# Patient Record
Sex: Female | Born: 1945
Health system: Southern US, Community
[De-identification: ages and names within clinical notes are randomized; demographics above are authoritative.]

## PROBLEM LIST (undated history)

## (undated) DIAGNOSIS — E663 Overweight: Secondary | ICD-10-CM

## (undated) DIAGNOSIS — Z780 Asymptomatic menopausal state: Secondary | ICD-10-CM

## (undated) DIAGNOSIS — R918 Other nonspecific abnormal finding of lung field: Secondary | ICD-10-CM

## (undated) DIAGNOSIS — K219 Gastro-esophageal reflux disease without esophagitis: Secondary | ICD-10-CM

## (undated) DIAGNOSIS — F419 Anxiety disorder, unspecified: Secondary | ICD-10-CM

## (undated) DIAGNOSIS — R7301 Impaired fasting glucose: Secondary | ICD-10-CM

## (undated) DIAGNOSIS — R7303 Prediabetes: Secondary | ICD-10-CM

## (undated) DIAGNOSIS — F99 Mental disorder, not otherwise specified: Secondary | ICD-10-CM

## (undated) DIAGNOSIS — K649 Unspecified hemorrhoids: Secondary | ICD-10-CM

## (undated) DIAGNOSIS — K5900 Constipation, unspecified: Secondary | ICD-10-CM

## (undated) DIAGNOSIS — R350 Frequency of micturition: Secondary | ICD-10-CM

## (undated) DIAGNOSIS — I10 Essential (primary) hypertension: Secondary | ICD-10-CM

## (undated) DIAGNOSIS — M779 Enthesopathy, unspecified: Secondary | ICD-10-CM

## (undated) DIAGNOSIS — F411 Generalized anxiety disorder: Secondary | ICD-10-CM

## (undated) DIAGNOSIS — E785 Hyperlipidemia, unspecified: Secondary | ICD-10-CM

## (undated) DIAGNOSIS — R319 Hematuria, unspecified: Secondary | ICD-10-CM

## (undated) DIAGNOSIS — I82409 Acute embolism and thrombosis of unspecified deep veins of unspecified lower extremity: Secondary | ICD-10-CM

## (undated) HISTORY — DX: Impaired fasting glucose: R73.01

## (undated) HISTORY — PX: ABDOMINAL HYSTERECTOMY: SHX81

## (undated) HISTORY — DX: Generalized anxiety disorder: F41.1

## (undated) HISTORY — DX: Acute embolism and thrombosis of unspecified deep veins of unspecified lower extremity: I82.409

## (undated) HISTORY — DX: Hyperlipidemia, unspecified: E78.5

## (undated) HISTORY — DX: Essential (primary) hypertension: I10

## (undated) HISTORY — PX: BREAST BIOPSY: SHX20

## (undated) HISTORY — DX: Unspecified hemorrhoids: K64.9

## (undated) HISTORY — DX: Constipation, unspecified: K59.00

## (undated) HISTORY — DX: Enthesopathy, unspecified: M77.9

## (undated) HISTORY — DX: Overweight: E66.3

## (undated) HISTORY — DX: Asymptomatic menopausal state: Z78.0

## (undated) HISTORY — DX: Frequency of micturition: R35.0

## (undated) HISTORY — DX: Other nonspecific abnormal finding of lung field: R91.8

## (undated) HISTORY — DX: Anxiety disorder, unspecified: F41.9

## (undated) HISTORY — DX: Hematuria, unspecified: R31.9

## (undated) HISTORY — PX: BREAST SURGERY: SHX581

---
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
## 1976-02-14 HISTORY — PX: BREAST EXCISIONAL BIOPSY: SUR124

## 2002-02-13 HISTORY — PX: COLONOSCOPY: SHX174

## 2005-04-03 ENCOUNTER — Ambulatory Visit: Payer: Self-pay | Admitting: Orthopedic Surgery

## 2007-01-25 ENCOUNTER — Encounter (HOSPITAL_COMMUNITY): Admission: RE | Admit: 2007-01-25 | Discharge: 2007-02-13 | Payer: Self-pay | Admitting: Family Medicine

## 2007-02-25 ENCOUNTER — Ambulatory Visit (HOSPITAL_COMMUNITY): Admission: RE | Admit: 2007-02-25 | Discharge: 2007-02-25 | Payer: Self-pay | Admitting: Family Medicine

## 2007-03-11 ENCOUNTER — Ambulatory Visit (HOSPITAL_COMMUNITY): Payer: Self-pay | Admitting: Oncology

## 2007-04-26 ENCOUNTER — Ambulatory Visit (HOSPITAL_COMMUNITY): Admission: RE | Admit: 2007-04-26 | Discharge: 2007-04-26 | Payer: Self-pay | Admitting: Family Medicine

## 2007-06-03 ENCOUNTER — Other Ambulatory Visit: Admission: RE | Admit: 2007-06-03 | Discharge: 2007-06-03 | Payer: Self-pay | Admitting: Obstetrics and Gynecology

## 2007-08-26 ENCOUNTER — Ambulatory Visit (HOSPITAL_COMMUNITY): Payer: Self-pay | Admitting: Oncology

## 2007-09-05 ENCOUNTER — Ambulatory Visit (HOSPITAL_COMMUNITY): Admission: RE | Admit: 2007-09-05 | Discharge: 2007-09-05 | Payer: Self-pay | Admitting: Family Medicine

## 2007-09-18 ENCOUNTER — Ambulatory Visit: Payer: Self-pay | Admitting: Gastroenterology

## 2008-02-11 ENCOUNTER — Ambulatory Visit (HOSPITAL_COMMUNITY): Payer: Self-pay | Admitting: Oncology

## 2008-02-11 ENCOUNTER — Encounter (HOSPITAL_COMMUNITY): Admission: RE | Admit: 2008-02-11 | Discharge: 2008-03-12 | Payer: Self-pay | Admitting: Oncology

## 2008-03-19 ENCOUNTER — Ambulatory Visit (HOSPITAL_COMMUNITY): Admission: RE | Admit: 2008-03-19 | Discharge: 2008-03-19 | Payer: Self-pay | Admitting: Family Medicine

## 2008-07-02 ENCOUNTER — Ambulatory Visit (HOSPITAL_COMMUNITY): Admission: RE | Admit: 2008-07-02 | Discharge: 2008-07-02 | Payer: Self-pay | Admitting: Family Medicine

## 2008-07-08 ENCOUNTER — Encounter (INDEPENDENT_AMBULATORY_CARE_PROVIDER_SITE_OTHER): Payer: Self-pay | Admitting: *Deleted

## 2008-08-04 ENCOUNTER — Telehealth (INDEPENDENT_AMBULATORY_CARE_PROVIDER_SITE_OTHER): Payer: Self-pay | Admitting: *Deleted

## 2008-08-04 ENCOUNTER — Ambulatory Visit: Payer: Self-pay | Admitting: Thoracic Surgery

## 2008-08-27 DIAGNOSIS — R634 Abnormal weight loss: Secondary | ICD-10-CM

## 2008-08-28 ENCOUNTER — Ambulatory Visit: Payer: Self-pay | Admitting: Gastroenterology

## 2008-08-28 DIAGNOSIS — R1013 Epigastric pain: Secondary | ICD-10-CM | POA: Insufficient documentation

## 2008-08-31 ENCOUNTER — Encounter: Payer: Self-pay | Admitting: Gastroenterology

## 2008-12-31 ENCOUNTER — Encounter (INDEPENDENT_AMBULATORY_CARE_PROVIDER_SITE_OTHER): Payer: Self-pay | Admitting: *Deleted

## 2009-02-10 ENCOUNTER — Ambulatory Visit: Payer: Self-pay | Admitting: Thoracic Surgery

## 2009-02-15 ENCOUNTER — Encounter (INDEPENDENT_AMBULATORY_CARE_PROVIDER_SITE_OTHER): Payer: Self-pay | Admitting: *Deleted

## 2009-02-16 ENCOUNTER — Encounter: Payer: Self-pay | Admitting: Family Medicine

## 2009-08-24 ENCOUNTER — Encounter: Admission: RE | Admit: 2009-08-24 | Discharge: 2009-08-24 | Payer: Self-pay | Admitting: Thoracic Surgery

## 2009-08-24 ENCOUNTER — Ambulatory Visit: Payer: Self-pay | Admitting: Thoracic Surgery

## 2009-08-26 ENCOUNTER — Encounter: Payer: Self-pay | Admitting: Gastroenterology

## 2009-10-13 ENCOUNTER — Ambulatory Visit: Payer: Self-pay | Admitting: Gastroenterology

## 2009-10-13 DIAGNOSIS — R143 Flatulence: Secondary | ICD-10-CM

## 2009-10-13 DIAGNOSIS — R142 Eructation: Secondary | ICD-10-CM

## 2009-10-13 DIAGNOSIS — R141 Gas pain: Secondary | ICD-10-CM | POA: Insufficient documentation

## 2010-01-19 ENCOUNTER — Encounter: Payer: Self-pay | Admitting: Cardiology

## 2010-02-15 ENCOUNTER — Ambulatory Visit (HOSPITAL_COMMUNITY)
Admission: RE | Admit: 2010-02-15 | Discharge: 2010-02-15 | Payer: Self-pay | Source: Home / Self Care | Attending: Internal Medicine | Admitting: Internal Medicine

## 2010-03-06 ENCOUNTER — Encounter: Payer: Self-pay | Admitting: Thoracic Surgery

## 2010-03-15 NOTE — Letter (Signed)
Summary: Recall Radiology  [facility]  [address]   [place], [place] [address]   Phone: [phone]  Fax: [phone]    December 31, 2008  Erin Wall [address] [place], [place]  [address] Jan 09, 1946   Dear Ms. Hartley,   Our office needs to get you scheduled for your Ultrasound. Please give our office a call to schedule this.  You may call the office at your convenience at [phone].  Please ask for the Referral Coordinator to make arrangements for this to be scheduled.  You may have to leave a message on our voice mail.  We will return your call.  If for any reason you do not wish to schedule this, please advise the office.  Please do not neglect your health.   Thank you,    Ave Filter  [facility] Ph: [phone]   Fax: [phone]

## 2010-03-15 NOTE — Letter (Signed)
Summary: External Other  External Other   Imported By: Peggyann Shoals 08/26/2009 15:41:51  _____________________________________________________________________  External Attachment:    Type:   Image     Comment:   External Document

## 2010-03-15 NOTE — Letter (Signed)
Summary: Historic Patient File  Historic Patient File   Imported By: Lind Guest 02/16/2009 08:49:59  _____________________________________________________________________  External Attachment:    Type:   Image     Comment:   External Document

## 2010-03-15 NOTE — Letter (Signed)
Summary: [facility] MEDICAL RECORDS  [facility]   Imported By: Faythe Ghee 01/19/2010 10:49:10  _____________________________________________________________________  External Attachment:    Type:   Image     Comment:   External Document

## 2010-03-15 NOTE — Assessment & Plan Note (Signed)
Summary: FLATULENCE   Visit Type:  Initial Consult Primary Care Provider:  [facility]  Chief Complaint:  gas/weight loss.  History of Present Illness: Woke up one AM w/o hips. Having diarrhea: soft stools. Once a day. Diet: eats fiber one and fruit. Needed to check with me. No nausea, vomiting, or problems swallowing. No BRBPR or bloating. Gas from below-never had it before but has more often than before. Appetite: good. Not working right now. Ate pork chops. Milk: SOY. ICE CREAM: eats sherbert. Rare cheese: 1x/mo. Never tried probiotics.   Current Medications (verified): 1)  Multi-Vitamin .... Take 1 Tablet By Mouth Once A Day 2)  Fish Oil .... Take 1 Tablet By Mouth Once A Day 3)  Tylenol .... Very Rarely  Allergies (verified): No Known Drug Allergies  Past History:  Past Medical History: Last updated: 08/28/2008 Anxiety Disorder TCS 2004  Family History: Reviewed history from 08/28/2008 and no changes required. No FH of Colon Cancer or polyps  Vital Signs:  Patient profile:   65 year old female Height:      71 inches Weight:      205 pounds BMI:     28.70 Temp:     98.2 degrees F oral Pulse rate:   80 / minute BP sitting:   140 / 84  (left arm) Cuff size:   regular  Physical Exam  General:  Well developed, well nourished, no acute distress. Head:  Normocephalic and atraumatic. Eyes:  PERRL, no icterus. Mouth:  No deformity or lesions. Neck:  Supple; no masses. Lungs:  Clear throughout to auscultation. Heart:  Regular rate and rhythm; no murmurs. Abdomen:  Soft, nontender and nondistended. Normal bowel sounds.  Impression & Recommendations:  Problem # 1:  FLATULENCE (ICD-787.3) Assessment New Most likely 2o to dietary choices: fiber, dairy, doubt SBBO. Gas and Flatulence prevention HO given. USE A PROBIOTIC DAILY FOR AT LEAST 3 MONTHS. Vear Clock' Colon Health, Digestive Advantage, Align or Restora. Follow up in 12 mos. Call me if flatulence is  associated with pain, bloating, or nausea. Will consider HBT for SBBO.  Problem # 2:  SCREENING, COLON CANCER (ICD-V76.51) Assessment: Comment Only TCS 2014  CC: PCP  Patient Instructions: 1)  USE A PROBIOTIC DAILY FOR AT LEAST 3 MONTHS. 2)  Vear Clock' Colon Health, Digestive Advantage, Align or Restora. 3)  Follow up in 12 mos. 4)  Call me if flatulence is associated with pain, bloating, or nausea. 5)  The medication list was reviewed and reconciled.  All changed / newly prescribed medications were explained.  A complete medication list was provided to the patient / caregiver.  Appended Document: Orders Update    Clinical Lists Changes  Orders: Added new Service order of Est. Patient Level III ([address]) - Signed      Appended Document: FLATULENCE 12 MONTH F/U OPV IS IN THE COMPUTER

## 2010-03-15 NOTE — Letter (Signed)
Summary: Appointment Reminder  [facility]  [address]   [place], [place] [address]   Phone: [phone]  Fax: [phone]       February 15, 2009   Erin Wall [address] [place], [place]  [address] 19-Mar-1945    Dear [name],  We have been unable to reach you by phone to schedule a follow up   appointment that was recommended for you by Dr. Darrick Penna. It is very   important that we reach you to schedule an appointment. We hope that you  allow [place] to participate in your health care needs. Please contact us at  [phone] at your earliest convenience to schedule your appointment.  Sincerely,    [facility] R. Roetta Sessions, M.D.    Kassie Mends, M.D. Lorenza Burton, FNP-BC    Tana Coast, PA-C Phone: [phone]    Fax: [phone]

## 2010-05-24 ENCOUNTER — Ambulatory Visit: Payer: BC Managed Care – PPO | Admitting: Thoracic Surgery

## 2010-06-28 NOTE — Assessment & Plan Note (Signed)
NAME:  Erin Wall, Erin Wall                CHART#:  [id number]   DATE:  09/18/2007                       DOB:  06-Sep-1945   REFERRING PHYSICIAN:  Ladona Horns. Mariel Sleet, MD   PRIMARY PHYSICIAN:  Patrica Duel, MD   REASON FOR CONSULTATION:  Weight loss.   HISTORY OF PRESENT ILLNESS:  The patient is a 65 year old female who  changed her diet in January 2009.  She stopped eating red meat.  She  states she started going to Dr. Nobie Putnam in January 2009 because she was  losing weight.  She has a significant past medical history of  colonoscopy in 2004, which showed no evidence of polyps.  Her workup for  her weight loss has included a normal cortisol level in July of 2009,  normal hemoglobin A1c in June of 2009, stable hemoglobin at 13 in June  of 2009 (hemoglobin 13 in January of 2009), a normal creatinine and  hepatic function panel, and TSH.  She was seen and evaluated by Dr.  Mariel Sleet in July of 2009 because of an increased percentage of  lymphocytes on her peripheral smear.  No additional workup was ordered.  She denies any black stool, heartburn, indigestion, nausea, vomiting,  abdominal pain, or problems swallowing.  Bing cherries give her loose  stool.  She occasionally has constipation.  She has had no change in her  bowel habits.  Her maximum lifetime weight is 220 pounds.  She is  concerned because the people keep telling her she looks sick.  She  denies any aspirin use, BCs, Goody powders, ibuprofen, or Aleve.  The  skin is sensitive on her anterior chest.  She denies any headaches or  joint pain.  She rarely has abdominal pain.  She denies any early  satiety.  Her appetite is good.  Food does not seem to cause any  symptoms.   PAST MEDICAL HISTORY:  Left renal cyst.   PAST SURGICAL HISTORY:  Hysterectomy due to fibroids, which are causing  pain and bleeding.   ALLERGIES:  No known drug allergies.   MEDICATIONS:  None.   FAMILY HISTORY:  She has no family history of colon cancer  or colon  polyps.  She has a family history of diabetes.   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  She is single and does not  have any children.  She denies any tobacco or alcohol use.   PHYSICAL EXAMINATION:  VITAL SIGNS:  Weight 200 pounds, height 5 feet 11  inches, BMI 27.9 (slightly overweight), temperature 97.7, blood pressure  120/84, and pulse 72.  GENERAL:  She is in no apparent distress.  Alert and oriented x4. HEENT:  Atraumatic and normocephalic.  Pupils equal and reactive to light.  Mouth:  No oral lesions.  Posterior pharynx without erythema or exudate.  The sclerae are anicteric bilaterally.NECK:  Has full range of motion  and no lymphadenopathy.LUNGS:  Clear to auscultation  bilaterally.CARDIOVASCULAR:  Regular rhythm.  No murmur.  Normal S1 and  S2.ABDOMEN:  Bowel sounds are present, soft, nontender, and  nondistended.  No rebound or guarding.EXTREMITIES:  No cyanosis,  clubbing, or edema.NEURO:  She has no focal neurologic deficits.   RADIOGRAPHIC STUDIES:  CT scan of the abdomen and pelvis with and  without contrast performed in September of 2008 for hematuria revealed a  benign cortical cyst in the right kidney.  Liver, pancreas, and spleen  were unremarkable.  She had no upper abdominal or retroperitoneal mass,  adenoma, or aneurysm.  She had no bowel, mesenteric, pelvic, or inguinal  mass adenopathy or inflammatory process.  She had no evidence of  appendicitis, diverticulitis, hernia, or bowel obstruction.  She had a  chest and a pelvis film in January of 2009, which showed degenerative  changes in the lower lumbar spine.  She had a bone scan in December of  2008, which showed increased activity in the lumbar spine in the right  SI joint.   ASSESSMENT:  The patient is a 65 year old female who has no GI symptoms.  She has no alarm symptoms.  I believe that  her weight loss is results  of a  change in her diet.  She has had an extensive evaluation and no  etiology for her weight loss has been identified except change in her  diet.  She has no indication for an upper endoscopy or repeat  colonoscopy.  Thank you for allowing me to see the patient in  consultation.  My recommendations follow.   RECOMMENDATIONS:  1. She may follow up with me as needed.  2. I explained to her that her body mass index is still 27 and that      200 pounds is actually not a bad weight for her.  She should not be      220 pounds.  I reassured her that she has had an extensive      evaluation, which has revealed no etiology other than her change in      her diet for the weight loss.       Kassie Mends, M.D.  Electronically Signed     SM/MEDQ  D:  09/18/2007  T:  09/19/2007  Job:  [id number]   cc:   Patrica Duel, M.D.  Ladona Horns. Mariel Sleet, MD

## 2010-06-28 NOTE — Letter (Signed)
August 04, 2008   Patrica Duel, MD  [address]  [place], [place] [address]   Re:  ARLYN, BUMPUS               DOB:  [birth year]   Dear Dr. Nobie Putnam:   I appreciate the opportunity of seeing the patient.  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  A followup CT scan when she was  having breast pain and abdominal pain showed multiple small 4-mm and one  6-mm nodules, which was really no change since her previous one,  although there was a slight decrease in number.  She has no history of  fever, chills, or excessive sputum.  She is a nonsmoker.  She had a  previous partial hysterectomy.  Her CT scan showed some vague area in  her right breast.  She had a mammogram apparently 2 months ago that was  by her report normal.  There was no pelvic or abdominal abnormalities  from her CT scan.   Her medications include Lortab 5 mg.  She has no allergies.  Her family  history is noncontributory.   SOCIAL HISTORY:  Single.  She is a retired Runner, broadcasting/film/video.  Does not smoke and  does not drink alcohol.   REVIEW OF SYSTEMS:  She is 5 feet 11.  She is 198 pounds.  General:  Some weight loss.  Cardiac:  No angina or atrial fibrillation.  Pulmonary:  See history of present illness.  GI:  Abdominal pain.  GU:  She has a cyst on her kidneys and no kidney disease or dysuria.  Vascular:  No claudication, DVT, or TIAs.  Neurological:  No dizziness,  headaches, blackouts, or seizures.  Musculoskeletal:  No arthritis or  joint pain.  Psychiatric:  No depression or nervousness.  Eyes/ENT:  No  changes in eyesight or hearing.  Hematological:  No problems with  bleeding, clotting disorders, or anemia.   PHYSICAL EXAMINATION:  VITAL SIGNS:  His blood pressure is 147/95, pulse  76, respirations 16, and saturations were 98%.  HEAD, EYES, EARS, NOSE, AND THROAT:  Unremarkable.  NECK:  Supple without thyromegaly.   There is no supraclavicular or  axillary adenopathy.  CHEST:  Clear to auscultation and percussion.  HEART:  Regular sinus rhythm.  No murmurs.  ABDOMEN:  Soft.  There is no hepatosplenomegaly.  EXTREMITIES:  Pulses are 2+.  There is no clubbing or edema.  NEUROLOGIC:  She is oriented x3.  Sensory and motor intact.  Cranial  nerves intact.   I looked at the nodules and they are just too small to do anything other  than following with PET scans.  I have suggested that she get another  CAT scan in 6 months.  I told her that she probably follow up as far as  her breast abnormalities and her breast pain.  I think these are  probably just granulomas or I doubt whether other inflammatory response.  I doubt sarcoidosis.  I will see her again as mentioned in 6 months.   Ines Bloomer, M.D.  Electronically Signed   DPB/MEDQ  D:  08/04/2008  T:  08/05/2008  Job:  [id number]

## 2010-06-28 NOTE — Letter (Signed)
February 10, 2009   Patrica Duel, MD  [address]  [place], [place] [address]   Re:  RHYANN, BERTON               DOB:  1945-03-08   Dear Loraine Leriche,   I saw the patient back today.  Apparently, her weight now is 192 and we  got 198 last year but of course difference scales.  She still complains  of weight loss and is worried that she has cancer.  She saw Dr. Hyacinth Meeker  at [facility] and he said that she does not have cancer.  Her CT scan shows  that the multiple nodules really had not changed.  There is one area in  the superior segment of the right lower lobe.  Apparently, there is a  little change in the previous CTs and they want [place] to watch closely.  From my standpoint, I do not think there is any evidence of any further  workup other than another CT scan in 6 months.  Her blood pressure was  146/82, pulse 90, respirations 18, sats were 90%.  Lungs are clear to  auscultation and percussion.  There is no supraclavicular or axillary  adenopathy.   Ines Bloomer, M.D.  Electronically Signed   DPB/MEDQ  D:  02/10/2009  T:  02/11/2009  Job:  [id number]

## 2010-06-28 NOTE — Letter (Signed)
August 24, 2009   Patrica Duel, M.D.  [address]  [place], [place] [address]   Re:  NATHALIE, CAVENDISH               DOB:  09/13/1945   Dear Dr. Nobie Putnam:   The patient came today.  Her weight is relatively stable.  Her blood  pressure is 160/100, pulse 66, respirations 18, saturations were 98%.  We got a CT scan of her chest and abdomen, although I do not have an  official reading at least the chest looks the same today, the last time  with some multiple tiny small nodules that have not changed.  We will  see her back again in 9 months with another CT scan.  If there is any  change in the official reading, we will let her know.  Dr. Aldean Ast  apparently will talk to her about her abdominal findings.   Ines Bloomer, M.D.  Electronically Signed   DPB/MEDQ  D:  08/24/2009  T:  08/25/2009  Job:  [id number]   cc:   Courtney Paris, M.D.

## 2010-08-16 ENCOUNTER — Ambulatory Visit: Payer: BC Managed Care – PPO | Admitting: Gastroenterology

## 2010-08-24 ENCOUNTER — Encounter: Payer: Self-pay | Admitting: Gastroenterology

## 2010-08-24 ENCOUNTER — Ambulatory Visit (INDEPENDENT_AMBULATORY_CARE_PROVIDER_SITE_OTHER): Payer: BC Managed Care – PPO | Admitting: Gastroenterology

## 2010-08-24 VITALS — BP 142/82 | HR 80 | Temp 96.0°F | Ht 71.5 in | Wt 197.4 lb

## 2010-08-24 DIAGNOSIS — R634 Abnormal weight loss: Secondary | ICD-10-CM | POA: Insufficient documentation

## 2010-08-24 NOTE — Assessment & Plan Note (Addendum)
Pt has had unintentional weight loss. Most likely 2o to dietary changes. Differential diagnosis includes a low likelihood of gastric or colon CA.  Last CT w/ and w/o contrast JUL 2011-NAIAP  TCS/? EGD in July 2012-MOVIPREP SPLIT DOSING. OPV IN 6 MOS.

## 2010-08-24 NOTE — Progress Notes (Signed)
  Subjective:    Patient ID: Erin Wall, female    DOB: 1945-07-05, 65 y.o.   MRN: [id number]  PCP: Gerda Diss  HPI C/o weight loss and gas. Concerned she has cancer. Had blood work from Dr. Gerda Diss. Pt concerned her tiny veins may interfere with the test. Occasionally cheats and has ice cream and has more gas. Eats a high fiber diet. Unintentional weight loss: 8 lbs. No blood in her stools. No black tarry stools. Occasionally has sml amount of residual stool on tissue. No NV, problems swallowing, change in bowel habits. Appetite is good. Takes baby ASA on occasion. Rare wine. No BC/Goody's, Ibuprofen, or Aleve.  Past Medical History  Diagnosis Date  . Anxiety   . Hyperlipemia   . Overweight (BMI 25.0-29.9) AUG 2011 205 lbs     Past Surgical History  Procedure Date  . Colonoscopy 2004    No Known Allergies  Current Outpatient Prescriptions  Medication Sig Dispense Refill  . CINNAMON PO Take by mouth. One tablet daily       . fish oil-omega-3 fatty acids 1000 MG capsule Take 1 g by mouth daily.        . Multiple Vitamins-Minerals (MULTIVITAMIN WITH MINERALS) tablet Take 1 tablet by mouth daily.        . pravastatin (PRAVACHOL) 20 MG tablet Take 20 mg by mouth daily.         Family History  Problem Relation Age of Onset  . Colon cancer Neg Hx   . Colon polyps Neg Hx    History   Social History  . Marital Status: Single    Spouse Name: N/A    Number of Children: N/A  . Years of Education: N/A   Occupational History  . RETIRED TEACHER   Social History Main Topics  . Smoking status: Never Smoker   .    Marland Kitchen Alcohol Use: No  .    .        Review of Systems  All other systems reviewed and are negative.       Objective:   Physical Exam  Vitals reviewed. Constitutional: She is oriented to person, place, and time. She appears well-developed and well-nourished.  HENT:  Head: Normocephalic and atraumatic.  Mouth/Throat: No oropharyngeal exudate.  Eyes: Pupils are  equal, round, and reactive to light. No scleral icterus.  Neck: Normal range of motion. Neck supple.  Cardiovascular: Normal rate, regular rhythm and normal heart sounds.   Pulmonary/Chest: Effort normal and breath sounds normal.  Abdominal: Soft. Bowel sounds are normal. She exhibits no distension. There is no tenderness.  Musculoskeletal: She exhibits no edema.  Lymphadenopathy:    She has no cervical adenopathy.  Neurological: She is alert and oriented to person, place, and time.  Skin: No rash noted.  Psychiatric:       ANXIOUS MOOD, DISTORTED BODY IMAGE PERCEPTION          Assessment & Plan:

## 2010-08-24 NOTE — Progress Notes (Signed)
Cc to Dr. Gerda Diss

## 2010-08-26 ENCOUNTER — Telehealth: Payer: Self-pay

## 2010-08-26 NOTE — Telephone Encounter (Signed)
Pt called and said she has had second thoughts about the EGD/TCS. Wants to cancel appt on 08/29/2010 and will call back to reschedule when she is ready. Cancelled and informed Kim in Endo.

## 2010-08-27 NOTE — Telephone Encounter (Signed)
noted 

## 2010-08-29 ENCOUNTER — Ambulatory Visit (HOSPITAL_COMMUNITY)
Admission: RE | Admit: 2010-08-29 | Payer: BC Managed Care – PPO | Source: Ambulatory Visit | Admitting: Gastroenterology

## 2010-08-29 ENCOUNTER — Encounter: Payer: BC Managed Care – PPO | Admitting: Gastroenterology

## 2010-08-29 ENCOUNTER — Encounter (HOSPITAL_COMMUNITY): Admission: RE | Payer: Self-pay | Source: Ambulatory Visit

## 2010-08-29 SURGERY — COLONOSCOPY
Anesthesia: Moderate Sedation

## 2010-09-05 ENCOUNTER — Telehealth: Payer: Self-pay

## 2010-09-05 NOTE — Telephone Encounter (Signed)
Pt called to reschedule her EGD/TCS that she had cancelled on 08/29/2010. I scheduled for 09/13/2010 @ 11:00 AM. She had turned in all paper work and prescription she said. I am leaving new instructions and Rx at front desk for her to pick up. LMOM for Kim.

## 2010-09-06 NOTE — Progress Notes (Signed)
Reminder in epic to follow up in 6 months °

## 2010-09-12 ENCOUNTER — Telehealth: Payer: Self-pay

## 2010-09-12 MED ORDER — SODIUM CHLORIDE 0.45 % IV SOLN
Freq: Once | INTRAVENOUS | Status: AC
  Administered 2010-09-20: 11:00:00 via INTRAVENOUS

## 2010-09-12 NOTE — Telephone Encounter (Signed)
Pt informed. She wanted to get her prep back ( she had been given one) but per [place], it had been discarded because we cannot give to anyone else. I faxed prescription and her instructions to [facility].

## 2010-09-12 NOTE — Telephone Encounter (Signed)
Please call pt. I do not recommend virtual colonoscopy. It's not usually covered by insurance, and it misses polyps less than 5-10 mm. It also have to take a bowel prep, but you don't get sedation. You can have cramping as well. Virtual colonoscopy should only be used if you are nor a good candidate for a real colonoscopy.

## 2010-09-12 NOTE — Telephone Encounter (Signed)
Pt called. Is scheduled for a colonoscopy tomorrow. Hasn't picked up prep yet. Wants to know if Dr. Darrick Penna would recommend a virtual colonoscopy versus the standard colonoscopy. She said her sister had one and she is interested if Dr. Darrick Penna would recommend.

## 2010-09-13 ENCOUNTER — Telehealth: Payer: Self-pay

## 2010-09-13 ENCOUNTER — Encounter: Payer: BC Managed Care – PPO | Admitting: Gastroenterology

## 2010-09-13 NOTE — Telephone Encounter (Signed)
Pt came by and said that [facility] did not get the Rx fax and instructions that I faxed yesterday.   I called and LMOM for Selena Batten that pt had to cancel. She wants to do this week if possible. I did not know where to schedule the way the schedule looked. Pt was given Rx and instructions to just change the date whenever she gets appt. She was very apologetic and said let Dr. Darrick Penna know, because she had been so undecisive. She did say she wanted to discuss the EGD with Dr. Darrick Penna morning of procedure, she has some questions about that. I will call pt with the time

## 2010-09-13 NOTE — Telephone Encounter (Signed)
Noted  

## 2010-09-13 NOTE — Telephone Encounter (Signed)
Spoke with [employer]. She said we can reschedule pt for 09/20/2010 @ 10:30 AM and pt will need to be at short stay to register at 9:30 AM. I called pt and informed her. I asked her had she picked up the prep and she said no, that is another thing I wanted to find out. Why did Dr. Darrick Penna order the Movie Prep instead of another prep. I told her that it was one of Dr. Darrick Penna favorites for patients who do not have a lot of medical problems, and she said OK. I reviewed the info and times for her to do the prep since it is split. She will do the first part at 5:00 PM on 09/19/2010. She will do the second part on 09/20/2010 ( morning of procedure) at 5:30 AM. She expressed understanding.

## 2010-09-13 NOTE — Telephone Encounter (Signed)
Pt has rsc'd twice. If she is NS for 3rd time, pt will need endoscopy at another facility.

## 2010-09-20 ENCOUNTER — Encounter (HOSPITAL_COMMUNITY)
Admission: RE | Disposition: A | Discharge status: Home / Self Care | Payer: Self-pay | Source: Ambulatory Visit | Attending: Gastroenterology

## 2010-09-20 ENCOUNTER — Encounter (HOSPITAL_COMMUNITY): Payer: Self-pay | Admitting: *Deleted

## 2010-09-20 ENCOUNTER — Encounter: Payer: BC Managed Care – PPO | Admitting: Gastroenterology

## 2010-09-20 ENCOUNTER — Other Ambulatory Visit: Payer: Self-pay | Admitting: Gastroenterology

## 2010-09-20 ENCOUNTER — Ambulatory Visit (HOSPITAL_COMMUNITY)
Admission: RE | Admit: 2010-09-20 | Discharge: 2010-09-20 | Disposition: A | Discharge status: Home / Self Care | Payer: BC Managed Care – PPO | Source: Ambulatory Visit | Attending: Gastroenterology | Admitting: Gastroenterology

## 2010-09-20 DIAGNOSIS — E785 Hyperlipidemia, unspecified: Secondary | ICD-10-CM | POA: Insufficient documentation

## 2010-09-20 DIAGNOSIS — K620 Anal polyp: Secondary | ICD-10-CM

## 2010-09-20 DIAGNOSIS — K222 Esophageal obstruction: Secondary | ICD-10-CM

## 2010-09-20 DIAGNOSIS — K621 Rectal polyp: Secondary | ICD-10-CM

## 2010-09-20 DIAGNOSIS — K2970 Gastritis, unspecified, without bleeding: Secondary | ICD-10-CM

## 2010-09-20 DIAGNOSIS — D126 Benign neoplasm of colon, unspecified: Secondary | ICD-10-CM

## 2010-09-20 DIAGNOSIS — K2990 Gastroduodenitis, unspecified, without bleeding: Secondary | ICD-10-CM | POA: Insufficient documentation

## 2010-09-20 DIAGNOSIS — D128 Benign neoplasm of rectum: Secondary | ICD-10-CM | POA: Insufficient documentation

## 2010-09-20 DIAGNOSIS — R634 Abnormal weight loss: Secondary | ICD-10-CM

## 2010-09-20 DIAGNOSIS — K648 Other hemorrhoids: Secondary | ICD-10-CM | POA: Insufficient documentation

## 2010-09-20 HISTORY — PX: ESOPHAGOGASTRODUODENOSCOPY: SHX5428

## 2010-09-20 HISTORY — PX: COLONOSCOPY: SHX5424

## 2010-09-20 SURGERY — COLONOSCOPY
Anesthesia: Moderate Sedation

## 2010-09-20 MED ORDER — PROMETHAZINE HCL 25 MG/ML IJ SOLN
INTRAMUSCULAR | Status: AC
  Filled 2010-09-20: qty 1

## 2010-09-20 MED ORDER — MIDAZOLAM HCL 5 MG/5ML IJ SOLN
INTRAMUSCULAR | Status: DC | PRN
  Administered 2010-09-20: 2 mg via INTRAVENOUS
  Administered 2010-09-20: 1 mg via INTRAVENOUS
  Administered 2010-09-20: 2 mg via INTRAVENOUS
  Administered 2010-09-20: 1 mg via INTRAVENOUS

## 2010-09-20 MED ORDER — PROMETHAZINE HCL 25 MG/ML IJ SOLN
INTRAMUSCULAR | Status: DC | PRN
  Administered 2010-09-20: 12.5 mg via INTRAVENOUS

## 2010-09-20 MED ORDER — MIDAZOLAM HCL 5 MG/5ML IJ SOLN
INTRAMUSCULAR | Status: AC
  Filled 2010-09-20: qty 10

## 2010-09-20 MED ORDER — OMEPRAZOLE 20 MG PO CPDR: DELAYED_RELEASE_CAPSULE | ORAL | Status: DC

## 2010-09-20 MED ORDER — MEPERIDINE HCL 100 MG/ML IJ SOLN
INTRAMUSCULAR | Status: AC
  Filled 2010-09-20: qty 2

## 2010-09-20 MED ORDER — MEPERIDINE HCL 100 MG/ML IJ SOLN
INTRAMUSCULAR | Status: DC | PRN
  Administered 2010-09-20 (×2): 50 mg via INTRAVENOUS

## 2010-09-20 NOTE — H&P (Signed)
  IN [facility] 7/11

## 2010-09-20 NOTE — Interval H&P Note (Signed)
History and Physical Interval Note:   09/20/2010   11:17 AM   Erin Wall  has presented today for surgery, with the diagnosis of wt.loss  The various methods of treatment have been discussed with the patient and family. After consideration of risks, benefits and other options for treatment, the patient has consented to  Procedure(s): COLONOSCOPY ESOPHAGOGASTRODUODENOSCOPY (EGD) as a surgical intervention .  I have reviewed the patients' chart and labs.  Questions were answered to the patient's satisfaction.     Jonette Eva  MD

## 2010-09-22 ENCOUNTER — Telehealth: Payer: Self-pay | Admitting: Gastroenterology

## 2010-09-22 NOTE — Telephone Encounter (Signed)
Results Cc to PCP  

## 2010-09-22 NOTE — Telephone Encounter (Signed)
Please call pt. She had simple adenomas removed from her colon. TCS in 10 years. High fiber diet. Her stomach Bx shows mild gastritis. Continue OMP 30 minutes prior to meals. Follow up Jan 2013-E:30 visit.

## 2010-09-26 NOTE — Telephone Encounter (Signed)
Routed to DS 

## 2010-09-26 NOTE — Telephone Encounter (Signed)
Reminder in epic to follow up in Jan 2013 and to have tcs in 10 yrs

## 2010-09-26 NOTE — Telephone Encounter (Signed)
Pt informed

## 2010-09-28 ENCOUNTER — Encounter (HOSPITAL_COMMUNITY): Payer: Self-pay | Admitting: Gastroenterology

## 2010-10-27 ENCOUNTER — Encounter: Payer: Self-pay | Admitting: Gastroenterology

## 2010-11-18 LAB — DIFFERENTIAL
Basophils Absolute: 0 10*3/uL (ref 0.0–0.1)
Basophils Relative: 1 % (ref 0–1)
Eosinophils Absolute: 0.1 10*3/uL (ref 0.0–0.7)
Monocytes Absolute: 0.3 10*3/uL (ref 0.1–1.0)
Neutro Abs: 2.6 10*3/uL (ref 1.7–7.7)
Neutrophils Relative %: 45 % (ref 43–77)

## 2010-11-18 LAB — COMPREHENSIVE METABOLIC PANEL
ALT: 30 U/L (ref 0–35)
Albumin: 3.6 g/dL (ref 3.5–5.2)
Alkaline Phosphatase: 55 U/L (ref 39–117)
BUN: 10 mg/dL (ref 6–23)
Chloride: 107 mEq/L (ref 96–112)
Glucose, Bld: 102 mg/dL — ABNORMAL HIGH (ref 70–99)
Potassium: 4 mEq/L (ref 3.5–5.1)
Total Bilirubin: 0.6 mg/dL (ref 0.3–1.2)

## 2010-11-18 LAB — CBC
HCT: 42.1 % (ref 36.0–46.0)
Hemoglobin: 13.9 g/dL (ref 12.0–15.0)
WBC: 5.8 10*3/uL (ref 4.0–10.5)

## 2011-02-01 ENCOUNTER — Encounter: Payer: Self-pay | Admitting: Gastroenterology

## 2011-05-22 ENCOUNTER — Telehealth (HOSPITAL_COMMUNITY): Payer: Self-pay | Admitting: Oncology

## 2011-12-12 ENCOUNTER — Encounter: Payer: Self-pay | Admitting: Gastroenterology

## 2011-12-13 ENCOUNTER — Encounter: Payer: Self-pay | Admitting: Gastroenterology

## 2011-12-13 ENCOUNTER — Ambulatory Visit (INDEPENDENT_AMBULATORY_CARE_PROVIDER_SITE_OTHER): Payer: Medicare Other | Admitting: Gastroenterology

## 2011-12-13 ENCOUNTER — Other Ambulatory Visit: Payer: Self-pay | Admitting: Gastroenterology

## 2011-12-13 VITALS — BP 134/78 | HR 83 | Temp 98.5°F | Ht 71.0 in | Wt 197.0 lb

## 2011-12-13 DIAGNOSIS — R143 Flatulence: Secondary | ICD-10-CM

## 2011-12-13 DIAGNOSIS — R141 Gas pain: Secondary | ICD-10-CM

## 2011-12-13 DIAGNOSIS — R197 Diarrhea, unspecified: Secondary | ICD-10-CM

## 2011-12-13 NOTE — Patient Instructions (Signed)
START PROBIOTIC DAILY.  COMPLETE HYDROGEN BREATH TEST.  CONTINUE PRILOSEC.  FOLLOW UP IN 2 MOS.

## 2011-12-13 NOTE — Progress Notes (Signed)
  Subjective:    Patient ID: Erin Wall, female    DOB: June 01, 1945, 66 y.o.   MRN: [id number]  PCP: Gerda Diss  HPI CONCERNED ABOUT GAS. ONLY TAKING PREVACID. EVERYTHING SHE EATS GIVES HER GAS. PASSING BELOW AND FEELS GAS IN HER VAGINA. NO CHILDREN. WEIGHT SAME. NOT TAKING PROBIOTIC. PRETTY MUCH AVOIDING DAIRY PRODUCTS. SILK FOR BREAKFAST. DRINKING MORE WATER. ICE CREAM & SODA CAUSES LOTS OF GAS. BM: EVERY DAY TO EVERY OTHER OTHER DAY(NL). GAS NOT ASSOCIATED WITH ABD PAIN. CAN HAVE PAIN IN RIGHT FLANK. NO NAUSEA, VOMITING, OR LOSS OF APPETITE.   Past Medical History  Diagnosis Date  . Anxiety   . Hyperlipemia   . Overweight (BMI 25.0-29.9) AUG 2011 205 lbs     Past Surgical History  Procedure Date  . Colonoscopy 2004  . Abdominal hysterectomy   . Colonoscopy 09/20/2010    MWN:[id number] polyps,internal hemorrhoids/polypoid lesion in the cecum  . Esophagogastroduodenoscopy 09/20/2010    YQI:[id number]/LOVF gastritis   Allergies  Allergen Reactions  . Pravachol Other (See Comments)    Muscle aches   Current Outpatient Prescriptions  Medication Sig Dispense Refill  . CINNAMON PO Take by mouth. One tablet daily       . fish oil-omega-3 fatty acids 1000 MG capsule Take 1 g by mouth daily.        . Multiple Vitamins-Minerals (MULTIVITAMIN WITH MINERALS) tablet Take 1 tablet by mouth daily.        Marland Kitchen omeprazole (PRILOSEC) 20 MG capsule Take 20 mg by mouth daily. 1 po 30 MINUTES PRIOR TO YOUR FIRST MEAL      . pravastatin (PRAVACHOL) 20 MG tablet Take 20 mg by mouth daily.        .          Review of Systems     Objective:   Physical Exam  Vitals reviewed. Constitutional: She is oriented to person, place, and time. She appears well-nourished. No distress.  HENT:  Head: Normocephalic and atraumatic.  Mouth/Throat: Oropharynx is clear and moist. No oropharyngeal exudate.  Eyes: Pupils are equal, round, and reactive to light. No scleral icterus.  Neck: Normal range of motion. Neck  supple.  Cardiovascular: Normal rate, regular rhythm and normal heart sounds.   Pulmonary/Chest: Effort normal and breath sounds normal. No respiratory distress.  Abdominal: Soft. Bowel sounds are normal. She exhibits no distension. There is no tenderness.  Musculoskeletal: She exhibits no edema.  Neurological: She is alert and oriented to person, place, and time.       NO FOCAL DEFICITS   Psychiatric:       FLAT AFFECT, SLIGHTLY ANXIOUS MOOD          Assessment & Plan:

## 2011-12-13 NOTE — Assessment & Plan Note (Addendum)
MOST LIKELY DUE TO INADVERTENT LACTOSE CONSUMPTION. DIFFERENTIAL DIAGNOSIS INCLUDES SMALL INTESTINE BACTERIAL OVERGROWTH(SIBO).   DISCUSSED MANAGEMENT OPTIONS & REASSURED PT THAT GAS DOES NTO INDICATE A SERIOUS ILLNESS. HYDROGEN BREATH TEST FOR SIBO ADD ALIGN DAILY CONTINUE SILK & AVOID IN DAIRY OPV IN 2 MOS.

## 2011-12-13 NOTE — Progress Notes (Signed)
Faxed to PCP

## 2011-12-18 ENCOUNTER — Encounter (HOSPITAL_COMMUNITY): Payer: Self-pay | Admitting: Pharmacy Technician

## 2011-12-25 ENCOUNTER — Telehealth: Payer: Self-pay | Admitting: Gastroenterology

## 2011-12-25 NOTE — Telephone Encounter (Signed)
Patient called and R/S her HBT again to Monday Nov 18th at 7:30 AM

## 2011-12-26 NOTE — Telephone Encounter (Signed)
REVIEWED.  

## 2012-01-01 ENCOUNTER — Ambulatory Visit (HOSPITAL_COMMUNITY)
Admission: RE | Admit: 2012-01-01 | Discharge: 2012-01-01 | Disposition: A | Discharge status: Home / Self Care | Payer: Medicare Other | Source: Ambulatory Visit | Attending: Gastroenterology | Admitting: Gastroenterology

## 2012-01-01 ENCOUNTER — Encounter (HOSPITAL_COMMUNITY): Payer: Self-pay | Admitting: *Deleted

## 2012-01-01 ENCOUNTER — Encounter (HOSPITAL_COMMUNITY)
Admission: RE | Disposition: A | Discharge status: Home / Self Care | Payer: Self-pay | Source: Ambulatory Visit | Attending: Gastroenterology

## 2012-01-01 DIAGNOSIS — R142 Eructation: Secondary | ICD-10-CM

## 2012-01-01 DIAGNOSIS — R197 Diarrhea, unspecified: Secondary | ICD-10-CM | POA: Insufficient documentation

## 2012-01-01 DIAGNOSIS — R143 Flatulence: Secondary | ICD-10-CM

## 2012-01-01 DIAGNOSIS — R141 Gas pain: Secondary | ICD-10-CM

## 2012-01-01 HISTORY — DX: Prediabetes: R73.03

## 2012-01-01 HISTORY — DX: Gastro-esophageal reflux disease without esophagitis: K21.9

## 2012-01-01 HISTORY — PX: BACTERIAL OVERGROWTH TEST: SHX5739

## 2012-01-01 SURGERY — BREATH TEST, FOR INTESTINAL BACTERIAL OVERGROWTH

## 2012-01-01 MED ORDER — LACTULOSE 10 GM/15ML PO SOLN
ORAL | Status: AC
  Filled 2012-01-01: qty 60

## 2012-01-01 NOTE — Progress Notes (Signed)
No beans, bran or high fiber cereal the day before the procedure? no NPO except for water 12 hours before procedure? no No smoking, sleeping or vigorous exercising for at least 30 before procedure? no Recent antibiotic use and/or diarrhea? no   If yes, physician notified.  Time Baseline 15 mins 30 mins 45 mins 60 mins 75 mins 90 mins 105 mins 120 mins 135 min 150 mins 165 mins 180 mins  H2-ppm 1 n/a 1 1 1 1 4 10 5  10

## 2012-01-03 ENCOUNTER — Encounter (HOSPITAL_COMMUNITY): Payer: Self-pay | Admitting: Gastroenterology

## 2012-01-09 ENCOUNTER — Encounter: Payer: Self-pay | Admitting: Gastroenterology

## 2012-01-09 NOTE — Progress Notes (Signed)
Pt is aware of OV on 02/22/12 at 0830 with SF and appt card was mailed

## 2012-02-22 ENCOUNTER — Encounter: Payer: Self-pay | Admitting: Gastroenterology

## 2012-02-22 ENCOUNTER — Ambulatory Visit (INDEPENDENT_AMBULATORY_CARE_PROVIDER_SITE_OTHER): Payer: Medicare Other | Admitting: Gastroenterology

## 2012-02-22 VITALS — BP 133/69 | HR 84 | Temp 97.5°F | Ht 71.0 in | Wt 196.0 lb

## 2012-02-22 DIAGNOSIS — R142 Eructation: Secondary | ICD-10-CM

## 2012-02-22 DIAGNOSIS — R141 Gas pain: Secondary | ICD-10-CM

## 2012-02-22 NOTE — Assessment & Plan Note (Addendum)
MOST LIKELY DUE TO FUNCTIONAL GUT DISORDER. NO ALARM SX/SIGNS.  Continue ALIGN DAILY forever.  AVOID FOOD ITEMS THAT MAY CAUSE BLOATING AND GAS.  Follow up in 6 mos.

## 2012-02-22 NOTE — Patient Instructions (Signed)
YOU DO NOT HAVE CANCER. YOUR WEIGHT HAS BEEN 192-197 LBS SINCE July 2012. YOU WORKUP SINCE 2011-2012 HAS INCLUDED A CT OF THE CHEST/ABDOMEN/PELVIS, AND UPPER AND LOWER ENDOSCOPY.    Continue ALIGN DAILY forever.  AVOID FOOD ITEMS THAT MAY CAUSE BLAOTING AND GAS.  Follow up in 6 mos.

## 2012-02-22 NOTE — Progress Notes (Addendum)
  Subjective:    Patient ID: Erin Wall, female    DOB: 17-Sep-1945, 67 y.o.   MRN: [id number]  PCP: Gerda Diss  HPI WORRIED ABOUT WEIGHT ON SCALE DISCREPANCY. SHE WEIGHS 192 LBS AT HOME AND 197 LBS AT THE OFC. CONCERNED BECAUSE PEOPLE  THINKS SHE HAS CANCER. ALIGN HELPED. STILL HAVING HAS AT NIGHT. DIDN'T LIKE THE HBT SIBO. NEVER GOT THE RESULTS. THE TEST WAS NORMAL.  DOESN'T SLEEP WELL. WAS ON XANAX LOW DOSE. NONE SINCE LEFT [facility]. C/O BURNING LOW BACK PAIN AFTER HOUSEWORK.  Past Medical History  Diagnosis Date  . Anxiety   . Hyperlipemia   . Overweight (BMI 25.0-29.9) AUG 2011 205 lbs   . GERD (gastroesophageal reflux disease)   . Borderline diabetes     Past Surgical History  Procedure Date  . Colonoscopy 2004  . Abdominal hysterectomy   . Colonoscopy 09/20/2010    WUJ:WJXBJYNW polyps,internal hemorrhoids/polypoid lesion in the cecum  . Esophagogastroduodenoscopy 09/20/2010    GNF:[id number]/QION gastritis  . Bacterial overgrowth test 01/01/2012    Procedure: BACTERIAL OVERGROWTH TEST;  Surgeon: West Bali, MD;  Location: [facility];  Service: Endoscopy;  Laterality: N/A;  7:30    Allergies  Allergen Reactions  . Pravachol Other (See Comments)    Muscle aches    Current Outpatient Prescriptions  Medication Sig Dispense Refill  . aspirin EC 81 MG tablet Take 81 mg by mouth daily.      Marland Kitchen CINNAMON PO Take 1 tablet by mouth daily.       . fish oil-omega-3 fatty acids 1000 MG capsule Take 1 g by mouth daily.        . Multiple Vitamins-Minerals (MULTIVITAMIN WITH MINERALS) tablet Take 1 tablet by mouth daily.        Marland Kitchen omeprazole (PRILOSEC) 20 MG capsule Take 20 mg by mouth daily.       . pravastatin (PRAVACHOL) 40 MG tablet Take 40 mg by mouth daily.          Review of Systems     Objective:   Physical Exam  Vitals reviewed. Constitutional: She is oriented to person, place, and time. She appears well-nourished. No distress.  HENT:  Head: Normocephalic  and atraumatic.  Mouth/Throat: Oropharynx is clear and moist. No oropharyngeal exudate.  Eyes: Pupils are equal, round, and reactive to light. No scleral icterus.  Neck: Normal range of motion. Neck supple.  Cardiovascular: Normal rate, regular rhythm and normal heart sounds.   Pulmonary/Chest: Effort normal and breath sounds normal. No respiratory distress.  Abdominal: Soft. Bowel sounds are normal. She exhibits no distension. There is no tenderness.  Musculoskeletal: Normal range of motion. She exhibits no edema.  Neurological: She is alert and oriented to person, place, and time.       NO FOCAL DEFICITS   Psychiatric:       ANXIOUS MOOD, NL AFFECT          Assessment & Plan:

## 2012-02-26 NOTE — Brief Op Note (Signed)
01/01/2012  1:37 PM  PATIENT:  Erin Wall  67 y.o. female  PRE-OPERATIVE DIAGNOSIS:  BLOATING  POST-OPERATIVE DIAGNOSIS:  NORMAL BREATH TEST FOR SIBO  PROCEDURE:  Procedure(s) (LRB) with comments: BACTERIAL OVERGROWTH TEST (N/A) - 7:30  SURGEON:  Surgeon(s) and Role:    * West Bali, MD - Primary  MEDICATIONS USED:  LACTULOSE  FINDINGS: BREATHALYZER- O MIN(1 PPM), FIRST PEAK (10 PPM, 105 MINS), TROUGH (5 PPM 120 MINS), SECOND PEAK (10 PPM, 135 MINS)  DIAGNOSIS: NORMAL BREATH TEST. SX MOST LIKELY DUE TO NON-ULCER DYSPEPSIA, LESS LIKLEY PT IS A METHANE PRODUCER.  PLAN OF CARE:  1. CONTINUE ALIGN DAILY FOREVER. 2. AVOID ITEMS THAT CAUSE BLOATING/GAS 3. Follow up in [facility] 2014

## 2012-05-07 ENCOUNTER — Encounter: Payer: Self-pay | Admitting: *Deleted

## 2012-05-09 ENCOUNTER — Encounter: Payer: Self-pay | Admitting: Family Medicine

## 2012-05-09 ENCOUNTER — Ambulatory Visit (INDEPENDENT_AMBULATORY_CARE_PROVIDER_SITE_OTHER): Payer: 59 | Admitting: Family Medicine

## 2012-05-09 VITALS — BP 156/94 | HR 80 | Ht 71.0 in | Wt 200.6 lb

## 2012-05-09 DIAGNOSIS — E785 Hyperlipidemia, unspecified: Secondary | ICD-10-CM

## 2012-05-09 DIAGNOSIS — D72820 Lymphocytosis (symptomatic): Secondary | ICD-10-CM | POA: Insufficient documentation

## 2012-05-09 DIAGNOSIS — M1990 Unspecified osteoarthritis, unspecified site: Secondary | ICD-10-CM

## 2012-05-09 DIAGNOSIS — R030 Elevated blood-pressure reading, without diagnosis of hypertension: Secondary | ICD-10-CM

## 2012-05-09 DIAGNOSIS — I10 Essential (primary) hypertension: Secondary | ICD-10-CM | POA: Insufficient documentation

## 2012-05-09 DIAGNOSIS — M129 Arthropathy, unspecified: Secondary | ICD-10-CM

## 2012-05-09 HISTORY — DX: Unspecified osteoarthritis, unspecified site: M19.90

## 2012-05-09 HISTORY — DX: Lymphocytosis (symptomatic): D72.820

## 2012-05-09 HISTORY — DX: Hyperlipidemia, unspecified: E78.5

## 2012-05-09 NOTE — Progress Notes (Signed)
  Subjective:    Patient ID: Erin Wall, female    DOB: [birth year], 67 y.o.   MRN: [id number]  Hyperlipidemia This is a chronic problem. The current episode started more than 1 year ago. The problem is controlled. Recent lipid tests were reviewed and are high. Exacerbating diseases include obesity. She has no history of chronic renal disease. There are no known factors aggravating her hyperlipidemia. Associated symptoms include leg pain. Current antihyperlipidemic treatment includes exercise and statins. The current treatment provides mild improvement of lipids. Compliance problems include adherence to diet.  Risk factors for coronary artery disease include obesity and dyslipidemia.  Leg Pain   aching in right leg. Sharp at times. Worse with working. Ongoing concern about weight loss. Had oictures before and after. Hx of reflux stable.  fam hx of hypertension and arthritis.  Review of Systems  All other systems reviewed and are negative.       Objective:   Physical Exam  Alert vital signs reviewed HEENT normal. Lungs clear. Heart regular in rhythm. Right hip good range of motion. Right knee crepitations evident. Thyroid not palpable. Blood pressure on repeat 142/88      Assessment & Plan:  Impression #1 elevated blood pressure discuss. #2 weight loss. Weight has remained within 5 pounds for 2 and half years. Reassurance attempted. #3 hyperlipidemia status uncertain. #4 history of lymphocytosis on CBC discussed. Plan appropriate blood work. If lipids have gone back to considerably he may need a different medicine. Aleve 2 tablets twice a day with food when necessary for pain. WSL

## 2012-05-09 NOTE — Patient Instructions (Signed)
Use aleave two tabs twice per day as neeeded for pain in leg.

## 2012-05-10 ENCOUNTER — Telehealth: Payer: Self-pay | Admitting: Family Medicine

## 2012-05-10 NOTE — Telephone Encounter (Signed)
Patient needs a refill of her Xanax .25mg  to [facility].

## 2012-05-10 NOTE — Telephone Encounter (Signed)
.  5 mg number 30 1 po prn 1 ref

## 2012-05-10 NOTE — Telephone Encounter (Signed)
Rx called into [facility] per doctors order.

## 2012-05-14 ENCOUNTER — Telehealth: Payer: Self-pay | Admitting: Family Medicine

## 2012-05-14 NOTE — Telephone Encounter (Signed)
She is one that did not get the blood work sheet (that no one told me about then) for tests we ordered when we saw her. Please get her sheet. Also, call in xanax .25, number 30 one qd prn anxiety (I thought we did this)

## 2012-05-14 NOTE — Telephone Encounter (Signed)
Patient needs BW paperwork and xanax .25mg  to [facility]? She said she hasn't received either since her visit last week.

## 2012-05-14 NOTE — Telephone Encounter (Signed)
Xanax called [facility] papers ready to pickup. Pt notified on her voicemail.

## 2012-07-02 LAB — CBC
Hemoglobin: 14.4 g/dL (ref 12.0–15.0)
MCHC: 33.6 g/dL (ref 30.0–36.0)
Platelets: 348 10*3/uL (ref 150–400)

## 2012-07-03 LAB — LIPID PANEL
HDL: 48 mg/dL (ref >39)
LDL Cholesterol: 156 mg/dL — ABNORMAL HIGH (ref 0–99)
Total CHOL/HDL Ratio: 4.7 Ratio
VLDL: 21 mg/dL (ref 0–40)

## 2012-07-03 LAB — BASIC METABOLIC PANEL
CO2: 27 mEq/L (ref 19–32)
Chloride: 108 mEq/L (ref 96–112)
Glucose, Bld: 99 mg/dL (ref 70–99)
Potassium: 5.7 mEq/L — ABNORMAL HIGH (ref 3.5–5.3)
Sodium: 146 mEq/L — ABNORMAL HIGH (ref 135–145)

## 2012-07-03 LAB — HEPATIC FUNCTION PANEL
AST: 26 U/L (ref 0–37)
Albumin: 4.2 g/dL (ref 3.5–5.2)
Bilirubin, Direct: 0.1 mg/dL (ref 0.0–0.3)
Total Bilirubin: 0.4 mg/dL (ref 0.3–1.2)

## 2012-07-15 ENCOUNTER — Ambulatory Visit (INDEPENDENT_AMBULATORY_CARE_PROVIDER_SITE_OTHER): Payer: 59 | Admitting: Family Medicine

## 2012-07-15 ENCOUNTER — Encounter: Payer: Self-pay | Admitting: Family Medicine

## 2012-07-15 VITALS — BP 144/86 | HR 70 | Wt 195.0 lb

## 2012-07-15 DIAGNOSIS — IMO0001 Reserved for inherently not codable concepts without codable children: Secondary | ICD-10-CM

## 2012-07-15 DIAGNOSIS — R030 Elevated blood-pressure reading, without diagnosis of hypertension: Secondary | ICD-10-CM

## 2012-07-15 DIAGNOSIS — E785 Hyperlipidemia, unspecified: Secondary | ICD-10-CM

## 2012-07-15 MED ORDER — ATORVASTATIN CALCIUM 20 MG PO TABS: 20.0000 mg | ORAL_TABLET | Freq: Every day | ORAL | Status: DC

## 2012-07-15 NOTE — Patient Instructions (Signed)
Please call us in three months an we will do blood work

## 2012-07-15 NOTE — Progress Notes (Signed)
  Subjective:    Patient ID: Erin Wall, female    DOB: [birth year], 67 y.o.   MRN: [id number]  Hyperlipidemia The current episode started more than 1 year ago. The problem is resistant. Recent lipid tests were reviewed and are normal. She has no history of chronic renal disease. There are no known factors aggravating her hyperlipidemia. Pertinent negatives include no chest pain. She is currently on no antihyperlipidemic treatment. The current treatment provides mild improvement of lipids.     Results for orders placed in visit on 05/09/12  CBC      Result Value Range   WBC 3.9 (*) 4.0 - 10.5 K/uL   RBC 5.07  3.87 - 5.11 MIL/uL   Hemoglobin 14.4  12.0 - 15.0 g/dL   HCT 40.9  81.1 - 91.4 %   MCV 84.6  78.0 - 100.0 fL   MCH 28.4  26.0 - 34.0 pg   MCHC 33.6  30.0 - 36.0 g/dL   RDW 78.2  95.6 - 21.3 %   Platelets 348  150 - 400 K/uL  HEPATIC FUNCTION PANEL      Result Value Range   Total Bilirubin 0.4  0.3 - 1.2 mg/dL   Bilirubin, Direct 0.1  0.0 - 0.3 mg/dL   Indirect Bilirubin 0.3  0.0 - 0.9 mg/dL   Alkaline Phosphatase 60  39 - 117 U/L   AST 26  0 - 37 U/L   ALT 20  0 - 35 U/L   Total Protein 6.8  6.0 - 8.3 g/dL   Albumin 4.2  3.5 - 5.2 g/dL  BASIC METABOLIC PANEL      Result Value Range   Sodium 146 (*) 135 - 145 mEq/L   Potassium 5.7 (*) 3.5 - 5.3 mEq/L   Chloride 108  96 - 112 mEq/L   CO2 27  19 - 32 mEq/L   Glucose, Bld 99  70 - 99 mg/dL   BUN 17  6 - 23 mg/dL   Creat 0.86  5.78 - 4.69 mg/dL   Calcium 62.9  8.4 - 52.8 mg/dL  LIPID PANEL      Result Value Range   Cholesterol 225 (*) 0 - 200 mg/dL   Triglycerides 413  <244 mg/dL   HDL 48  >01 mg/dL   Total CHOL/HDL Ratio 4.7     VLDL 21  0 - 40 mg/dL   LDL Cholesterol 027 (*) 0 - 99 mg/dL    Review of Systems  Cardiovascular: Negative for chest pain.   no shortness of breath no chest pain no headaches. Otherwise negative.     Objective:   Physical Exam  Alert no acute distress. Vitals stable. Lungs  clear. Heart regular rate and rhythm. Ankles without edema.      Assessment & Plan:  Impression hyperlipidemia. In the past status edea caused fatigue. Also had some muscle aches with statins specifically Pravachol. Willing to give another 1 inch wide. Numbers are too high. Plan Lipitor 20 daily. Take at night. Diet exercise discussed. Check every 6 months. Repeat blood work and just 3 months. WSL

## 2012-07-30 ENCOUNTER — Other Ambulatory Visit (HOSPITAL_COMMUNITY): Payer: Self-pay | Admitting: Urology

## 2012-07-30 DIAGNOSIS — N281 Cyst of kidney, acquired: Secondary | ICD-10-CM

## 2012-08-27 ENCOUNTER — Ambulatory Visit (HOSPITAL_COMMUNITY): Payer: Medicare Other

## 2012-11-15 ENCOUNTER — Encounter: Payer: Self-pay | Admitting: Family Medicine

## 2012-11-15 ENCOUNTER — Ambulatory Visit (INDEPENDENT_AMBULATORY_CARE_PROVIDER_SITE_OTHER): Payer: 59 | Admitting: Family Medicine

## 2012-11-15 VITALS — BP 140/88 | Ht 72.0 in | Wt 193.0 lb

## 2012-11-15 DIAGNOSIS — E785 Hyperlipidemia, unspecified: Secondary | ICD-10-CM

## 2012-11-15 DIAGNOSIS — Z79899 Other long term (current) drug therapy: Secondary | ICD-10-CM

## 2012-11-15 DIAGNOSIS — G609 Hereditary and idiopathic neuropathy, unspecified: Secondary | ICD-10-CM

## 2012-11-15 DIAGNOSIS — R7301 Impaired fasting glucose: Secondary | ICD-10-CM | POA: Insufficient documentation

## 2012-11-15 DIAGNOSIS — E782 Mixed hyperlipidemia: Secondary | ICD-10-CM

## 2012-11-15 DIAGNOSIS — R739 Hyperglycemia, unspecified: Secondary | ICD-10-CM

## 2012-11-15 DIAGNOSIS — R7309 Other abnormal glucose: Secondary | ICD-10-CM

## 2012-11-15 DIAGNOSIS — R5381 Other malaise: Secondary | ICD-10-CM

## 2012-11-15 DIAGNOSIS — Z0000 Encounter for general adult medical examination without abnormal findings: Secondary | ICD-10-CM

## 2012-11-15 HISTORY — DX: Impaired fasting glucose: R73.01

## 2012-11-15 LAB — GLUCOSE, POCT (MANUAL RESULT ENTRY): POC Glucose: 112 mg/dl — AB (ref 70–99)

## 2012-11-15 LAB — POCT GLYCOSYLATED HEMOGLOBIN (HGB A1C): Hemoglobin A1C: 5.5

## 2012-11-15 NOTE — Progress Notes (Signed)
  Subjective:    Patient ID: Erin Wall, female    DOB: Mar 31, 1945, 67 y.o.   MRN: [id number]  HPIHere for a med check.   Tingling in toes going on for several days. One and a half months ago.  Patient cholesterol medication. Wondering if this might have something to do his sugar problems. Review of old chart reveals elevated sugar in the past nearly 8 years ago. At that time that A1c is 6.0. Exercising some.  Numbness in toes,  Requesting blood sugar check. Blood sugar today 112. Family history of diabetes. Both parents.Pt had a fiber one bar, raisins, and unsweet milk.   Fruit fanatic eats a lot, diet soda, lemonad with diet,      Review of Systems No headache no chest pain no abdominal pain ROS otherwise negative    Objective:   Physical Exam  Alert HEENT normal. Lungs clear. Heart regular rate and rhythm. Ankles without edema. Distal sensation diminished in the plantar surface. Pulses good no edema      Assessment & Plan:  Impression 1 peripheral neuropathy may well be related to elevated glucose. A1c fortunately well at 5.5. #2 hyperlipidemia. #3 perceived side effects. Patient does not want statins if they may even slightly increased risk of her having sugar difficulties discussed at great length. #4 weight patient continues to be concerned about her relatively low weight compared to before. Review weights shows numbers are generally stable. #5 impaired fasting glucose Plan 25 minutes spent most in discussion. Appropriate blood work to screen for peripheral neuropathy. Diet exercise discussed. Maintain same meds.

## 2012-12-02 ENCOUNTER — Encounter: Payer: Self-pay | Admitting: Adult Health

## 2012-12-02 ENCOUNTER — Ambulatory Visit (INDEPENDENT_AMBULATORY_CARE_PROVIDER_SITE_OTHER): Payer: Medicare Other | Admitting: Adult Health

## 2012-12-02 VITALS — BP 142/90 | HR 76 | Ht 72.0 in | Wt 195.0 lb

## 2012-12-02 DIAGNOSIS — Z01419 Encounter for gynecological examination (general) (routine) without abnormal findings: Secondary | ICD-10-CM

## 2012-12-02 DIAGNOSIS — K5900 Constipation, unspecified: Secondary | ICD-10-CM | POA: Insufficient documentation

## 2012-12-02 DIAGNOSIS — I10 Essential (primary) hypertension: Secondary | ICD-10-CM

## 2012-12-02 DIAGNOSIS — Z1212 Encounter for screening for malignant neoplasm of rectum: Secondary | ICD-10-CM

## 2012-12-02 LAB — HEMOCCULT GUIAC POC 1CARD (OFFICE)

## 2012-12-02 NOTE — Progress Notes (Signed)
Patient ID: Erin Wall, female   DOB: 11-17-1945, 67 y.o.   MRN: [id number] History of Present Illness: Erin Wall is a 67 year old black female in for gyn physical.She does have some constipation and take align.She complains of some weight loss and seems to be in hips and thighs, she has had labs with PCP.   Current Medications, Allergies, Past Medical History, Past Surgical History, Family History and Social History were reviewed in [employer] record.   Past Medical History  Diagnosis Date  . Anxiety   . Hyperlipemia   . Overweight (BMI 25.0-29.9) AUG 2011 205 lbs   . GERD (gastroesophageal reflux disease)   . Borderline diabetes   . Hypertension   . IFG (impaired fasting glucose)   . Lung nodules   . Constipation 12/02/2012  Current outpatient prescriptions:ALPRAZolam (XANAX) 0.25 MG tablet, Take 0.25 mg by mouth 4 (four) times daily as needed., Disp: , Rfl: ;  aspirin EC 81 MG tablet, Take 81 mg by mouth daily., Disp: , Rfl: ;  CINNAMON PO, Take 1 tablet by mouth daily. , Disp: , Rfl: ;  CRANBERRY EXTRACT PO, Take 1 capsule by mouth daily., Disp: , Rfl: ;  fish oil-omega-3 fatty acids 1000 MG capsule, Take 1 g by mouth daily.  , Disp: , Rfl:  Multiple Vitamins-Minerals (MULTIVITAMIN WITH MINERALS) tablet, Take 1 tablet by mouth daily.  , Disp: , Rfl: ;  Probiotic Product (ALIGN PO), Take by mouth., Disp: , Rfl:   Review of Systems: Patient denies any current headaches, blurred vision, shortness of breath, chest pain, abdominal pain, problems with urination, or intercourse. No joint pain or mood changes.See HPI for positives.    Physical Exam:BP 142/90  Pulse 76  Ht 6' (1.829 m)  Wt 195 lb (88.451 kg)  BMI 26.44 kg/m2 General:  Well developed, well nourished, no acute distress Skin:  Warm and dry Neck:  Midline trachea, normal thyroid, no carotid bruits heard Lungs; Clear to auscultation bilaterally Breast:  No dominant palpable mass, retraction, or nipple  discharge, had normal mammogram 9/14 Cardiovascular: Regular rate and rhythm Abdomen:  Soft, non tender, no hepatosplenomegaly Pelvic:  External genitalia is normal in appearance.  The vagina is normal in appearance.the cervix and uterus are absent. No  adnexal masses or tenderness noted. Rectal: Good sphincter tone, no polyps, or hemorrhoids felt.  Hemoccult negative. Extremities:  No swelling or varicosities noted Psych:  No mood changes, alert and cooperative, seems happy   Impression: Yearly gyn exam no pap Constipation Hypertension   Plan: Physical in 1 year Mammogram yearly colonoscopy per GI  Labs per PCP Get flu shot Try prunes or prune juice and increase water decrease salt Review handout on constipation

## 2012-12-02 NOTE — Patient Instructions (Signed)
Physical in 1 year  Mammogram yearly Labs as per PCP Colonoscopy per GI Get flu shot Try prunes Constipation, Adult Constipation is when a person has fewer than 3 bowel movements a week; has difficulty having a bowel movement; or has stools that are dry, hard, or larger than normal. As people grow older, constipation is more common. If you try to fix constipation with medicines that make you have a bowel movement (laxatives), the problem may get worse. Long-term laxative use may cause the muscles of the colon to become weak. A low-fiber diet, not taking in enough fluids, and taking certain medicines may make constipation worse. CAUSES   Certain medicines, such as antidepressants, pain medicine, iron supplements, antacids, and water pills.   Certain diseases, such as diabetes, irritable bowel syndrome (IBS), thyroid disease, or depression.   Not drinking enough water.   Not eating enough fiber-rich foods.   Stress or travel.  Lack of physical activity or exercise.  Not going to the restroom when there is the urge to have a bowel movement.  Ignoring the urge to have a bowel movement.  Using laxatives too much. SYMPTOMS   Having fewer than 3 bowel movements a week.   Straining to have a bowel movement.   Having hard, dry, or larger than normal stools.   Feeling full or bloated.   Pain in the lower abdomen.  Not feeling relief after having a bowel movement. DIAGNOSIS  Your caregiver will take a medical history and perform a physical exam. Further testing may be done for severe constipation. Some tests may include:   A barium enema X-ray to examine your rectum, colon, and sometimes, your small intestine.  A sigmoidoscopy to examine your lower colon.  A colonoscopy to examine your entire colon. TREATMENT  Treatment will depend on the severity of your constipation and what is causing it. Some dietary treatments include drinking more fluids and eating more fiber-rich  foods. Lifestyle treatments may include regular exercise. If these diet and lifestyle recommendations do not help, your caregiver may recommend taking over-the-counter laxative medicines to help you have bowel movements. Prescription medicines may be prescribed if over-the-counter medicines do not work.  HOME CARE INSTRUCTIONS   Increase dietary fiber in your diet, such as fruits, vegetables, whole grains, and beans. Limit high-fat and processed sugars in your diet, such as [place] fries, hamburgers, cookies, candies, and soda.   A fiber supplement may be added to your diet if you cannot get enough fiber from foods.   Drink enough fluids to keep your urine clear or pale yellow.   Exercise regularly or as directed by your caregiver.   Go to the restroom when you have the urge to go. Do not hold it.  Only take medicines as directed by your caregiver. Do not take other medicines for constipation without talking to your caregiver first. SEEK IMMEDIATE MEDICAL CARE IF:   You have bright red blood in your stool.   Your constipation lasts for more than 4 days or gets worse.   You have abdominal or rectal pain.   You have thin, pencil-like stools.  You have unexplained weight loss. MAKE SURE YOU:   Understand these instructions.  Will watch your condition.  Will get help right away if you are not doing well or get worse. Document Released: 10/29/2003 Document Revised: 04/24/2011 Document Reviewed: 01/03/2011 [facility] Patient Information 2014 [place], [place].

## 2012-12-24 LAB — HEPATIC FUNCTION PANEL
ALT: 23 U/L (ref 0–35)
AST: 27 U/L (ref 0–37)
Albumin: 3.9 g/dL (ref 3.5–5.2)
Alkaline Phosphatase: 51 U/L (ref 39–117)

## 2012-12-24 LAB — LIPID PANEL
Cholesterol: 217 mg/dL — ABNORMAL HIGH (ref 0–200)
Total CHOL/HDL Ratio: 4.3 Ratio

## 2012-12-24 LAB — TSH: TSH: 0.475 u[IU]/mL (ref 0.350–4.500)

## 2013-01-10 ENCOUNTER — Encounter: Payer: Self-pay | Admitting: Family Medicine

## 2013-01-14 ENCOUNTER — Telehealth: Payer: Self-pay | Admitting: Family Medicine

## 2013-01-14 NOTE — Telephone Encounter (Signed)
Notified patient all bw good except lipids still elevated--slightly better than before. Keep working hard on diet and exercise. Patient verbalized understanding.

## 2013-01-14 NOTE — Telephone Encounter (Signed)
Patient called wanting results of labs done in November.

## 2013-01-14 NOTE — Telephone Encounter (Signed)
In my chart denoted as "pending" which means we usually sent letter. Anyway, all b w good except lipids still elevated--slightly better than before . keeep working hard on diet and exercise

## 2013-02-20 ENCOUNTER — Ambulatory Visit (INDEPENDENT_AMBULATORY_CARE_PROVIDER_SITE_OTHER): Payer: Medicare Other | Admitting: Gastroenterology

## 2013-02-20 ENCOUNTER — Encounter (INDEPENDENT_AMBULATORY_CARE_PROVIDER_SITE_OTHER): Payer: Self-pay

## 2013-02-20 ENCOUNTER — Encounter: Payer: Self-pay | Admitting: Gastroenterology

## 2013-02-20 VITALS — BP 149/86 | HR 99 | Temp 97.4°F | Wt 190.0 lb

## 2013-02-20 DIAGNOSIS — R142 Eructation: Principal | ICD-10-CM

## 2013-02-20 DIAGNOSIS — R634 Abnormal weight loss: Secondary | ICD-10-CM

## 2013-02-20 DIAGNOSIS — K5900 Constipation, unspecified: Secondary | ICD-10-CM

## 2013-02-20 DIAGNOSIS — R143 Flatulence: Principal | ICD-10-CM

## 2013-02-20 DIAGNOSIS — R141 Gas pain: Secondary | ICD-10-CM

## 2013-02-20 MED ORDER — LINACLOTIDE 145 MCG PO CAPS: ORAL_CAPSULE | ORAL | Status: DC

## 2013-02-20 NOTE — Assessment & Plan Note (Signed)
MOST LIKELY IDIOPATHIC.  ADD LINZESS CONTINUE PROBIOTIC DRINK WATER EAT FIBER. AVOID ITEMS THAT CAUSE BLOATING & GAS. FOLLOW UP IN 4 MOS.

## 2013-02-20 NOTE — Assessment & Plan Note (Signed)
UNEXPLAINED AND ASSOCIATED WITH LOWER ABD PAIN AND CHANGE IN BOWEL HABITS. LAST TCS 2012.  CT ABD/PELVIS FOR WEIGHT LOSS/ABD PAIN/CHANGE IN BOWEL HABITS CALL IN ONE MO WITH UP DATE. IF SX NOT IMPROVED CONSIDER REPEAT TCS.

## 2013-02-20 NOTE — Progress Notes (Signed)
cc'd to pcp 

## 2013-02-20 NOTE — Patient Instructions (Signed)
COMPLETE CT SCAN.  ADD LINZESS.   CONTINUE PROBIOTIC DAILY.  DRINK WATER TO KEEP YOUR URINE LIGHT YELLOW.  FOLLOW A HIGH FIBER DIET. SEE INFO BELOW. AVOID ITEMS THAT CAUSE BLOATING & GAS.  CALL IN ONE MONTH IF CONSTIPATION IS STILL A PROBLEM.  FOLLOW UP IN 4 MOS.   High-Fiber Diet A high-fiber diet changes your normal diet to include more whole grains, legumes, fruits, and vegetables. Changes in the diet involve replacing refined carbohydrates with unrefined foods. The calorie level of the diet is essentially unchanged. The Dietary Reference Intake (recommended amount) for adult males is 38 grams per day. For adult females, it is 25 grams per day. Pregnant and lactating women should consume 28 grams of fiber per day. Fiber is the intact part of a plant that is not broken down during digestion. Functional fiber is fiber that has been isolated from the plant to provide a beneficial effect in the body. PURPOSE  Increase stool bulk.   Ease and regulate bowel movements.   Lower cholesterol.  INDICATIONS THAT YOU NEED MORE FIBER  Constipation and hemorrhoids.   Uncomplicated diverticulosis (intestine condition) and irritable bowel syndrome.   Weight management.   As a protective measure against hardening of the arteries (atherosclerosis), diabetes, and cancer.   GUIDELINES FOR INCREASING FIBER IN THE DIET  Start adding fiber to the diet slowly. A gradual increase of about 5 more grams (2 slices of whole-wheat bread, 2 servings of most fruits or vegetables, or 1 bowl of high-fiber cereal) per day is best. Too rapid an increase in fiber may result in constipation, flatulence, and bloating.   Drink enough water and fluids to keep your urine clear or pale yellow. Water, juice, or caffeine-free drinks are recommended. Not drinking enough fluid may cause constipation.   Eat a variety of high-fiber foods rather than one type of fiber.   Try to increase your intake of fiber through using  high-fiber foods rather than fiber pills or supplements that contain small amounts of fiber.   The goal is to change the types of food eaten. Do not supplement your present diet with high-fiber foods, but replace foods in your present diet.  INCLUDE A VARIETY OF FIBER SOURCES  Replace refined and processed grains with whole grains, canned fruits with fresh fruits, and incorporate other fiber sources. White rice, white breads, and most bakery goods contain little or no fiber.   Brown whole-grain rice, buckwheat oats, and many fruits and vegetables are all good sources of fiber. These include: broccoli, Brussels sprouts, cabbage, cauliflower, beets, sweet potatoes, white potatoes (skin on), carrots, tomatoes, eggplant, squash, berries, fresh fruits, and dried fruits.   Cereals appear to be the richest source of fiber. Cereal fiber is found in whole grains and bran. Bran is the fiber-rich outer coat of cereal grain, which is largely removed in refining. In whole-grain cereals, the bran remains. In breakfast cereals, the largest amount of fiber is found in those with "bran" in their names. The fiber content is sometimes indicated on the label.   You may need to include additional fruits and vegetables each day.   In baking, for 1 cup white flour, you may use the following substitutions:   1 cup whole-wheat flour minus 2 tablespoons.   1/2 cup white flour plus 1/2 cup whole-wheat flour.

## 2013-02-20 NOTE — Assessment & Plan Note (Signed)
MOST LIKLEY DUE TO DIETARY CHOICES.  CONTINUE PROBIOTIC. DECREASE ICE CREAM INTAKE OPV IN 4 MOS

## 2013-02-20 NOTE — Progress Notes (Signed)
   Subjective:  Erin Battiest, MD   Patient ID: EMRI SAMPLE, female    DOB: Nov 29, 1945, 68 y.o.   MRN: [id number]  HPI Having trouble with constipation. Having to strain with BMs. TAKES MIRALAX PRN. [place] OTC. Afterwards feels tender in lower abdomen. ICE CREAM FANATIC. WATER: 1500 ML. TRYING TIO EAT FIBER. PROBLEM WITH GAS. PT DENIES FEVER, CHILLS, BRBPR, nausea, vomiting, melena, diarrhea, problems swallowing, OR heartburn or indigestion. PT BROUGHT PICTURES OF HER APPEARANCE BEFORE AND AFTER HER WEIGHT LOSS.  Past Medical History  Diagnosis Date  . Anxiety   . Hyperlipemia   . Overweight (BMI 25.0-29.9) AUG 2011 205 lbs   . GERD (gastroesophageal reflux disease)   . Borderline diabetes   . Hypertension   . IFG (impaired fasting glucose)   . Lung nodules   . Constipation 12/02/2012   Past Surgical History  Procedure Laterality Date  . Colonoscopy  2004  . Abdominal hysterectomy    . Colonoscopy  09/20/2010    IZT:[id number] polyps,internal hemorrhoids/polypoid lesion in the cecum  . Esophagogastroduodenoscopy  09/20/2010    PJA:[id number]/HALP gastritis  . Bacterial overgrowth test  01/01/2012    Procedure: BACTERIAL OVERGROWTH TEST;  Surgeon: Danie Binder, MD;  [facility];  Service: Endoscopy;  Laterality: N/A;  7:30  . Breast surgery      tumor removed   Allergies  Allergen Reactions  . Pravachol Other (See Comments)    Muscle aches   Current Outpatient Prescriptions  Medication Sig Dispense Refill  . aspirin EC 81 MG tablet Take 81 mg by mouth daily.      Marland Kitchen CINNAMON PO Take 1 tablet by mouth daily.       Marland Kitchen CRANBERRY EXTRACT PO Take 1 capsule by mouth daily.      . fish oil-omega-3 fatty acids 1000 MG capsule Take 1 g by mouth daily.        . Multiple Vitamins-Minerals (MULTIVITAMIN WITH MINERALS) tablet Take 1 tablet by mouth daily.        . Probiotic Product (ALIGN PO) Take by mouth.      . ALPRAZolam (XANAX) 0.25 MG tablet Take 0.25 mg by mouth  4 (four) times daily as needed.         Review of Systems     Objective:   Physical Exam  Vitals reviewed. Constitutional: She is oriented to person, place, and time. She appears well-nourished. No distress.  HENT:  Head: Normocephalic and atraumatic.  Mouth/Throat: Oropharynx is clear and moist. No oropharyngeal exudate.  Eyes: Pupils are equal, round, and reactive to light. No scleral icterus.  Neck: Normal range of motion. Neck supple.  Cardiovascular: Normal rate, regular rhythm and normal heart sounds.   Pulmonary/Chest: Effort normal and breath sounds normal. No respiratory distress.  Abdominal: Soft. Bowel sounds are normal. She exhibits no distension. There is no tenderness.  Musculoskeletal: She exhibits no edema.  Lymphadenopathy:    She has no cervical adenopathy.  Neurological: She is alert and oriented to person, place, and time.  NO FOCAL DEFICITS           Assessment & Plan:

## 2013-02-21 ENCOUNTER — Other Ambulatory Visit: Payer: Self-pay | Admitting: Gastroenterology

## 2013-02-21 DIAGNOSIS — R634 Abnormal weight loss: Secondary | ICD-10-CM

## 2013-02-21 DIAGNOSIS — R109 Unspecified abdominal pain: Secondary | ICD-10-CM

## 2013-02-24 ENCOUNTER — Telehealth: Payer: Self-pay

## 2013-02-24 LAB — CREATININE, SERUM: Creat: 1 mg/dL (ref 0.50–1.10)

## 2013-02-24 NOTE — Telephone Encounter (Signed)
Pt called and said the pharmacist would not recognize her [place] discount card. I checked with Ginger who said that the drug rep said the Medicaid pt's should only pay $3.00 for the prescription. I told her, but gave her 2 boxes of the 145 mcg Linzess to take one daily, 30 min prior to breakfast, until she has time to get this taken care of.

## 2013-02-24 NOTE — Telephone Encounter (Signed)
REVIEWED.  

## 2013-02-25 ENCOUNTER — Ambulatory Visit (HOSPITAL_COMMUNITY)
Admission: RE | Admit: 2013-02-25 | Discharge: 2013-02-25 | Disposition: A | Discharge status: Home / Self Care | Payer: Medicare Other | Source: Ambulatory Visit | Attending: Gastroenterology | Admitting: Gastroenterology

## 2013-02-25 DIAGNOSIS — N281 Cyst of kidney, acquired: Secondary | ICD-10-CM | POA: Insufficient documentation

## 2013-02-25 DIAGNOSIS — R634 Abnormal weight loss: Secondary | ICD-10-CM

## 2013-02-25 DIAGNOSIS — R109 Unspecified abdominal pain: Secondary | ICD-10-CM | POA: Insufficient documentation

## 2013-02-25 MED ORDER — IOHEXOL 300 MG/ML  SOLN
100.0000 mL | Freq: Once | INTRAMUSCULAR | Status: AC | PRN
  Administered 2013-02-25: 100 mL via INTRAVENOUS

## 2013-02-25 MED ORDER — SODIUM CHLORIDE 0.9 % IJ SOLN
INTRAMUSCULAR | Status: AC
  Filled 2013-02-25: qty 500

## 2013-02-25 NOTE — Progress Notes (Signed)
PLEASE CALL PT. HER CT SCAN IS ESSENTIALLY NORMAL. SHE HAS A BENIGN CYST ON HER RIGHT KIDNEY.

## 2013-02-26 NOTE — Progress Notes (Signed)
Called and informed pt.  

## 2013-03-29 ENCOUNTER — Emergency Department (HOSPITAL_COMMUNITY): Payer: No Typology Code available for payment source

## 2013-03-29 ENCOUNTER — Emergency Department (HOSPITAL_COMMUNITY)
Admission: EM | Admit: 2013-03-29 | Discharge: 2013-03-29 | Disposition: A | Discharge status: Home / Self Care | Payer: No Typology Code available for payment source | Attending: Emergency Medicine | Admitting: Emergency Medicine

## 2013-03-29 ENCOUNTER — Encounter (HOSPITAL_COMMUNITY): Payer: Self-pay | Admitting: Emergency Medicine

## 2013-03-29 DIAGNOSIS — Y92410 Unspecified street and highway as the place of occurrence of the external cause: Secondary | ICD-10-CM | POA: Insufficient documentation

## 2013-03-29 DIAGNOSIS — S335XXA Sprain of ligaments of lumbar spine, initial encounter: Secondary | ICD-10-CM | POA: Insufficient documentation

## 2013-03-29 DIAGNOSIS — Z791 Long term (current) use of non-steroidal anti-inflammatories (NSAID): Secondary | ICD-10-CM | POA: Insufficient documentation

## 2013-03-29 DIAGNOSIS — S29019A Strain of muscle and tendon of unspecified wall of thorax, initial encounter: Secondary | ICD-10-CM

## 2013-03-29 DIAGNOSIS — Y9389 Activity, other specified: Secondary | ICD-10-CM | POA: Insufficient documentation

## 2013-03-29 DIAGNOSIS — Z7982 Long term (current) use of aspirin: Secondary | ICD-10-CM | POA: Insufficient documentation

## 2013-03-29 DIAGNOSIS — Z79899 Other long term (current) drug therapy: Secondary | ICD-10-CM | POA: Insufficient documentation

## 2013-03-29 DIAGNOSIS — S239XXA Sprain of unspecified parts of thorax, initial encounter: Secondary | ICD-10-CM | POA: Insufficient documentation

## 2013-03-29 DIAGNOSIS — I10 Essential (primary) hypertension: Secondary | ICD-10-CM | POA: Insufficient documentation

## 2013-03-29 DIAGNOSIS — S39012A Strain of muscle, fascia and tendon of lower back, initial encounter: Secondary | ICD-10-CM

## 2013-03-29 DIAGNOSIS — F411 Generalized anxiety disorder: Secondary | ICD-10-CM | POA: Insufficient documentation

## 2013-03-29 DIAGNOSIS — E663 Overweight: Secondary | ICD-10-CM | POA: Insufficient documentation

## 2013-03-29 DIAGNOSIS — K5900 Constipation, unspecified: Secondary | ICD-10-CM | POA: Insufficient documentation

## 2013-03-29 MED ORDER — NAPROXEN 250 MG PO TABS
500.0000 mg | ORAL_TABLET | Freq: Once | ORAL | Status: AC
  Administered 2013-03-29: 500 mg via ORAL
  Filled 2013-03-29: qty 2

## 2013-03-29 MED ORDER — NAPROXEN 500 MG PO TABS: 500.0000 mg | ORAL_TABLET | Freq: Two times a day (BID) | ORAL | Status: DC

## 2013-03-29 MED ORDER — METHOCARBAMOL 500 MG PO TABS: 1000.0000 mg | ORAL_TABLET | Freq: Four times a day (QID) | ORAL | Status: AC

## 2013-03-29 NOTE — ED Provider Notes (Signed)
Medical screening examination/treatment/procedure(s) were performed by non-physician practitioner and as supervising physician I was immediately available for consultation/collaboration.  EKG Interpretation   None        Nat Christen, MD 03/29/13 1441
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
## 2013-05-08 ENCOUNTER — Encounter (HOSPITAL_COMMUNITY): Payer: Self-pay | Admitting: Dietician

## 2013-05-08 NOTE — Progress Notes (Signed)
[facility] Diabetes Class Completion  Date:May 08, 2013  Time: 1730  Pt attended [facility]'s Diabetes [facility] Education Class on May 08, 2013.   Patient was educated on the following topics:   -Survival skills (signs and symptoms of hyperglycemia and hypoglycemia, treatment for hypoglycemia, ideal levels for fasting and postprandial blood sugars, goal Hgb A1c level, foot care basics)  -Recommendations for physical activity   -Carbohydrate metabolism in relation to diabetes   -Meal planning (sources of carbohydrate, carbohydrate counting, meal planning strategies, food label reading, and portion control).  Handouts provided:  -"Diabetes and You: Taking Charge of Your Health"  -"Carbohydrate Counting and Meal Planning"  -"Your Guide to Better Office Visits"   Frandy Basnett A. Raynell Upton, RD, LDN  

## 2013-05-27 ENCOUNTER — Encounter: Payer: Self-pay | Admitting: Gastroenterology

## 2013-06-30 ENCOUNTER — Telehealth: Payer: Self-pay | Admitting: Family Medicine

## 2013-06-30 DIAGNOSIS — R7301 Impaired fasting glucose: Secondary | ICD-10-CM

## 2013-06-30 DIAGNOSIS — E782 Mixed hyperlipidemia: Secondary | ICD-10-CM

## 2013-06-30 NOTE — Telephone Encounter (Signed)
Left message on voicemail notifying patient that blood work has been ordered.  

## 2013-06-30 NOTE — Telephone Encounter (Signed)
Lip glu 

## 2013-06-30 NOTE — Telephone Encounter (Signed)
Pt needs bw orders for appt on 5/26

## 2013-07-01 LAB — LIPID PANEL
Cholesterol: 230 mg/dL — ABNORMAL HIGH (ref 0–200)
HDL: 47 mg/dL (ref >39)
LDL CALC: 160 mg/dL — AB (ref 0–99)
Total CHOL/HDL Ratio: 4.9 Ratio
Triglycerides: 114 mg/dL (ref <150)
VLDL: 23 mg/dL (ref 0–40)

## 2013-07-01 LAB — GLUCOSE, RANDOM: Glucose, Bld: 96 mg/dL (ref 70–99)

## 2013-07-02 ENCOUNTER — Ambulatory Visit: Payer: Medicare Other | Admitting: Family Medicine

## 2013-07-08 ENCOUNTER — Ambulatory Visit (INDEPENDENT_AMBULATORY_CARE_PROVIDER_SITE_OTHER): Payer: Medicare Other | Admitting: Family Medicine

## 2013-07-08 ENCOUNTER — Encounter: Payer: Self-pay | Admitting: Family Medicine

## 2013-07-08 VITALS — BP 110/88 | Ht 72.0 in | Wt 199.5 lb

## 2013-07-08 DIAGNOSIS — R634 Abnormal weight loss: Secondary | ICD-10-CM

## 2013-07-08 DIAGNOSIS — G609 Hereditary and idiopathic neuropathy, unspecified: Secondary | ICD-10-CM

## 2013-07-08 DIAGNOSIS — R7301 Impaired fasting glucose: Secondary | ICD-10-CM

## 2013-07-08 MED ORDER — ALPRAZOLAM 0.5 MG PO TABS: 0.5000 mg | ORAL_TABLET | Freq: Every evening | ORAL | Status: DC | PRN

## 2013-07-08 NOTE — Progress Notes (Signed)
   Subjective:    Patient ID: Erin Wall, female    DOB: 01-26-1946, 68 y.o.   MRN: [id number]  Hyperlipidemia This is a chronic problem. The current episode started more than 1 year ago. The problem is controlled. There are no known factors aggravating her hyperlipidemia. Treatments tried: fish oil. The current treatment provides moderate improvement of lipids. There are no compliance problems.  There are no known risk factors for coronary artery disease.  Patient had bloodwork done and results are in the system.   Patient would like to discuss her glucose reading. History of elevated sugar. Very worried about this because ER doctor told her she was close to diabetes.  History of hypertension. Treated by diet and exercise alone. Recent blood pressures and good.  Results for orders placed in visit on 06/30/13  LIPID PANEL      Result Value Ref Range   Cholesterol 230 (*) 0 - 200 mg/dL   Triglycerides 114  <150 mg/dL   HDL 47  >39 mg/dL   Total CHOL/HDL Ratio 4.9     VLDL 23  0 - 40 mg/dL   LDL Cholesterol 160 (*) 0 - 99 mg/dL  GLUCOSE, RANDOM      Result Value Ref Range   Glucose, Bld 96  70 - 99 mg/dL   Exercises five d per wk/  Hx of insomnia, used sxana in past and it helped. Would like a prescription to help on an as-needed basis.  Eats fish oil and a lot of fish   Review of Systems No chest pain no back pain no abdominal pain no change in bowel habits no blood in stool ROS otherwise negative alert no apparent distress. HEENT normal. Lungs clear. Heart rare rhythm. Ankles without edema.    Objective:   Physical Exam  See above      Assessment & Plan:  Impression 1 insomnia discussed patient request medicine. #2 hypertension controlled by diet exercise 1. #3 hyperlipidemia LDL quite high patient resistant to medicines. #4 reflux clinically stable #5 impaired fasting glucose not 2 diabetes discussed plan meds refilled. Xanax written. Diet exercise discussed. Check  every 6 months. Followup women's health exams at Dr. Johnnye Sima. [facility]

## 2013-07-24 ENCOUNTER — Ambulatory Visit: Payer: Medicare Other | Admitting: Nurse Practitioner

## 2013-08-21 ENCOUNTER — Other Ambulatory Visit: Payer: Self-pay | Admitting: Family Medicine

## 2013-08-22 NOTE — Telephone Encounter (Signed)
May refill this +3 additional 

## 2013-08-22 NOTE — Telephone Encounter (Signed)
Last seen 07/08/13.

## 2013-09-15 ENCOUNTER — Other Ambulatory Visit: Payer: Self-pay | Admitting: Adult Health

## 2013-09-15 DIAGNOSIS — Z139 Encounter for screening, unspecified: Secondary | ICD-10-CM

## 2013-10-02 ENCOUNTER — Telehealth: Payer: Self-pay | Admitting: Family Medicine

## 2013-10-02 NOTE — Telephone Encounter (Signed)
Patient dropped off a letter that she received from [facility] that she would like Dr. Richardson Landry to look at because it really upset her. Please view and advise.

## 2013-10-03 NOTE — Telephone Encounter (Signed)
I believe she still wanted you to look at the letter that she called "nasty" that [facility] had sent her because it really made her upset.

## 2013-10-03 NOTE — Telephone Encounter (Signed)
ok 

## 2013-10-03 NOTE — Telephone Encounter (Signed)
Patient stated she spoke with the office and has worked it out and has a follow up office visit scheduled with them next month.

## 2013-10-03 NOTE — Telephone Encounter (Signed)
Advise the pt i appreciate her bringing this by, but it was sent to her by her urologist regarding her renal cysts. She will have to spk with them regarding their recommendatios as far as follow up. If after speaking with them she'd like to sched a visit with me for a discussion she can do that

## 2013-10-03 NOTE — Telephone Encounter (Signed)
Pt would like to know if we can change her to another urologist  Please, as soon as possible.

## 2013-10-03 NOTE — Telephone Encounter (Signed)
i guess this means cancelling the whole message????

## 2013-10-03 NOTE — Telephone Encounter (Signed)
Pt called back and said she heard from the urologist and would like to cancel the message about finding another urologist.

## 2013-10-03 NOTE — Telephone Encounter (Signed)
i guess this means cancelling the whole message???

## 2013-10-15 ENCOUNTER — Ambulatory Visit: Payer: Medicare Other | Admitting: Family Medicine

## 2013-10-17 ENCOUNTER — Ambulatory Visit (INDEPENDENT_AMBULATORY_CARE_PROVIDER_SITE_OTHER): Payer: Medicare Other | Admitting: Family Medicine

## 2013-10-17 ENCOUNTER — Encounter: Payer: Self-pay | Admitting: Family Medicine

## 2013-10-17 VITALS — BP 124/86 | Ht 72.0 in | Wt 201.5 lb

## 2013-10-17 DIAGNOSIS — M129 Arthropathy, unspecified: Secondary | ICD-10-CM

## 2013-10-17 DIAGNOSIS — R634 Abnormal weight loss: Secondary | ICD-10-CM

## 2013-10-17 DIAGNOSIS — R7301 Impaired fasting glucose: Secondary | ICD-10-CM

## 2013-10-17 DIAGNOSIS — R739 Hyperglycemia, unspecified: Secondary | ICD-10-CM

## 2013-10-17 DIAGNOSIS — M1990 Unspecified osteoarthritis, unspecified site: Secondary | ICD-10-CM

## 2013-10-17 DIAGNOSIS — R7309 Other abnormal glucose: Secondary | ICD-10-CM

## 2013-10-17 LAB — POCT GLUCOSE (DEVICE FOR HOME USE): POC GLUCOSE: 125 mg/dL — AB (ref 70–99)

## 2013-10-17 NOTE — Progress Notes (Signed)
   Subjective:    Patient ID: Erin Wall, female    DOB: 1945/03/25, 68 y.o.   MRN: [id number]  HPI Patient is here today for unexplained weight loss. This has been present for several years now. Patient states that she knows something is wrong. Patient states that she is concerned about her blood sugar being elevated also.   Pt knows tiredness and fatigue, feeling diminished energy.   Still concerned about losing weight.  Results for orders placed in visit on 10/17/13  POCT GLUCOSE (DEVICE FOR HOME USE)      Result Value Ref Range   Glucose Fasting, POC    70 - 99 mg/dL   POC Glucose 125 (*) 70 - 99 mg/dl   Exercises very regularly.   Patient states that when she takes the cinnamon, she has shooting pains in her arms.   Patient states that she has gas painswhen taking cinammon supp, experience sharp pains,  Do not take cinammon a lot also.    Patient states she has no other concerns at this time.    Review of Systems Chest pain no back pain no abdominal pain no change in bowel habits no blood in stool    Objective:   Physical Exam  Alert no apparent distress. Vitals stable. Lungs clear. Heart regular in rhythm. H&T normal. Ankles edema.      Assessment & Plan:  Impression weight loss. Patient is now 201 pounds. Yet despite this. She feels that she needs to gain weight. I reviewed with her her weights over the last several years. She actually has gained 10 pounds in the last 3 years. She tells her that everyone tells her that "she does not look healthy". She goes to the OB/GYN's regularly for her female care and wellness exams. At one point she did weigh more but her weight currently has been stable. #2 glucose intolerance discussed plan 25 minutes spent with this patient most in discussion. I am frankly at a loss. Normally I would be encouraging weight loss and she definitely wishes to continue to gain. Followup as scheduled. WSL

## 2013-11-26 ENCOUNTER — Ambulatory Visit (HOSPITAL_COMMUNITY)
Admission: RE | Admit: 2013-11-26 | Discharge: 2013-11-26 | Disposition: A | Discharge status: Home / Self Care | Payer: Medicare Other | Source: Ambulatory Visit | Attending: Adult Health | Admitting: Adult Health

## 2013-11-26 DIAGNOSIS — Z139 Encounter for screening, unspecified: Secondary | ICD-10-CM

## 2013-11-26 DIAGNOSIS — Z1231 Encounter for screening mammogram for malignant neoplasm of breast: Secondary | ICD-10-CM | POA: Diagnosis not present

## 2013-11-27 ENCOUNTER — Ambulatory Visit (HOSPITAL_COMMUNITY): Payer: Medicare Other

## 2013-12-04 ENCOUNTER — Encounter: Payer: Self-pay | Admitting: Adult Health

## 2013-12-04 ENCOUNTER — Ambulatory Visit (INDEPENDENT_AMBULATORY_CARE_PROVIDER_SITE_OTHER): Payer: Medicare Other | Admitting: Adult Health

## 2013-12-04 VITALS — BP 150/80 | HR 78 | Ht 69.25 in | Wt 198.0 lb

## 2013-12-04 DIAGNOSIS — R739 Hyperglycemia, unspecified: Secondary | ICD-10-CM

## 2013-12-04 DIAGNOSIS — Z01419 Encounter for gynecological examination (general) (routine) without abnormal findings: Secondary | ICD-10-CM

## 2013-12-04 DIAGNOSIS — Z1212 Encounter for screening for malignant neoplasm of rectum: Secondary | ICD-10-CM

## 2013-12-04 LAB — HEMOCCULT GUIAC POC 1CARD (OFFICE): FECAL OCCULT BLD: NEGATIVE

## 2013-12-04 LAB — HEMOGLOBIN A1C
Hgb A1c MFr Bld: 6.1 % — ABNORMAL HIGH (ref <5.7)
MEAN PLASMA GLUCOSE: 128 mg/dL — AB (ref <117)

## 2013-12-04 NOTE — Patient Instructions (Signed)
Physical in 1 year Mammogram yearly  Colonoscopy per Dr Oneida Alar Get flu shot

## 2013-12-04 NOTE — Progress Notes (Signed)
Patient ID: Erin Wall, female   DOB: [birth year], 68 y.o.   MRN: [id number] History of Present Illness: Erin Wall is a 68 year old black female in for gyn physical, she is sp hysterectomy and had her mammogram last week and it was normal.   Current Medications, Allergies, Past Medical History, Past Surgical History, Family History and Social History were reviewed in [place] record.     Review of Systems: Patient denies any headaches, blurred vision, shortness of breath, chest pain, abdominal pain, problems with bowel movements.She is not having sex, and is up at night to void several times.has splint on left wrist,no mood swings.    Physical Exam:BP 150/80  Pulse 78  Ht 5' 9.25" (1.759 m)  Wt 198 lb (89.812 kg)  BMI 29.03 kg/m2 General:  Well developed, well nourished, no acute distress Skin:  Warm and dry Neck:  Midline trachea, normal thyroid, no carotid bruits heard Lungs; Clear to auscultation bilaterally Breast: Deferred had normal mammogram last week Cardiovascular: Regular rate and rhythm Abdomen:  Soft, non tender, no hepatosplenomegaly Pelvic:  External genitalia is normal in appearance for age, no lesions.  The vagina has decreased color, moisture and rugae.The cervix and uterus are absent.  No  adnexal masses or tenderness noted. Rectal: Good sphincter tone, no polyps, or hemorrhoids felt.  Hemoccult negative. Extremities:  No swelling or varicosities noted Psych:  No mood changes,alert and cooperative,seems happy. Ask about T- dap and flu shot, had shingles and pneumonia vaccine years ago with Dr Caron Presume.Sees Mickie Hillier now.  Impression: Well woman gyn exam no pap History of elevated BS  Plan: Check A1c Get flu shot Rx given to get T-dap at health dept. Physical in 1 year Mammogram yearly  Colonoscopy per Dr Su Grand

## 2013-12-05 ENCOUNTER — Telehealth: Payer: Self-pay | Admitting: Adult Health

## 2013-12-05 NOTE — Telephone Encounter (Signed)
Left message that A1c is 6.1 which is up from 5.5 a year ago, call me Monday and that she is at increased risk of developing diabetes

## 2013-12-09 ENCOUNTER — Telehealth: Payer: Self-pay | Admitting: Adult Health

## 2013-12-09 NOTE — Telephone Encounter (Signed)
Pt aware of A1c will increase walking and decrease carbs can recheck in 3-6 months

## 2014-04-07 ENCOUNTER — Ambulatory Visit (INDEPENDENT_AMBULATORY_CARE_PROVIDER_SITE_OTHER): Payer: Medicare Other | Admitting: Family Medicine

## 2014-04-07 ENCOUNTER — Encounter: Payer: Self-pay | Admitting: Family Medicine

## 2014-04-07 VITALS — Temp 98.6°F | Ht 72.0 in | Wt 186.8 lb

## 2014-04-07 DIAGNOSIS — K5909 Other constipation: Secondary | ICD-10-CM | POA: Diagnosis not present

## 2014-04-07 DIAGNOSIS — E785 Hyperlipidemia, unspecified: Secondary | ICD-10-CM

## 2014-04-07 DIAGNOSIS — R7302 Impaired glucose tolerance (oral): Secondary | ICD-10-CM

## 2014-04-07 DIAGNOSIS — Z79899 Other long term (current) drug therapy: Secondary | ICD-10-CM

## 2014-04-07 DIAGNOSIS — R7301 Impaired fasting glucose: Secondary | ICD-10-CM | POA: Diagnosis not present

## 2014-04-07 DIAGNOSIS — R634 Abnormal weight loss: Secondary | ICD-10-CM

## 2014-04-07 MED ORDER — ONDANSETRON 4 MG PO TBDP: 4.0000 mg | ORAL_TABLET | Freq: Three times a day (TID) | ORAL | Status: DC | PRN

## 2014-04-07 NOTE — Progress Notes (Signed)
   Subjective:    Patient ID: Erin Wall, female    DOB: 01/11/1946, 69 y.o.   MRN: [id number]  HPI Patient arrives with complaint of vomiting, diarrhea and abd pain since yest.  No further vomiting or diarrhea today but nagging tummy ache  Pt had constipation over the past week history of colon polyps. Due for screening colonoscopy next year. Requests a new GI doctor  Took it and developed vomited at least three or four  Took chicken and similar  Diarrhea a short while later  No major discomfort prior   No fever no chills  Patient also goes into great description of her weight loss. For several years she has been coming here complaining of weight loss. All the time patient's weight stayed within 6 pounds. In addition the patient was clinically obese during all of her concerns. Often this revolved around worries about cancer.  Was told by her OB/GYN that she had significant hyperglycemia. Attended a class. And a few months ago started on a tight diabetes diet. Now has lost 20 pounds. She is worried this represents cancer. Review of Systems No headache no chest pain no change in rash no nausea or diaphoresis    Objective:   Physical Exam Weight down 18 pounds compared to last fall. Of note last falls weight was stable over prior 3 years. Vital stable HEENT moderate nasal congestion lungs clear heart regular rhythm abdomen hyperactive bowel sounds no discrete tenderness. Ration very anxious appearing       Assessment & Plan:  Impression 1 acute viral syndrome #2 weight loss likely related to diet 4 glucose intolerance. Patient convinced that it is something serious. See prior notes. #3 change in bowel habits. Plan GI referral at patient request. For assessment of constipation and change in bowel habits. Acute care for gastroenteritis prescribed. WSL

## 2014-04-14 LAB — CBC WITH DIFFERENTIAL/PLATELET
Basophils Absolute: 0 10*3/uL (ref 0.0–0.1)
Basophils Relative: 0 % (ref 0–1)
EOS PCT: 3 % (ref 0–5)
Eosinophils Absolute: 0.1 10*3/uL (ref 0.0–0.7)
HCT: 42.3 % (ref 36.0–46.0)
Hemoglobin: 13.7 g/dL (ref 12.0–15.0)
LYMPHS ABS: 2.2 10*3/uL (ref 0.7–4.0)
Lymphocytes Relative: 57 % — ABNORMAL HIGH (ref 12–46)
MCH: 28.6 pg (ref 26.0–34.0)
MCHC: 32.4 g/dL (ref 30.0–36.0)
MCV: 88.3 fL (ref 78.0–100.0)
MONO ABS: 0.2 10*3/uL (ref 0.1–1.0)
MONOS PCT: 6 % (ref 3–12)
MPV: 9.4 fL (ref 8.6–12.4)
Neutro Abs: 1.3 10*3/uL — ABNORMAL LOW (ref 1.7–7.7)
Neutrophils Relative %: 34 % — ABNORMAL LOW (ref 43–77)
PLATELETS: 365 10*3/uL (ref 150–400)
RBC: 4.79 MIL/uL (ref 3.87–5.11)
RDW: 14.1 % (ref 11.5–15.5)
WBC: 3.9 10*3/uL — ABNORMAL LOW (ref 4.0–10.5)

## 2014-04-14 LAB — BASIC METABOLIC PANEL
BUN: 15 mg/dL (ref 6–23)
CHLORIDE: 106 meq/L (ref 96–112)
CO2: 26 meq/L (ref 19–32)
CREATININE: 0.99 mg/dL (ref 0.50–1.10)
Calcium: 10.3 mg/dL (ref 8.4–10.5)
Glucose, Bld: 82 mg/dL (ref 70–99)
Potassium: 4.8 mEq/L (ref 3.5–5.3)
Sodium: 141 mEq/L (ref 135–145)

## 2014-04-14 LAB — HEPATIC FUNCTION PANEL
ALK PHOS: 51 U/L (ref 39–117)
ALT: 19 U/L (ref 0–35)
AST: 26 U/L (ref 0–37)
Albumin: 4 g/dL (ref 3.5–5.2)
BILIRUBIN INDIRECT: 0.5 mg/dL (ref 0.2–1.2)
Bilirubin, Direct: 0.1 mg/dL (ref 0.0–0.3)
TOTAL PROTEIN: 6.5 g/dL (ref 6.0–8.3)
Total Bilirubin: 0.6 mg/dL (ref 0.2–1.2)

## 2014-04-14 LAB — HEMOGLOBIN A1C
HEMOGLOBIN A1C: 6.1 % — AB (ref <5.7)
Mean Plasma Glucose: 128 mg/dL — ABNORMAL HIGH (ref <117)

## 2014-04-14 LAB — LIPID PANEL
CHOL/HDL RATIO: 4.5 ratio
Cholesterol: 210 mg/dL — ABNORMAL HIGH (ref 0–200)
HDL: 47 mg/dL (ref >=46)
LDL CALC: 150 mg/dL — AB (ref 0–99)
TRIGLYCERIDES: 64 mg/dL (ref <150)
VLDL: 13 mg/dL (ref 0–40)

## 2014-04-14 LAB — TSH: TSH: 0.425 u[IU]/mL (ref 0.350–4.500)

## 2014-06-22 ENCOUNTER — Other Ambulatory Visit: Payer: Self-pay | Admitting: Gastroenterology

## 2014-06-22 DIAGNOSIS — R634 Abnormal weight loss: Secondary | ICD-10-CM

## 2014-06-26 ENCOUNTER — Ambulatory Visit
Admission: RE | Admit: 2014-06-26 | Discharge: 2014-06-26 | Disposition: A | Discharge status: Home / Self Care | Payer: PRIVATE HEALTH INSURANCE | Source: Ambulatory Visit | Attending: Gastroenterology | Admitting: Gastroenterology

## 2014-06-26 DIAGNOSIS — R634 Abnormal weight loss: Secondary | ICD-10-CM

## 2014-06-26 MED ORDER — IOHEXOL 300 MG/ML  SOLN
100.0000 mL | Freq: Once | INTRAMUSCULAR | Status: AC | PRN
  Administered 2014-06-26: 100 mL via INTRAVENOUS

## 2014-07-15 LAB — HM COLONOSCOPY

## 2014-09-07 ENCOUNTER — Encounter: Payer: Self-pay | Admitting: Internal Medicine

## 2014-09-14 ENCOUNTER — Other Ambulatory Visit: Payer: Self-pay | Admitting: Adult Health

## 2014-09-14 DIAGNOSIS — Z1231 Encounter for screening mammogram for malignant neoplasm of breast: Secondary | ICD-10-CM

## 2014-10-01 ENCOUNTER — Telehealth: Payer: Self-pay | Admitting: Family Medicine

## 2014-10-01 DIAGNOSIS — R7301 Impaired fasting glucose: Secondary | ICD-10-CM

## 2014-10-01 DIAGNOSIS — Z79899 Other long term (current) drug therapy: Secondary | ICD-10-CM

## 2014-10-01 DIAGNOSIS — R5383 Other fatigue: Secondary | ICD-10-CM

## 2014-10-01 DIAGNOSIS — I10 Essential (primary) hypertension: Secondary | ICD-10-CM

## 2014-10-01 NOTE — Telephone Encounter (Signed)
Notified patient bloodwork has been ordered.  

## 2014-10-01 NOTE — Telephone Encounter (Signed)
Pt is requesting lab orders to be sent over. Last labs per epic were: lipid,hepatic,bmp,a1c,tsh,and cbc on 04/13/14

## 2014-10-01 NOTE — Telephone Encounter (Signed)
Rep same 

## 2014-10-15 ENCOUNTER — Encounter: Payer: Self-pay | Admitting: Family Medicine

## 2014-10-15 ENCOUNTER — Ambulatory Visit (INDEPENDENT_AMBULATORY_CARE_PROVIDER_SITE_OTHER): Payer: Medicare Other | Admitting: Family Medicine

## 2014-10-15 VITALS — BP 128/78 | Ht 72.0 in | Wt 188.0 lb

## 2014-10-15 DIAGNOSIS — R7302 Impaired glucose tolerance (oral): Secondary | ICD-10-CM

## 2014-10-15 DIAGNOSIS — R634 Abnormal weight loss: Secondary | ICD-10-CM | POA: Diagnosis not present

## 2014-10-15 DIAGNOSIS — E782 Mixed hyperlipidemia: Secondary | ICD-10-CM | POA: Diagnosis not present

## 2014-10-15 DIAGNOSIS — Z23 Encounter for immunization: Secondary | ICD-10-CM | POA: Diagnosis not present

## 2014-10-15 LAB — HEPATIC FUNCTION PANEL
ALBUMIN: 4.2 g/dL (ref 3.6–4.8)
ALT: 19 IU/L (ref 0–32)
AST: 23 IU/L (ref 0–40)
Alkaline Phosphatase: 58 IU/L (ref 39–117)
BILIRUBIN TOTAL: 0.4 mg/dL (ref 0.0–1.2)
Bilirubin, Direct: 0.11 mg/dL (ref 0.00–0.40)
Total Protein: 6.7 g/dL (ref 6.0–8.5)

## 2014-10-15 LAB — CBC WITH DIFFERENTIAL/PLATELET
Basophils Absolute: 0 10*3/uL (ref 0.0–0.2)
Basos: 1 %
EOS (ABSOLUTE): 0.1 10*3/uL (ref 0.0–0.4)
EOS: 3 %
HEMATOCRIT: 43 % (ref 34.0–46.6)
HEMOGLOBIN: 14.3 g/dL (ref 11.1–15.9)
IMMATURE GRANS (ABS): 0 10*3/uL (ref 0.0–0.1)
Immature Granulocytes: 0 %
LYMPHS: 54 %
Lymphocytes Absolute: 2.3 10*3/uL (ref 0.7–3.1)
MCH: 28.4 pg (ref 26.6–33.0)
MCHC: 33.3 g/dL (ref 31.5–35.7)
MCV: 85 fL (ref 79–97)
Monocytes Absolute: 0.2 10*3/uL (ref 0.1–0.9)
Monocytes: 5 %
Neutrophils Absolute: 1.6 10*3/uL (ref 1.4–7.0)
Neutrophils: 37 %
Platelets: 332 10*3/uL (ref 150–379)
RBC: 5.04 x10E6/uL (ref 3.77–5.28)
RDW: 14.4 % (ref 12.3–15.4)
WBC: 4.2 10*3/uL (ref 3.4–10.8)

## 2014-10-15 LAB — LIPID PANEL
Chol/HDL Ratio: 4.2 ratio units (ref 0.0–4.4)
Cholesterol, Total: 235 mg/dL — ABNORMAL HIGH (ref 100–199)
HDL: 56 mg/dL (ref >39)
LDL CALC: 154 mg/dL — AB (ref 0–99)
TRIGLYCERIDES: 123 mg/dL (ref 0–149)
VLDL Cholesterol Cal: 25 mg/dL (ref 5–40)

## 2014-10-15 LAB — BASIC METABOLIC PANEL
BUN / CREAT RATIO: 13 (ref 11–26)
BUN: 14 mg/dL (ref 8–27)
CALCIUM: 10.2 mg/dL (ref 8.7–10.3)
CO2: 24 mmol/L (ref 18–29)
Chloride: 104 mmol/L (ref 97–108)
Creatinine, Ser: 1.05 mg/dL — ABNORMAL HIGH (ref 0.57–1.00)
GFR calc Af Amer: 63 mL/min/{1.73_m2} (ref >59)
GFR, EST NON AFRICAN AMERICAN: 55 mL/min/{1.73_m2} — AB (ref >59)
Glucose: 94 mg/dL (ref 65–99)
POTASSIUM: 5.5 mmol/L — AB (ref 3.5–5.2)
Sodium: 140 mmol/L (ref 134–144)

## 2014-10-15 LAB — HEMOGLOBIN A1C
Est. average glucose Bld gHb Est-mCnc: 123 mg/dL
Hgb A1c MFr Bld: 5.9 % — ABNORMAL HIGH (ref 4.8–5.6)

## 2014-10-15 LAB — TSH: TSH: 0.678 u[IU]/mL (ref 0.450–4.500)

## 2014-10-15 MED ORDER — ALPRAZOLAM 0.5 MG PO TABS: 0.5000 mg | ORAL_TABLET | Freq: Every evening | ORAL | Status: DC | PRN

## 2014-10-15 NOTE — Progress Notes (Signed)
   Subjective:    Patient ID: Erin Wall, female    DOB: 10-08-1945, 69 y.o.   MRN: [id number] Patient arrives office with multiple concerns. HPIpt concerned weight loss. Weight in 10/2013 was 201. Today 188.  Patient has had a chronic concern on the fact that she has lost some weight with a healthy diet. Please see multiple notes. Has lost 15 pounds or so last few years with much improved attention to diet.  History of glucose intolerance. Watching sugar intake. Has cut down sweets in the diet.    Pt had bloodwork done and would like to get results.  Had colonoscopy this summer. [facility] Recommend repeat 5 years.   Needs refill on xanax. Has not taken in awhile but would like to have some to take at bedtime. States that when she has caused difficulty sleeping at really helps a lot.  wqlking nearly every day now   Review of Systems No headache no chest pain no back pain no abdominal pain no change in bowel habits    Objective:   Physical Exam Alert vitals stable blood pressure good on repeat. H&T normal. Lungs clear. Heart regular in rhythm.       Assessment & Plan:  Impression #1 weight loss I feel this is secondary to patient's good dietary efforts at this time. I pointed out to her the computer actually declares her mildly obese in need of losing weight. #2 prediabetes clinically stable discussed #3 hyperlipidemia suboptimal in discussed patient to work on diet prefers no medication #4 insomnia discussed needs meds plan refill meds diet exercise discussed check every 6 months WSL

## 2014-11-30 ENCOUNTER — Ambulatory Visit (HOSPITAL_COMMUNITY)
Admission: RE | Admit: 2014-11-30 | Discharge: 2014-11-30 | Disposition: A | Discharge status: Home / Self Care | Payer: Medicare Other | Source: Ambulatory Visit | Attending: Adult Health | Admitting: Adult Health

## 2014-11-30 DIAGNOSIS — Z1231 Encounter for screening mammogram for malignant neoplasm of breast: Secondary | ICD-10-CM | POA: Diagnosis present

## 2014-12-07 ENCOUNTER — Ambulatory Visit (INDEPENDENT_AMBULATORY_CARE_PROVIDER_SITE_OTHER): Payer: Medicare Other | Admitting: Adult Health

## 2014-12-07 ENCOUNTER — Telehealth: Payer: Self-pay | Admitting: Adult Health

## 2014-12-07 ENCOUNTER — Encounter: Payer: Self-pay | Admitting: Adult Health

## 2014-12-07 VITALS — BP 140/78 | HR 74 | Ht 69.5 in | Wt 190.0 lb

## 2014-12-07 DIAGNOSIS — R319 Hematuria, unspecified: Secondary | ICD-10-CM | POA: Diagnosis not present

## 2014-12-07 DIAGNOSIS — Z780 Asymptomatic menopausal state: Secondary | ICD-10-CM

## 2014-12-07 DIAGNOSIS — Z1212 Encounter for screening for malignant neoplasm of rectum: Secondary | ICD-10-CM | POA: Diagnosis not present

## 2014-12-07 DIAGNOSIS — Z01419 Encounter for gynecological examination (general) (routine) without abnormal findings: Secondary | ICD-10-CM

## 2014-12-07 DIAGNOSIS — R350 Frequency of micturition: Secondary | ICD-10-CM | POA: Diagnosis not present

## 2014-12-07 HISTORY — DX: Asymptomatic menopausal state: Z78.0

## 2014-12-07 HISTORY — DX: Frequency of micturition: R35.0

## 2014-12-07 HISTORY — DX: Hematuria, unspecified: R31.9

## 2014-12-07 LAB — POCT URINALYSIS DIPSTICK
Glucose, UA: NEGATIVE
Leukocytes, UA: NEGATIVE
Nitrite, UA: NEGATIVE
Protein, UA: NEGATIVE

## 2014-12-07 LAB — HEMOCCULT GUIAC POC 1CARD (OFFICE): Fecal Occult Blood, POC: NEGATIVE

## 2014-12-07 NOTE — Patient Instructions (Addendum)
Physical in 1 year Mammogram yearly Colonoscopy per GI Labs with PCP Get dexa at [facility]

## 2014-12-07 NOTE — Telephone Encounter (Signed)
Pt aware of Dexa appt at [facility] 10/27 at 10:30 am

## 2014-12-07 NOTE — Progress Notes (Signed)
Patient ID: Erin Wall, female   DOB: May 19, 1945, 69 y.o.   MRN: [id number] History of Present Illness: Channie is a 69 year old black female in for a well woman gyn exam, she is sp hysterectomy.She complains of urinary frequency at night and occasional shooting pain in both under arms to breast.She says she feels like she has lost weight.She walks an hour every day.She go flu shot 10/15/14. PCP is Mickie Hillier.  Current Medications, Allergies, Past Medical History, Past Surgical History, Family History and Social History were reviewed in [employer] record.     Review of Systems: Patient denies any headaches, hearing loss, fatigue, blurred vision, shortness of breath, chest pain, abdominal pain, problems with bowel movements, or intercourse(not having sex). No joint pain or mood swings.    Physical Exam:BP 140/78 mmHg  Pulse 74  Ht 5' 9.5" (1.765 m)  Wt 190 lb (86.183 kg)  BMI 27.67 kg/m2 Urine has trace blood.  General:  Well developed, well nourished, no acute distress Skin:  Warm and dry Neck:  Midline trachea, normal thyroid, good ROM, no lymphadenopathy,no carotid bruits heard Lungs; Clear to auscultation bilaterally Breast:  No dominant palpable mass, retraction, or nipple discharge,had normal mammogram 11/30/14 Cardiovascular: Regular rate and rhythm Abdomen:  Soft, non tender, no hepatosplenomegaly Pelvic:  External genitalia is normal in appearance, no lesions.  The vagina is atrophic. Urethra has no lesions or masses. The cervix and uterus are absent   No adnexal masses or tenderness noted.Bladder is non tender, no masses felt. Rectal: Good sphincter tone, no polyps, has hemorrhoids.  Hemoccult negative.Had colonoscopy 07/31/14. Extremities/musculoskeletal:  No swelling or varicosities noted, no clubbing or cyanosis Psych:  No mood changes, alert and cooperative,seems happy Check weight in chart has not changed much but has lost inches in height, will  get DEXA.  Impression: Well woman gyn exam no pap Urinary frequency Hematuria PM    Plan: Physical in 1 year Mammogram yearly Colonoscopy per GI Labs with PCP Dexa 10/29 at 10:30 am at [facility]

## 2014-12-08 LAB — URINE CULTURE: Organism ID, Bacteria: NO GROWTH

## 2014-12-08 LAB — URINALYSIS, ROUTINE W REFLEX MICROSCOPIC
BILIRUBIN UA: NEGATIVE
Glucose, UA: NEGATIVE
Ketones, UA: NEGATIVE
Leukocytes, UA: NEGATIVE
Nitrite, UA: NEGATIVE
Protein, UA: NEGATIVE
RBC, UA: NEGATIVE
SPEC GRAV UA: 1.028 (ref 1.005–1.030)
Urobilinogen, Ur: 0.2 mg/dL (ref 0.2–1.0)
pH, UA: 6 (ref 5.0–7.5)

## 2014-12-09 ENCOUNTER — Telehealth: Payer: Self-pay | Admitting: Adult Health

## 2014-12-09 NOTE — Telephone Encounter (Signed)
Pt aware of urine results 

## 2014-12-10 ENCOUNTER — Telehealth: Payer: Self-pay | Admitting: Adult Health

## 2014-12-10 ENCOUNTER — Ambulatory Visit (HOSPITAL_COMMUNITY)
Admission: RE | Admit: 2014-12-10 | Discharge: 2014-12-10 | Disposition: A | Discharge status: Home / Self Care | Payer: Medicare Other | Source: Ambulatory Visit | Attending: Adult Health | Admitting: Adult Health

## 2014-12-10 DIAGNOSIS — Z780 Asymptomatic menopausal state: Secondary | ICD-10-CM | POA: Diagnosis present

## 2014-12-10 NOTE — Telephone Encounter (Signed)
Left message DEXA was normal

## 2015-05-10 ENCOUNTER — Telehealth: Payer: Self-pay | Admitting: Family Medicine

## 2015-05-10 DIAGNOSIS — R5383 Other fatigue: Secondary | ICD-10-CM

## 2015-05-10 DIAGNOSIS — Z79899 Other long term (current) drug therapy: Secondary | ICD-10-CM

## 2015-05-10 DIAGNOSIS — E785 Hyperlipidemia, unspecified: Secondary | ICD-10-CM

## 2015-05-10 DIAGNOSIS — R7301 Impaired fasting glucose: Secondary | ICD-10-CM

## 2015-05-10 NOTE — Telephone Encounter (Signed)
Rep same 

## 2015-05-10 NOTE — Telephone Encounter (Signed)
Blood work ordered in EPIC. Patient notified. 

## 2015-05-10 NOTE — Telephone Encounter (Signed)
Pt has appt this Friday wants BW so she can do it before her appt   Last labs 10/14/14  CBC, TSH, A1C, BMP, Hep, Lip

## 2015-05-12 LAB — CBC WITH DIFFERENTIAL/PLATELET
BASOS ABS: 0 10*3/uL (ref 0.0–0.2)
Basos: 0 %
EOS (ABSOLUTE): 0.1 10*3/uL (ref 0.0–0.4)
EOS: 2 %
HEMATOCRIT: 41.9 % (ref 34.0–46.6)
Hemoglobin: 13.6 g/dL (ref 11.1–15.9)
IMMATURE GRANULOCYTES: 0 %
Immature Grans (Abs): 0 10*3/uL (ref 0.0–0.1)
Lymphocytes Absolute: 2.1 10*3/uL (ref 0.7–3.1)
Lymphs: 54 %
MCH: 28.2 pg (ref 26.6–33.0)
MCHC: 32.5 g/dL (ref 31.5–35.7)
MCV: 87 fL (ref 79–97)
MONOS ABS: 0.2 10*3/uL (ref 0.1–0.9)
Monocytes: 5 %
NEUTROS PCT: 39 %
Neutrophils Absolute: 1.6 10*3/uL (ref 1.4–7.0)
PLATELETS: 321 10*3/uL (ref 150–379)
RBC: 4.83 x10E6/uL (ref 3.77–5.28)
RDW: 14.1 % (ref 12.3–15.4)
WBC: 3.9 10*3/uL (ref 3.4–10.8)

## 2015-05-12 LAB — TSH: TSH: 0.988 u[IU]/mL (ref 0.450–4.500)

## 2015-05-12 LAB — HEMOGLOBIN A1C
ESTIMATED AVERAGE GLUCOSE: 120 mg/dL
HEMOGLOBIN A1C: 5.8 % — AB (ref 4.8–5.6)

## 2015-05-12 LAB — BASIC METABOLIC PANEL
BUN / CREAT RATIO: 10 — AB (ref 11–26)
BUN: 10 mg/dL (ref 8–27)
CALCIUM: 10 mg/dL (ref 8.7–10.3)
CO2: 24 mmol/L (ref 18–29)
Chloride: 104 mmol/L (ref 96–106)
Creatinine, Ser: 1.01 mg/dL — ABNORMAL HIGH (ref 0.57–1.00)
GFR, EST AFRICAN AMERICAN: 66 mL/min/{1.73_m2} (ref >59)
GFR, EST NON AFRICAN AMERICAN: 57 mL/min/{1.73_m2} — AB (ref >59)
Glucose: 93 mg/dL (ref 65–99)
POTASSIUM: 5.3 mmol/L — AB (ref 3.5–5.2)
SODIUM: 140 mmol/L (ref 134–144)

## 2015-05-12 LAB — LIPID PANEL
Chol/HDL Ratio: 3.8 ratio units (ref 0.0–4.4)
Cholesterol, Total: 228 mg/dL — ABNORMAL HIGH (ref 100–199)
HDL: 60 mg/dL (ref >39)
LDL CALC: 151 mg/dL — AB (ref 0–99)
TRIGLYCERIDES: 83 mg/dL (ref 0–149)
VLDL Cholesterol Cal: 17 mg/dL (ref 5–40)

## 2015-05-12 LAB — HEPATIC FUNCTION PANEL
ALBUMIN: 4.2 g/dL (ref 3.6–4.8)
ALT: 21 IU/L (ref 0–32)
AST: 30 IU/L (ref 0–40)
Alkaline Phosphatase: 56 IU/L (ref 39–117)
BILIRUBIN TOTAL: 0.4 mg/dL (ref 0.0–1.2)
Bilirubin, Direct: 0.12 mg/dL (ref 0.00–0.40)
TOTAL PROTEIN: 6.8 g/dL (ref 6.0–8.5)

## 2015-05-14 ENCOUNTER — Encounter: Payer: Self-pay | Admitting: Family Medicine

## 2015-05-14 ENCOUNTER — Ambulatory Visit (INDEPENDENT_AMBULATORY_CARE_PROVIDER_SITE_OTHER): Payer: Medicare Other | Admitting: Family Medicine

## 2015-05-14 VITALS — BP 130/80 | Ht 72.0 in | Wt 187.2 lb

## 2015-05-14 DIAGNOSIS — E785 Hyperlipidemia, unspecified: Secondary | ICD-10-CM | POA: Diagnosis not present

## 2015-05-14 DIAGNOSIS — R7301 Impaired fasting glucose: Secondary | ICD-10-CM | POA: Diagnosis not present

## 2015-05-14 DIAGNOSIS — I10 Essential (primary) hypertension: Secondary | ICD-10-CM | POA: Diagnosis not present

## 2015-05-14 DIAGNOSIS — R634 Abnormal weight loss: Secondary | ICD-10-CM | POA: Diagnosis not present

## 2015-05-14 MED ORDER — ALPRAZOLAM 0.5 MG PO TABS: 0.5000 mg | ORAL_TABLET | Freq: Every evening | ORAL | Status: DC | PRN

## 2015-05-14 NOTE — Progress Notes (Signed)
Subjective:    Patient ID: Erin Wall, female    DOB: [birth year], 70 y.o.   MRN: [id number]  HPI Patient in today for a 6 month follow up for weight loss. Had recent labs drawn on 05/11/15.   States no other concerns this visit. Results for orders placed or performed in visit on 05/10/15  CBC with Differential/Platelet  Result Value Ref Range   WBC 3.9 3.4 - 10.8 x10E3/uL   RBC 4.83 3.77 - 5.28 x10E6/uL   Hemoglobin 13.6 11.1 - 15.9 g/dL   Hematocrit 41.9 34.0 - 46.6 %   MCV 87 79 - 97 fL   MCH 28.2 26.6 - 33.0 pg   MCHC 32.5 31.5 - 35.7 g/dL   RDW 14.1 12.3 - 15.4 %   Platelets 321 150 - 379 x10E3/uL   Neutrophils 39 %   Lymphs 54 %   Monocytes 5 %   Eos 2 %   Basos 0 %   Neutrophils Absolute 1.6 1.4 - 7.0 x10E3/uL   Lymphocytes Absolute 2.1 0.7 - 3.1 x10E3/uL   Monocytes Absolute 0.2 0.1 - 0.9 x10E3/uL   EOS (ABSOLUTE) 0.1 0.0 - 0.4 x10E3/uL   Basophils Absolute 0.0 0.0 - 0.2 x10E3/uL   Immature Granulocytes 0 %   Immature Grans (Abs) 0.0 0.0 - 0.1 x10E3/uL  TSH  Result Value Ref Range   TSH 0.988 0.450 - 4.500 uIU/mL  Hemoglobin A1c  Result Value Ref Range   Hgb A1c MFr Bld 5.8 (H) 4.8 - 5.6 %   Est. average glucose Bld gHb Est-mCnc 120 mg/dL  Basic metabolic panel  Result Value Ref Range   Glucose 93 65 - 99 mg/dL   BUN 10 8 - 27 mg/dL   Creatinine, Ser 1.01 (H) 0.57 - 1.00 mg/dL   GFR calc non Af Amer 57 (L) >59 mL/min/1.73   GFR calc Af Amer 66 >59 mL/min/1.73   BUN/Creatinine Ratio 10 (L) 11 - 26   Sodium 140 134 - 144 mmol/L   Potassium 5.3 (H) 3.5 - 5.2 mmol/L   Chloride 104 96 - 106 mmol/L   CO2 24 18 - 29 mmol/L   Calcium 10.0 8.7 - 10.3 mg/dL  Hepatic function panel  Result Value Ref Range   Total Protein 6.8 6.0 - 8.5 g/dL   Albumin 4.2 3.6 - 4.8 g/dL   Bilirubin Total 0.4 0.0 - 1.2 mg/dL   Bilirubin, Direct 0.12 0.00 - 0.40 mg/dL   Alkaline Phosphatase 56 39 - 117 IU/L   AST 30 0 - 40 IU/L   ALT 21 0 - 32 IU/L  Lipid panel  Result  Value Ref Range   Cholesterol, Total 228 (H) 100 - 199 mg/dL   Triglycerides 83 0 - 149 mg/dL   HDL 60 >39 mg/dL   VLDL Cholesterol Cal 17 5 - 40 mg/dL   LDL Calculated 151 (H) 0 - 99 mg/dL   Chol/HDL Ratio 3.8 0.0 - 4.4 ratio units   Once again patient goes into excruciating detail regarding her weight loss. Please see prior notes. Patient has lost weight since working hard on diet. Now everyone tells her that she works "too thin". She has lost by review of weights exactly 1 pound in the last 6 months.  States ongoing need for Xanax at night to help with anxiety and insomnia. Would like to maintain.  Compliant with. But taking only 1 g per day.  Review of Systems No headache, no major weight loss or weight gain,  no chest pain no back pain abdominal pain no change in bowel habits complete ROS otherwise negative     Objective:   Physical Exam  Alert no acute distress HEENT normal lungs clear heart rare rhythm ankles without edema      Assessment & Plan:  Impression weight loss stabilized. Patient's body image and cysts on her trying to get back to her prior obesity self. I have discouraged her from doing this. #2 hyperlipidemia discussed #3 anxiety/insomnia with ongoing need for meds #4 hypertension well controlleds off of meds plan 25 minutes spent most in rediscussion. Medications refilled diet exercise discussed check in 6 months work on cutting fats down diet. With excellent HDL we'll hold off on statin particularly with prior sensitivities to Pravachol etc.

## 2015-09-07 ENCOUNTER — Other Ambulatory Visit: Payer: Self-pay | Admitting: Family Medicine

## 2015-09-07 NOTE — Telephone Encounter (Signed)
Ok plus five ref 

## 2015-10-19 ENCOUNTER — Telehealth: Payer: Self-pay | Admitting: Family Medicine

## 2015-10-19 DIAGNOSIS — R7303 Prediabetes: Secondary | ICD-10-CM

## 2015-10-19 DIAGNOSIS — Z13220 Encounter for screening for lipoid disorders: Secondary | ICD-10-CM

## 2015-10-19 DIAGNOSIS — Z79899 Other long term (current) drug therapy: Secondary | ICD-10-CM

## 2015-10-19 NOTE — Telephone Encounter (Signed)
Patient wants to know if she needs blood work done before her appointment on 10/25/15?

## 2015-10-19 NOTE — Telephone Encounter (Signed)
Lip liv m7 A1c  

## 2015-10-19 NOTE — Telephone Encounter (Signed)
Orders put in. Pt notified.  

## 2015-10-23 LAB — HEPATIC FUNCTION PANEL
ALBUMIN: 4.2 g/dL (ref 3.6–4.8)
ALT: 21 IU/L (ref 0–32)
AST: 31 IU/L (ref 0–40)
Alkaline Phosphatase: 55 IU/L (ref 39–117)
Bilirubin Total: 0.4 mg/dL (ref 0.0–1.2)
Bilirubin, Direct: 0.16 mg/dL (ref 0.00–0.40)
TOTAL PROTEIN: 6.9 g/dL (ref 6.0–8.5)

## 2015-10-23 LAB — BASIC METABOLIC PANEL
BUN / CREAT RATIO: 14 (ref 12–28)
BUN: 14 mg/dL (ref 8–27)
CALCIUM: 9.9 mg/dL (ref 8.7–10.3)
CHLORIDE: 104 mmol/L (ref 96–106)
CO2: 22 mmol/L (ref 18–29)
CREATININE: 1.03 mg/dL — AB (ref 0.57–1.00)
GFR calc Af Amer: 64 mL/min/{1.73_m2} (ref >59)
GFR, EST NON AFRICAN AMERICAN: 56 mL/min/{1.73_m2} — AB (ref >59)
Glucose: 94 mg/dL (ref 65–99)
Potassium: 4.7 mmol/L (ref 3.5–5.2)
SODIUM: 142 mmol/L (ref 134–144)

## 2015-10-23 LAB — LIPID PANEL
CHOL/HDL RATIO: 3.9 ratio (ref 0.0–4.4)
Cholesterol, Total: 252 mg/dL — ABNORMAL HIGH (ref 100–199)
HDL: 65 mg/dL (ref >39)
LDL CALC: 173 mg/dL — AB (ref 0–99)
Triglycerides: 71 mg/dL (ref 0–149)
VLDL CHOLESTEROL CAL: 14 mg/dL (ref 5–40)

## 2015-10-23 LAB — HEMOGLOBIN A1C
Est. average glucose Bld gHb Est-mCnc: 117 mg/dL
HEMOGLOBIN A1C: 5.7 % — AB (ref 4.8–5.6)

## 2015-10-25 ENCOUNTER — Encounter: Payer: Self-pay | Admitting: Family Medicine

## 2015-10-25 ENCOUNTER — Ambulatory Visit (INDEPENDENT_AMBULATORY_CARE_PROVIDER_SITE_OTHER): Payer: Medicare Other | Admitting: Family Medicine

## 2015-10-25 VITALS — BP 140/88 | Ht 72.0 in | Wt 180.0 lb

## 2015-10-25 DIAGNOSIS — R634 Abnormal weight loss: Secondary | ICD-10-CM

## 2015-10-25 DIAGNOSIS — I10 Essential (primary) hypertension: Secondary | ICD-10-CM

## 2015-10-25 DIAGNOSIS — Z23 Encounter for immunization: Secondary | ICD-10-CM | POA: Diagnosis not present

## 2015-10-25 DIAGNOSIS — E785 Hyperlipidemia, unspecified: Secondary | ICD-10-CM

## 2015-10-25 MED ORDER — ATORVASTATIN CALCIUM 20 MG PO TABS: 20.0000 mg | ORAL_TABLET | Freq: Every day | ORAL | 5 refills | Status: DC

## 2015-10-25 NOTE — Progress Notes (Signed)
Subjective:    Patient ID: Erin Wall, female    DOB: 1945-07-12, 70 y.o.   MRN: [id number]  Hyperlipidemia  This is a chronic problem. There are no compliance problems.   Patient continues to take lipid medication regularly. No obvious side effects from it. Generally does not miss a dose. Prior blood work results are reviewed with patient. Patient continues to work on fat intake in diet   Patient once again goes to great length about her worry about weight loss. I review once again shows 6 pound weight loss in the last 18 months. She reminds me that she had a sibling who developed cancer. All the doctors missed it. And once they found that it was too late. Patient up-to-date on her preventive features.  Ongoing challenges with anxiety and insomnia. Uses a 74 M on his spare when necessary basis. No obvious side effects with this. Patient had recent lab work drawn on 10/22/15.  Results for orders placed or performed in visit on 10/19/15  Lipid panel  Result Value Ref Range   Cholesterol, Total 252 (H) 100 - 199 mg/dL   Triglycerides 71 0 - 149 mg/dL   HDL 65 >39 mg/dL   VLDL Cholesterol Cal 14 5 - 40 mg/dL   LDL Calculated 173 (H) 0 - 99 mg/dL   Chol/HDL Ratio 3.9 0.0 - 4.4 ratio units  Hepatic function panel  Result Value Ref Range   Total Protein 6.9 6.0 - 8.5 g/dL   Albumin 4.2 3.6 - 4.8 g/dL   Bilirubin Total 0.4 0.0 - 1.2 mg/dL   Bilirubin, Direct 0.16 0.00 - 0.40 mg/dL   Alkaline Phosphatase 55 39 - 117 IU/L   AST 31 0 - 40 IU/L   ALT 21 0 - 32 IU/L  Basic metabolic panel  Result Value Ref Range   Glucose 94 65 - 99 mg/dL   BUN 14 8 - 27 mg/dL   Creatinine, Ser 1.03 (H) 0.57 - 1.00 mg/dL   GFR calc non Af Amer 56 (L) >59 mL/min/1.73   GFR calc Af Amer 64 >59 mL/min/1.73   BUN/Creatinine Ratio 14 12 - 28   Sodium 142 134 - 144 mmol/L   Potassium 4.7 3.5 - 5.2 mmol/L   Chloride 104 96 - 106 mmol/L   CO2 22 18 - 29 mmol/L   Calcium 9.9 8.7 - 10.3 mg/dL    Hemoglobin A1c  Result Value Ref Range   Hgb A1c MFr Bld 5.7 (H) 4.8 - 5.6 %   Est. average glucose Bld gHb Est-mCnc 117 mg/dL   Pt walking regulaly   cheate dsome with the diet  Xanax qhs prn sleep     Review of Systems No headache, no major weight loss or weight gain, no chest pain no back pain abdominal pain no change in bowel habits complete ROS otherwise negative     Objective:   Physical Exam Alert vitals stable, NAD. Blood pressure good on repeat. HEENT normal. Lungs clear. Heart regular rate and rhythm.        Assessment & Plan:  Impression 1 hyperlipidemia long discussion held. Patient would like to get her LDL down. I think this is reasonable. Had some nonspecific side effects of Pravachol we'll try lovastatin. #2 insomnia/anxiety ongoing with use of Xanax on a when necessary basis #3 ongoing weight loss fears really has stabilized. And I think is due to patient's attention to regular diet and exercise once again discussed plan flu shot today. Initiate Lipitor.  Follow up as scheduled. Diet exercise discussed WSL

## 2015-11-15 ENCOUNTER — Ambulatory Visit: Payer: Medicare Other | Admitting: Family Medicine

## 2015-11-30 ENCOUNTER — Other Ambulatory Visit: Payer: Self-pay | Admitting: Adult Health

## 2015-11-30 DIAGNOSIS — Z1231 Encounter for screening mammogram for malignant neoplasm of breast: Secondary | ICD-10-CM

## 2015-12-02 ENCOUNTER — Ambulatory Visit (HOSPITAL_COMMUNITY)
Admission: RE | Admit: 2015-12-02 | Discharge: 2015-12-02 | Disposition: A | Discharge status: Home / Self Care | Payer: Medicare Other | Source: Ambulatory Visit | Attending: Adult Health | Admitting: Adult Health

## 2015-12-02 ENCOUNTER — Ambulatory Visit (HOSPITAL_COMMUNITY): Payer: Medicare Other

## 2015-12-02 ENCOUNTER — Other Ambulatory Visit: Payer: Self-pay | Admitting: Adult Health

## 2015-12-02 DIAGNOSIS — Z1231 Encounter for screening mammogram for malignant neoplasm of breast: Secondary | ICD-10-CM | POA: Insufficient documentation

## 2015-12-14 ENCOUNTER — Ambulatory Visit (INDEPENDENT_AMBULATORY_CARE_PROVIDER_SITE_OTHER): Payer: Medicare Other | Admitting: Adult Health

## 2015-12-14 ENCOUNTER — Encounter: Payer: Self-pay | Admitting: Adult Health

## 2015-12-14 VITALS — BP 130/82 | HR 74 | Ht 69.5 in | Wt 188.0 lb

## 2015-12-14 DIAGNOSIS — Z1212 Encounter for screening for malignant neoplasm of rectum: Secondary | ICD-10-CM

## 2015-12-14 DIAGNOSIS — Z01419 Encounter for gynecological examination (general) (routine) without abnormal findings: Secondary | ICD-10-CM

## 2015-12-14 DIAGNOSIS — D229 Melanocytic nevi, unspecified: Secondary | ICD-10-CM

## 2015-12-14 LAB — HEMOCCULT GUIAC POC 1CARD (OFFICE): FECAL OCCULT BLD: NEGATIVE

## 2015-12-14 NOTE — Progress Notes (Signed)
Patient ID: Erin Wall, female   DOB: 12-04-1945, 70 y.o.   MRN: [id number] History of Present Illness:  [name] is a 70 year old black female in for well woman gyn exam,she is sp hysterectomy.She has noticed spots on breast, no itching.She has gotten flu shot already. PCP is Dr Mickie Hillier   Current Medications, Allergies, Past Medical History, Past Surgical History, Family History and Social History were reviewed in [place] record.     Review of Systems: Patient denies any headaches, hearing loss, fatigue, blurred vision, shortness of breath, chest pain, abdominal pain, problems with bowel movements, urination, or intercourse(not having sex). No joint pain or mood swings.    Physical Exam:BP 130/82 (BP Location: Right Arm, Patient Position: Sitting, Cuff Size: Normal)   Pulse 74   Ht 5' 9.5" (1.765 m)   Wt 188 lb (85.3 kg)   BMI 27.36 kg/m  General:  Well developed, well nourished, no acute distress Skin:  Warm and dry,increased number of moles on breast and trunk  Neck:  Midline trachea, normal thyroid, good ROM, no lymphadenopathy,no carotid bruits heard Lungs; Clear to auscultation bilaterally Breast:  No dominant palpable mass, retraction, or nipple discharge,+moles both breast Cardiovascular: Regular rate and rhythm Abdomen:  Soft, non tender, no hepatosplenomegaly Pelvic:  External genitalia is normal in appearance, no lesions.  The vagina is pale with loss of rugae and mositure. Urethra has no lesions or masses. The cervix and uterus are absent.  No adnexal masses or tenderness noted.Bladder is non tender, no masses felt. Rectal: Good sphincter tone, no polyps, or hemorrhoids felt.  Hemoccult negative. Extremities/musculoskeletal:  No swelling or varicosities noted, no clubbing or cyanosis Psych:  No mood changes, alert and cooperative,seems happy PHQ 2 score 1.  Impression: 1. Well woman exam with routine gynecological exam   2. Numerous  moles       Plan: Physical in 1 year Mammogram yearly Colonoscopy per GI Labs with PCP See dermatologist

## 2015-12-14 NOTE — Patient Instructions (Addendum)
Physical in 1 year Mammogram yearly Colonoscopy per GI Labs with PCP See dermatologist

## 2016-02-10 ENCOUNTER — Telehealth: Payer: Self-pay | Admitting: Family Medicine

## 2016-02-10 NOTE — Telephone Encounter (Signed)
Let pt know carolynb abnd I have spoken about her and reviewed her history and reviewed her concerns

## 2016-02-10 NOTE — Telephone Encounter (Signed)
Pt has an appt with Hoyle Sauer next Friday to discuss new things that are going on with her body. Pt had recent labs in Sept of this year. Pt is wanting to know if she needs to have any more labs for this appt.

## 2016-02-10 NOTE — Telephone Encounter (Signed)
Pt states she is concerned about having cancer because she has had weight loss. appt with carolyn but pt wants carolyn to review with dr Richardson Landry her history since this will be her first time with carolyn. Message sent to dr Richardson Landry and carolyn.

## 2016-02-10 NOTE — Telephone Encounter (Signed)
Does pt need blood work before appt?

## 2016-02-11 ENCOUNTER — Other Ambulatory Visit: Payer: Self-pay | Admitting: Pharmacist

## 2016-02-11 NOTE — Patient Outreach (Signed)
Outreach call to [facility] regarding medication adherence to atorvastatin. Called and spoke with patient. HIPAA identifiers verified and verbal consent received.  Erin Wall reports that she has been taking her atorvastatin as directed. Reports that she rarely misses a dose. Denies barriers to adherence such as cost or side effects. Reports that she does currently need to get the medication refilled. Let patient know that I will call [facility] on her behalf to have the medication refilled today. Patient reports that she will pick it up today or tomorrow.  Harlow Asa, PharmD, [facility] [phone]

## 2016-02-11 NOTE — Telephone Encounter (Signed)
No labs before appointment. She had them done in September. We will discuss any further testing that may need to be done during visit.

## 2016-02-11 NOTE — Telephone Encounter (Signed)
Patient notified no labs before appointment. She had them done in September. We will discuss any further testing that may need to be done during visit. Patient verbalized understanding.

## 2016-02-18 ENCOUNTER — Encounter: Payer: Self-pay | Admitting: Nurse Practitioner

## 2016-02-18 ENCOUNTER — Ambulatory Visit (INDEPENDENT_AMBULATORY_CARE_PROVIDER_SITE_OTHER): Payer: Medicare Other | Admitting: Nurse Practitioner

## 2016-02-18 VITALS — BP 158/92 | Ht 69.5 in | Wt 190.4 lb

## 2016-02-18 DIAGNOSIS — J329 Chronic sinusitis, unspecified: Secondary | ICD-10-CM

## 2016-02-18 DIAGNOSIS — M62838 Other muscle spasm: Secondary | ICD-10-CM | POA: Diagnosis not present

## 2016-02-18 MED ORDER — CHLORZOXAZONE 500 MG PO TABS: 500.0000 mg | ORAL_TABLET | Freq: Three times a day (TID) | ORAL | 0 refills | Status: DC | PRN

## 2016-02-18 MED ORDER — AZITHROMYCIN 250 MG PO TABS: ORAL_TABLET | ORAL | 0 refills | Status: DC

## 2016-02-18 NOTE — Patient Instructions (Addendum)
Conan Bowens at [employer] or [facility]

## 2016-02-20 ENCOUNTER — Encounter: Payer: Self-pay | Admitting: Nurse Practitioner

## 2016-02-20 NOTE — Progress Notes (Signed)
Subjective:  Presents for c/o pain in the r lateral neck area for the past 2 weeks. No history of injury. Thinks it may be related to how she slept. Radiates in to the lateral shoulder area; causes a tingling sensation at times. No weakness or numbness of right arm. Also c/o runny nose for past few days. Clear drainage. Occasional cough. No headache, sore throat, ear pain or fever.   Objective:   BP (!) 158/92   Ht 5' 9.5" (1.765 m)   Wt 190 lb 6.4 oz (86.4 kg)   BMI 27.71 kg/m  NAD. Alert, oriented. TMs mild clear effusion. Pharynx clear. Neck supple with mild anterior adenopathy. Lungs clear. Heart RRR. Tight tender muscles along right lateral neck. Can perform full ROM with tenderness with lateral movement. Hand and arm strength 5+ black. Sensation grossly intact. Normal range of motion of the right shoulder without tenderness.  Assessment:  Rhinosinusitis  Muscle spasms of neck  Plan:  Meds ordered this encounter  Medications  . azithromycin (ZITHROMAX Z-PAK) 250 MG tablet    Sig: Take 2 tablets (500 mg) on  Day 1,  followed by 1 tablet (250 mg) once daily on Days 2 through 5.    Dispense:  6 each    Refill:  0    Order Specific Question:   Supervising Provider    Answer:   Mikey Kirschner [[id number]]  . chlorzoxazone (PARAFON) 500 MG tablet    Sig: Take 1 tablet (500 mg total) by mouth 3 (three) times daily as needed for muscle spasms.    Dispense:  30 tablet    Refill:  0    Order Specific Question:   Supervising Provider    Answer:   Mikey Kirschner [[id number]]   OTC meds as directed for congestion and cough. Chlorzoxazone as directed, drowsiness precautions. Ice/heat applications to the neck area. Gentle stretching. Consider massage therapy. Call back if worsens or persists. Otherwise routine follow-up.  Signed, Katharine Look [place], [place], [facility] 02/20/2016 12:05 PM

## 2016-02-23 ENCOUNTER — Telehealth: Payer: Self-pay | Admitting: *Deleted

## 2016-02-23 NOTE — Telephone Encounter (Signed)
Called pt and let her know she needed pneumonia vaccine per carolyn. Pt states she will get it at [facility] tomorrow.

## 2016-03-31 ENCOUNTER — Other Ambulatory Visit: Payer: Self-pay | Admitting: Family Medicine

## 2016-04-03 NOTE — Telephone Encounter (Signed)
Six mo ok 

## 2016-04-21 ENCOUNTER — Telehealth: Payer: Self-pay | Admitting: Family Medicine

## 2016-04-21 DIAGNOSIS — E785 Hyperlipidemia, unspecified: Secondary | ICD-10-CM

## 2016-04-21 NOTE — Telephone Encounter (Signed)
Patient called requesting labs for upcoming appointment. Please advise?

## 2016-04-21 NOTE — Telephone Encounter (Signed)
Called patient and informed her that labs have been ordered.Patient verbalized understanding.

## 2016-04-21 NOTE — Telephone Encounter (Signed)
Lip liv 

## 2016-04-21 NOTE — Telephone Encounter (Signed)
Left message return call 04/21/2016

## 2016-04-21 NOTE — Addendum Note (Signed)
Addended by: Ofilia Neas R on: 04/21/2016 11:17 AM   Modules accepted: Orders

## 2016-04-23 LAB — HEPATIC FUNCTION PANEL
ALK PHOS: 57 IU/L (ref 39–117)
ALT: 21 IU/L (ref 0–32)
AST: 27 IU/L (ref 0–40)
Albumin: 4.1 g/dL (ref 3.5–4.8)
Bilirubin Total: 0.5 mg/dL (ref 0.0–1.2)
Bilirubin, Direct: 0.12 mg/dL (ref 0.00–0.40)
Total Protein: 6.6 g/dL (ref 6.0–8.5)

## 2016-04-23 LAB — LIPID PANEL
CHOLESTEROL TOTAL: 157 mg/dL (ref 100–199)
Chol/HDL Ratio: 2.9 ratio units (ref 0.0–4.4)
HDL: 55 mg/dL (ref >39)
LDL CALC: 89 mg/dL (ref 0–99)
Triglycerides: 64 mg/dL (ref 0–149)
VLDL CHOLESTEROL CAL: 13 mg/dL (ref 5–40)

## 2016-04-25 ENCOUNTER — Ambulatory Visit: Payer: Medicare Other | Admitting: Family Medicine

## 2016-05-02 ENCOUNTER — Ambulatory Visit (INDEPENDENT_AMBULATORY_CARE_PROVIDER_SITE_OTHER): Payer: Medicare Other | Admitting: Family Medicine

## 2016-05-02 ENCOUNTER — Encounter: Payer: Self-pay | Admitting: Family Medicine

## 2016-05-02 VITALS — BP 122/82 | Ht 69.5 in | Wt 187.0 lb

## 2016-05-02 DIAGNOSIS — E785 Hyperlipidemia, unspecified: Secondary | ICD-10-CM | POA: Diagnosis not present

## 2016-05-02 MED ORDER — ATORVASTATIN CALCIUM 20 MG PO TABS: 20.0000 mg | ORAL_TABLET | Freq: Every day | ORAL | 5 refills | Status: DC

## 2016-05-02 NOTE — Progress Notes (Signed)
   Subjective:    Patient ID: Erin Wall, female    DOB: [birth year], 71 y.o.   MRN: [id number]  Hyperlipidemia  This is a chronic problem. The current episode started more than 1 year ago. Treatments tried: lipitor. There are no compliance problems.    Results for orders placed or performed in visit on 04/21/16  Lipid panel  Result Value Ref Range   Cholesterol, Total 157 100 - 199 mg/dL   Triglycerides 64 0 - 149 mg/dL   HDL 55 >39 mg/dL   VLDL Cholesterol Cal 13 5 - 40 mg/dL   LDL Calculated 89 0 - 99 mg/dL   Chol/HDL Ratio 2.9 0.0 - 4.4 ratio units  Hepatic function panel  Result Value Ref Range   Total Protein 6.6 6.0 - 8.5 g/dL   Albumin 4.1 3.5 - 4.8 g/dL   Bilirubin Total 0.5 0.0 - 1.2 mg/dL   Bilirubin, Direct 0.12 0.00 - 0.40 mg/dL   Alkaline Phosphatase 57 39 - 117 IU/L   AST 27 0 - 40 IU/L   ALT 21 0 - 32 IU/L   Now on probiotics daily  wlking in the gym   Patient continues to take lipid medication regularly. No obvious side effects from it. Generally does not miss a dose. Prior blood work results are reviewed with patient. Patient continues to work on fat intake in diet   Review of Systems No headache, no major weight loss or weight gain, no chest pain no back pain abdominal pain no change in bowel habits complete ROS otherwise negative      Objective:   Physical Exam  Alert vitals stable, NAD. Blood pressure good on repeat. HEENT normal. Lungs clear. Heart regular rate and rhythm.      Assessment & Plan:  Impression 1 hyperlipidemia discussed at length prior blood work reviewed. To maintain same. Follow-up in 6 months diet exercise discussed

## 2016-10-04 ENCOUNTER — Telehealth: Payer: Self-pay | Admitting: Family Medicine

## 2016-10-04 DIAGNOSIS — E785 Hyperlipidemia, unspecified: Secondary | ICD-10-CM

## 2016-10-04 DIAGNOSIS — R5383 Other fatigue: Secondary | ICD-10-CM

## 2016-10-04 DIAGNOSIS — R7301 Impaired fasting glucose: Secondary | ICD-10-CM

## 2016-10-04 NOTE — Telephone Encounter (Signed)
Spoke with patient and informed her per Dr.Steve Luking- we are sending in orders for lab work. Patient verbalized understanding.

## 2016-10-04 NOTE — Telephone Encounter (Signed)
Last labs 04/22/16 liver lipid

## 2016-10-04 NOTE — Telephone Encounter (Signed)
Lip liv m7 A1c tsh cbc

## 2016-10-04 NOTE — Telephone Encounter (Signed)
Patient is seeing Dr. Richardson Landry on 10/24/16 and she is requesting lab work.

## 2016-10-08 ENCOUNTER — Other Ambulatory Visit: Payer: Self-pay | Admitting: Family Medicine

## 2016-10-10 NOTE — Telephone Encounter (Signed)
Ok plus five ref 

## 2016-10-23 ENCOUNTER — Other Ambulatory Visit: Payer: Self-pay | Admitting: Adult Health

## 2016-10-23 DIAGNOSIS — Z1231 Encounter for screening mammogram for malignant neoplasm of breast: Secondary | ICD-10-CM

## 2016-10-24 ENCOUNTER — Encounter: Payer: Self-pay | Admitting: Family Medicine

## 2016-10-24 ENCOUNTER — Ambulatory Visit (INDEPENDENT_AMBULATORY_CARE_PROVIDER_SITE_OTHER): Payer: Medicare Other | Admitting: Family Medicine

## 2016-10-24 VITALS — BP 126/72 | Ht 69.5 in | Wt 187.5 lb

## 2016-10-24 DIAGNOSIS — R7303 Prediabetes: Secondary | ICD-10-CM | POA: Diagnosis not present

## 2016-10-24 DIAGNOSIS — E785 Hyperlipidemia, unspecified: Secondary | ICD-10-CM | POA: Diagnosis not present

## 2016-10-24 DIAGNOSIS — I10 Essential (primary) hypertension: Secondary | ICD-10-CM

## 2016-10-24 DIAGNOSIS — R413 Other amnesia: Secondary | ICD-10-CM

## 2016-10-24 DIAGNOSIS — Z23 Encounter for immunization: Secondary | ICD-10-CM | POA: Diagnosis not present

## 2016-10-24 MED ORDER — ATORVASTATIN CALCIUM 20 MG PO TABS: 20.0000 mg | ORAL_TABLET | Freq: Every day | ORAL | 5 refills | Status: DC

## 2016-10-24 NOTE — Progress Notes (Signed)
   Subjective:    Patient ID: Erin Wall, female    DOB: 1945-02-27, 71 y.o.   MRN: [id number] Patient arrives office with several concerns Hyperlipidemia  The current episode started more than 1 year ago.   Patient has concerns of some forgetfulness.  Notes some challenges with memory, not major, but a little, Has not affected her activities of daily living. Has not gotten lost. Has not herself in any danger. Mainly having more difficulty remembering names and dates n fam hx of dementia until fa as he got older   Patient compliant with insomnia medication.until few mo ago, stopped and doing ok without it    Generally takes most nights. No obvious morning drowsiness. Definitely helps patient sleep. Without it patient states would not get a good nights rest.  eercise good but not great  Not walking as much as she did  Patient continues to take lipid medication regularly. No obvious side effects from it. Generally does not miss a dose. Prior blood work results are reviewed with patient. Patient continues to work on fat intake in diet   Review of Systems No headache, no major weight loss or weight gain, no chest pain no back pain abdominal pain no change in bowel habits complete ROS otherwise negative     Objective:   Physical Exam  Alert and oriented, vitals reviewed and stable, NAD ENT-TM's and ext canals WNL bilat via otoscopic exam Soft palate, tonsils and post pharynx WNL via oropharyngeal exam Neck-symmetric, no masses; thyroid nonpalpable and nontender Pulmonary-no tachypnea or accessory muscle use; Clear without wheezes via auscultation Card--no abnrml murmurs, rhythm reg and rate WNL Carotid pulses symmetric, without bruits       Assessment & Plan:  Impression 1 hyperlipidemia. Prior blood work reviewed. To maintain same dose. Time for new blood work discussed  #2 reflux clinically stable medication definitely helps meds refilled  #3 prediabetes status  uncertain to obtain blood work  #4 short-term M memory issues discussed at length. No evidence of early dementia. Offered MMSE. Patient to think about it next  Appropriate blood work. Diet exercise discussed. Further recommendations based results. Medications refilled.  Greater than 50% of this 25 minute face to face visit was spent in counseling and discussion and coordination of care regarding the above diagnosis/diagnosies

## 2016-11-11 LAB — LIPID PANEL
Chol/HDL Ratio: 2.5 ratio (ref 0.0–4.4)
Cholesterol, Total: 156 mg/dL (ref 100–199)
HDL: 62 mg/dL (ref >39)
LDL CALC: 84 mg/dL (ref 0–99)
TRIGLYCERIDES: 52 mg/dL (ref 0–149)
VLDL Cholesterol Cal: 10 mg/dL (ref 5–40)

## 2016-11-11 LAB — CBC WITH DIFFERENTIAL/PLATELET
BASOS: 0 %
Basophils Absolute: 0 10*3/uL (ref 0.0–0.2)
EOS (ABSOLUTE): 0.1 10*3/uL (ref 0.0–0.4)
EOS: 2 %
HEMOGLOBIN: 13.6 g/dL (ref 11.1–15.9)
Hematocrit: 41.4 % (ref 34.0–46.6)
Immature Grans (Abs): 0 10*3/uL (ref 0.0–0.1)
Immature Granulocytes: 0 %
LYMPHS ABS: 2.4 10*3/uL (ref 0.7–3.1)
Lymphs: 53 %
MCH: 28.7 pg (ref 26.6–33.0)
MCHC: 32.9 g/dL (ref 31.5–35.7)
MCV: 87 fL (ref 79–97)
MONOCYTES: 5 %
MONOS ABS: 0.2 10*3/uL (ref 0.1–0.9)
NEUTROS ABS: 1.8 10*3/uL (ref 1.4–7.0)
Neutrophils: 40 %
Platelets: 318 10*3/uL (ref 150–379)
RBC: 4.74 x10E6/uL (ref 3.77–5.28)
RDW: 14.5 % (ref 12.3–15.4)
WBC: 4.5 10*3/uL (ref 3.4–10.8)

## 2016-11-11 LAB — BASIC METABOLIC PANEL
BUN / CREAT RATIO: 11 — AB (ref 12–28)
BUN: 11 mg/dL (ref 8–27)
CHLORIDE: 104 mmol/L (ref 96–106)
CO2: 24 mmol/L (ref 20–29)
Calcium: 10 mg/dL (ref 8.7–10.3)
Creatinine, Ser: 0.99 mg/dL (ref 0.57–1.00)
GFR calc non Af Amer: 58 mL/min/{1.73_m2} — ABNORMAL LOW (ref >59)
GFR, EST AFRICAN AMERICAN: 67 mL/min/{1.73_m2} (ref >59)
GLUCOSE: 93 mg/dL (ref 65–99)
POTASSIUM: 5.1 mmol/L (ref 3.5–5.2)
SODIUM: 140 mmol/L (ref 134–144)

## 2016-11-11 LAB — HEPATIC FUNCTION PANEL
ALT: 18 IU/L (ref 0–32)
AST: 27 IU/L (ref 0–40)
Albumin: 4.2 g/dL (ref 3.5–4.8)
Alkaline Phosphatase: 58 IU/L (ref 39–117)
BILIRUBIN, DIRECT: 0.13 mg/dL (ref 0.00–0.40)
Bilirubin Total: 0.4 mg/dL (ref 0.0–1.2)
Total Protein: 6.8 g/dL (ref 6.0–8.5)

## 2016-11-11 LAB — HEMOGLOBIN A1C
Est. average glucose Bld gHb Est-mCnc: 117 mg/dL
HEMOGLOBIN A1C: 5.7 % — AB (ref 4.8–5.6)

## 2016-11-11 LAB — TSH: TSH: 1.12 u[IU]/mL (ref 0.450–4.500)

## 2016-11-14 ENCOUNTER — Encounter: Payer: Self-pay | Admitting: Family Medicine

## 2016-12-04 ENCOUNTER — Ambulatory Visit (HOSPITAL_COMMUNITY): Payer: Medicare Other

## 2016-12-04 ENCOUNTER — Encounter (HOSPITAL_COMMUNITY): Payer: Self-pay

## 2016-12-04 ENCOUNTER — Ambulatory Visit (HOSPITAL_COMMUNITY)
Admission: RE | Admit: 2016-12-04 | Discharge: 2016-12-04 | Disposition: A | Discharge status: Home / Self Care | Payer: Medicare Other | Source: Ambulatory Visit | Attending: Adult Health | Admitting: Adult Health

## 2016-12-04 DIAGNOSIS — Z1231 Encounter for screening mammogram for malignant neoplasm of breast: Secondary | ICD-10-CM

## 2016-12-15 ENCOUNTER — Encounter: Payer: Self-pay | Admitting: Adult Health

## 2016-12-15 ENCOUNTER — Ambulatory Visit (INDEPENDENT_AMBULATORY_CARE_PROVIDER_SITE_OTHER): Payer: Medicare Other | Admitting: Adult Health

## 2016-12-15 VITALS — BP 140/88 | HR 87 | Ht 70.0 in | Wt 188.0 lb

## 2016-12-15 DIAGNOSIS — Z1211 Encounter for screening for malignant neoplasm of colon: Secondary | ICD-10-CM | POA: Diagnosis not present

## 2016-12-15 DIAGNOSIS — Z1212 Encounter for screening for malignant neoplasm of rectum: Secondary | ICD-10-CM

## 2016-12-15 DIAGNOSIS — Z01419 Encounter for gynecological examination (general) (routine) without abnormal findings: Secondary | ICD-10-CM

## 2016-12-15 LAB — HEMOCCULT GUIAC POC 1CARD (OFFICE): FECAL OCCULT BLD: NEGATIVE

## 2016-12-15 NOTE — Progress Notes (Signed)
Patient ID: VERCIE POKORNY, female   DOB: 1945-12-14, 71 y.o.   MRN: [id number] History of Present Illness:  Oval is a 71 year old black female, sp hysterectomy in for a well woman gyn exam. PCP is Dr Mickie Hillier.  Current Medications, Allergies, Past Medical History, Past Surgical History, Family History and Social History were reviewed in [employer] record.     Review of Systems: Patient denies any headaches, hearing loss, fatigue, blurred vision, shortness of breath, chest pain, abdominal pain, problems with bowel movements, urination, or intercourse(not having sex). No joint pain or mood swings.    Physical Exam:BP 140/88 (BP Location: Left Arm, Patient Position: Sitting, Cuff Size: Normal)   Pulse 87   Ht 5\' 10"  (1.778 m)   Wt 188 lb (85.3 kg)   BMI 26.98 kg/m  General:  Well developed, well nourished, no acute distress Skin:  Warm and dry Neck:  Midline trachea, normal thyroid, good ROM, no lymphadenopathy, no carotid bruits heard Lungs; Clear to auscultation bilaterally Breast:  No dominant palpable mass, retraction, or nipple discharge Cardiovascular: Regular rate and rhythm Abdomen:  Soft, non tender, no hepatosplenomegaly Pelvic:  External genitalia is normal in appearance, no lesions.  The vagina is pale with loss of moisture and rugae.Marland Kitchen Urethra has no lesions or masses. The cervix and uterus are absent. No adnexal masses or tenderness noted.Bladder is non tender, no masses felt. Rectal: Good sphincter tone, no polyps, or hemorrhoids felt.  Hemoccult negative. Extremities/musculoskeletal:  No swelling or varicosities noted, no clubbing or cyanosis Psych:  No mood changes, alert and cooperative,seems happy PHQ 2 score 0.  Impression:  1. Well woman exam with routine gynecological exam   2. Screening for colorectal cancer      Plan: Physical in 1 year Mammogram yearly  Colonoscopy per GI Labs with PCP

## 2016-12-15 NOTE — Patient Instructions (Addendum)
Physical in 1 year Mammogram yearly Labs with PCP 

## 2017-01-27 IMAGING — CT CT ABD-PELV W/ CM
2 of 4 series · 12 of 32 positions shown, 18 images · IV contrast (APPLIED)
Comparison: 02/25/2013

CLINICAL DATA: Weight loss of 25 pounds over 6 years, 12 pounds of
which has been lost in past year, constipation, history
hyperlipidemia, GERD, hypertension

EXAM:
CT ABDOMEN AND PELVIS WITH CONTRAST
TECHNIQUE: Multidetector CT imaging of the abdomen and pelvis was performed
using the standard protocol following bolus administration of
intravenous contrast. Sagittal and coronal MPR images reconstructed
from axial data set.
BUN and creatinine were obtained on site at [HOSPITAL] at
[HOSPITAL].
Results:  BUN 19 mg/dL,  Creatinine 0.9 mg/dL.
CONTRAST:  100mL OMNIPAQUE IOHEXOL 300 MG/ML SOLN IV. Dilute oral
contrast.

[Series 2: abd pelvis 5.0 i41s 1 · axial · 0.78mm/px · z∈[+367,+707]mm · 10 of 84 slices shown, 16 images]
[im 8/84  soft-tissue]
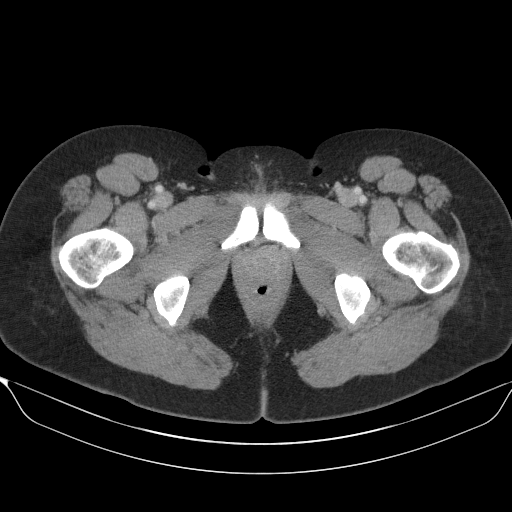
[im 8/84  bone]
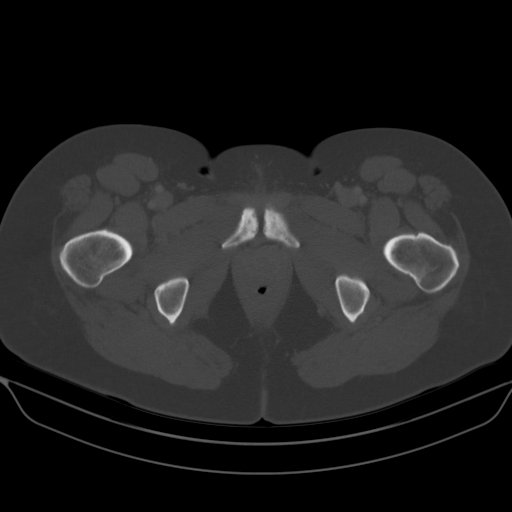
[im 16/84  soft-tissue]
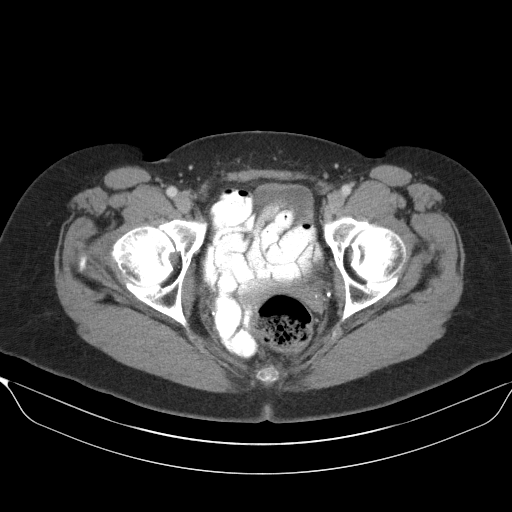
[im 23/84  soft-tissue]
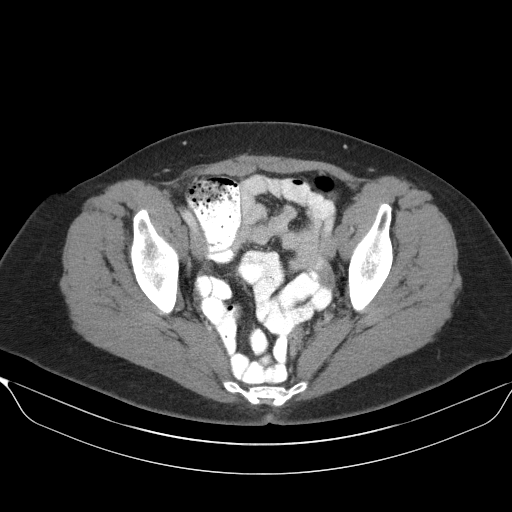
[im 31/84  soft-tissue]
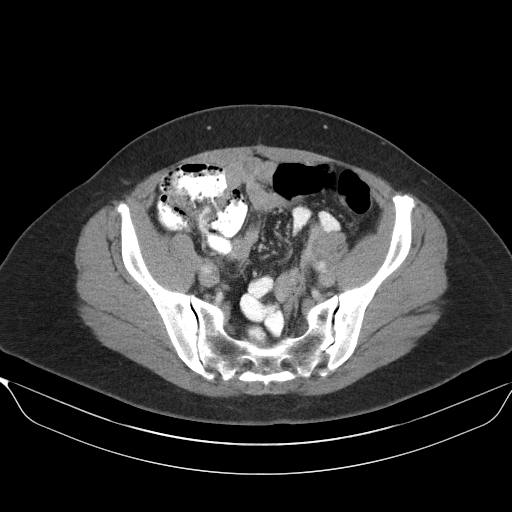
[im 38/84  soft-tissue]
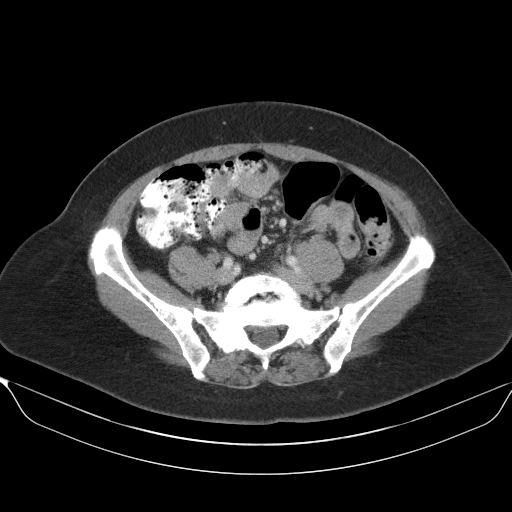
[im 46/84  soft-tissue]
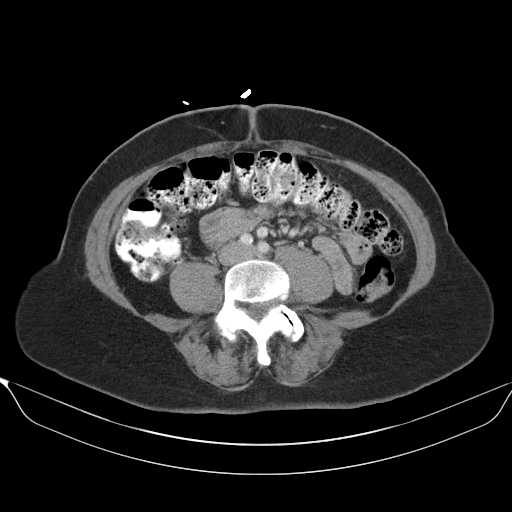
[im 53/84  soft-tissue]
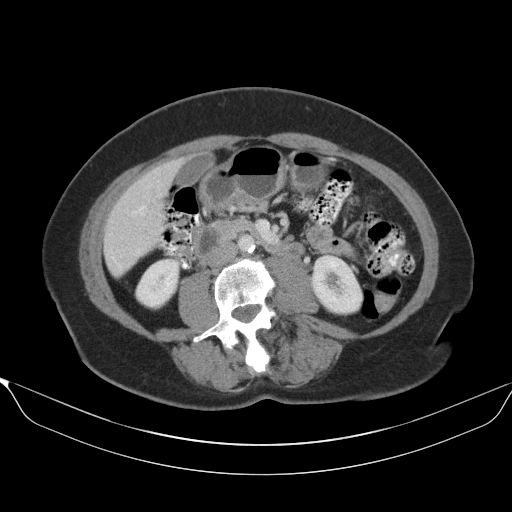
[im 53/84  lung]
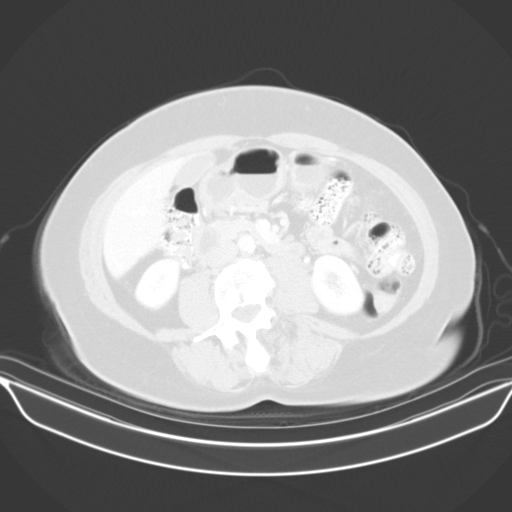
[im 61/84  soft-tissue]
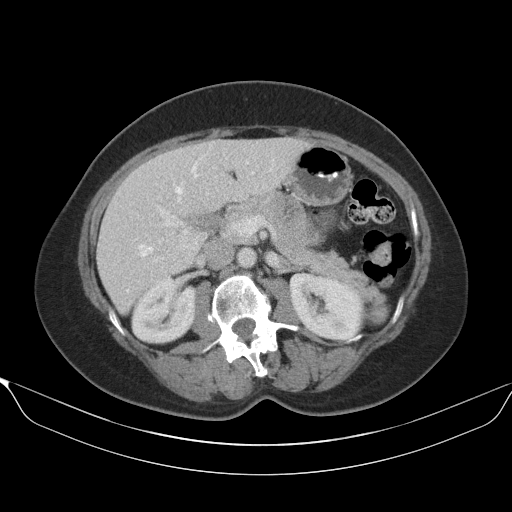
[im 61/84  lung]
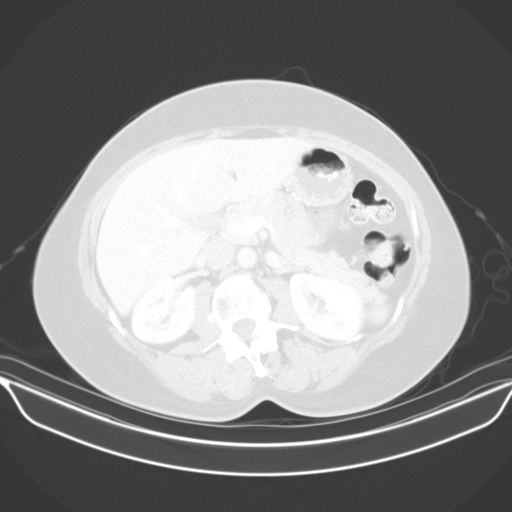
[im 68/84  soft-tissue]
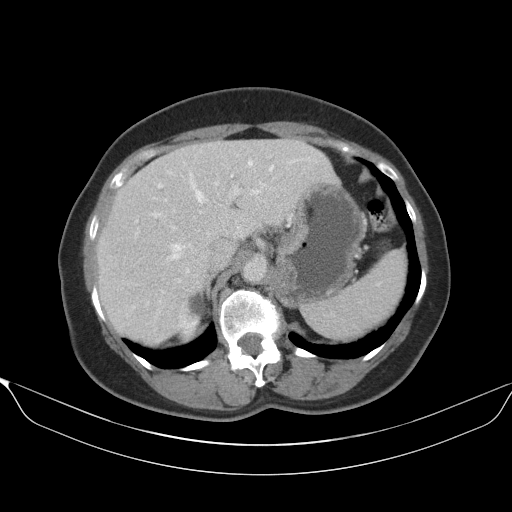
[im 68/84  lung]
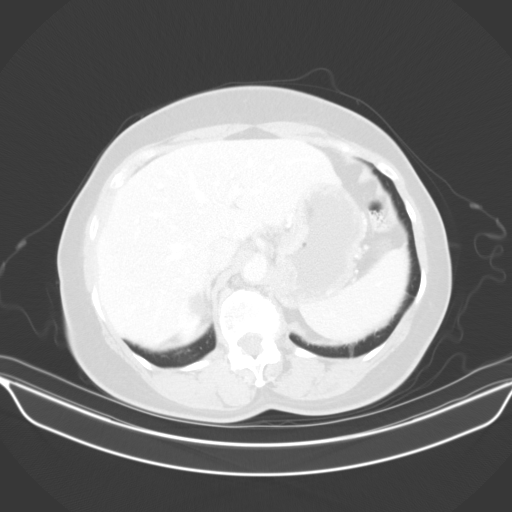
[im 68/84  bone]
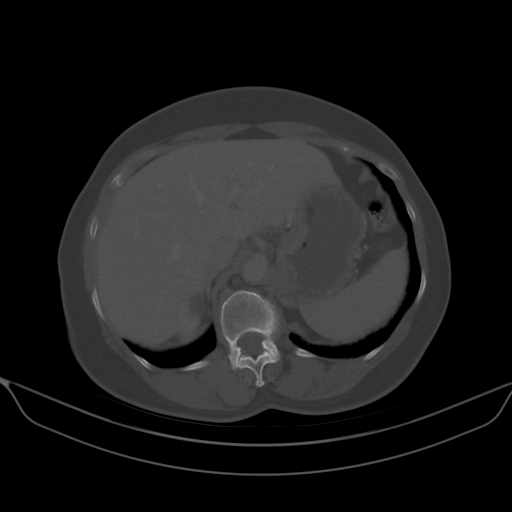
[im 76/84  soft-tissue]
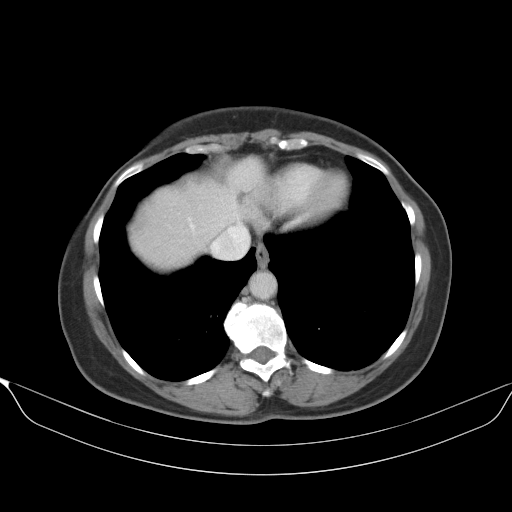
[im 76/84  lung]
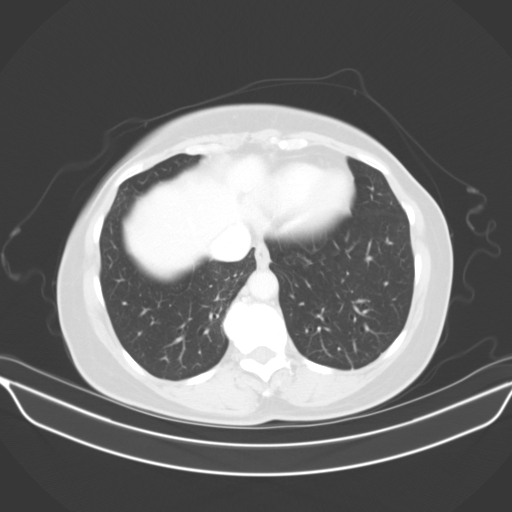

[Series 6: lung 5.0 i80s 3 · axial · 0.78mm/px · z∈[+672,+707]mm · 2 of 23 slices shown]
[im 8/23  bone]
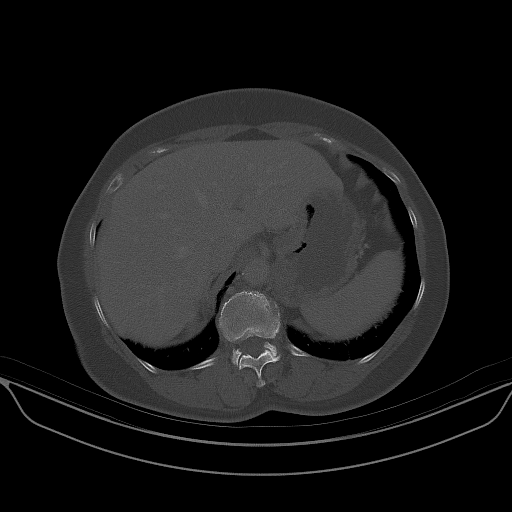
[im 15/23  bone]
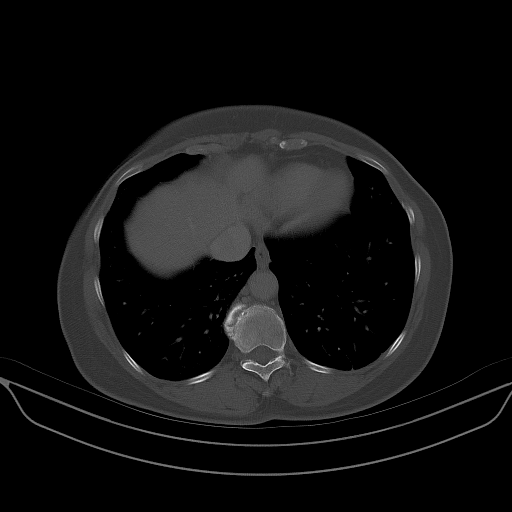

[12 of 32 positions shown; findings below may reference images not displayed]

FINDINGS: Lung bases clear.

RIGHT renal cysts, largest at pole 2.4 x 2.4 cm image 19, previously
2.1 x 2.1 cm.

Additional tiny nonspecific low-attenuation foci LEFT kidney.

Remainder of liver, spleen, pancreas, kidneys, and adrenal glands
normal.

Minimal atherosclerotic calcification.

Remainder of liver, spleen, pancreas, kidneys, and adrenal glands
normal appearance.

Normal appendix.

Uterus surgically absent with unremarkable ovaries.

Normal appearing bladder and ureters.

Stomach and bowel loops normal appearance.

No mass, adenopathy, free fluid, free air, or hernia.

Scattered degenerative disc and facet disease changes of the
thoracolumbar spine.
IMPRESSION: RIGHT renal cysts with additional tiny nonspecific foci LEFT kidney.

No definite acute intra-abdominal or intrapelvic abnormalities.

## 2017-02-15 ENCOUNTER — Other Ambulatory Visit: Payer: Self-pay

## 2017-02-15 ENCOUNTER — Telehealth: Payer: Self-pay | Admitting: Family Medicine

## 2017-02-15 MED ORDER — SIMVASTATIN 20 MG PO TABS: 20.0000 mg | ORAL_TABLET | Freq: Every day | ORAL | 1 refills | Status: DC

## 2017-02-15 NOTE — Telephone Encounter (Signed)
Patient is aware 

## 2017-02-15 NOTE — Telephone Encounter (Signed)
zocor 20 six mo worth

## 2017-02-15 NOTE — Telephone Encounter (Signed)
I called both home and cell number left a message on both to r/c.

## 2017-02-15 NOTE — Telephone Encounter (Signed)
Patient is currently taking Lipitor 20 mg but stopped taking it about a month ago stating it was causing muscles aches and spasms.Can you switch to different medication or would she need to be seen first.

## 2017-02-15 NOTE — Patient Outreach (Signed)
[facility]) Care Management  02/15/2017  Erin Wall 11-16-1945 [id number]   Medication Adherence call to [name] patient is showing past due under [facility].on Atorvastatin  20 mg spoke with patient she said she has stop taking this medication because of side effects (muscle aches), she has not call doctor Baltazar Apo to let then know that she is not taking this medication any more.call doctors office and left a message asking if doctor wants to prescribe something else and if they can call patient with any changes.   [place] Management Direct Dial [phone]  Fax [phone] [email or web address]

## 2017-04-23 ENCOUNTER — Ambulatory Visit: Payer: Medicare Other | Admitting: Family Medicine

## 2017-08-27 ENCOUNTER — Other Ambulatory Visit: Payer: Self-pay | Admitting: Pharmacist

## 2017-08-27 NOTE — Patient Outreach (Signed)
[facility]) Care Management  08/27/2017  MARCHELLE RINELLA Dec 15, 1945 [id number]   Incoming call from Milta Deiters in response to the EMMI Medication Adherence Campaign. Speak with patient. HIPAA identifiers verified and verbal consent received.  Ms. Giel reports that she is not currently taking simvastatin. Reports that she was having side effects to atorvastatin several months ago, but stopped taking it due to muscle aches. Note that per Epic chart review, 02/15/17 telephone note from Dr. Lance Sell office indicates that, for this reason, the provider switched Ms. Pittinger from atorvastatin to simvastatin 20 mg. Ms Woolen reports that she may have filled the simvastatin earlier in the year, but that she had forgotten about it at this time. Reports that she will start back taking it. Offer to call [place] on behalf of the patient to request this refill. Patient expresses appreciation for me doing this.   Ms. Hottenstein denies any further medication questions/concerns at this time. Note that patient does not currently have a future office visit scheduled with her PCP and was recommended to return for follow up in March. Ms. Runnels states that she will call to schedule an appointment  PLAN  1) I call patient's [place] and request the refill of the patient's simvastatin.  2) Ms. Boffa to call her PCP office to schedule an appointment.  3) Will now close pharmacy episode.  Harlow Asa, PharmD, [place] [facility] [phone]

## 2017-09-04 ENCOUNTER — Ambulatory Visit: Payer: Medicare Other | Admitting: Family Medicine

## 2017-09-05 ENCOUNTER — Encounter: Payer: Self-pay | Admitting: Family Medicine

## 2017-09-05 ENCOUNTER — Ambulatory Visit: Payer: Medicare Other | Admitting: Family Medicine

## 2017-09-05 VITALS — BP 128/82 | Ht 70.0 in | Wt 179.4 lb

## 2017-09-05 DIAGNOSIS — Z1329 Encounter for screening for other suspected endocrine disorder: Secondary | ICD-10-CM

## 2017-09-05 DIAGNOSIS — R413 Other amnesia: Secondary | ICD-10-CM

## 2017-09-05 DIAGNOSIS — R7303 Prediabetes: Secondary | ICD-10-CM

## 2017-09-05 DIAGNOSIS — R5383 Other fatigue: Secondary | ICD-10-CM | POA: Diagnosis not present

## 2017-09-05 DIAGNOSIS — I10 Essential (primary) hypertension: Secondary | ICD-10-CM

## 2017-09-05 DIAGNOSIS — E785 Hyperlipidemia, unspecified: Secondary | ICD-10-CM

## 2017-09-05 MED ORDER — SIMVASTATIN 20 MG PO TABS: 20.0000 mg | ORAL_TABLET | Freq: Every day | ORAL | 1 refills | Status: DC

## 2017-09-05 NOTE — Progress Notes (Signed)
   Subjective:    Patient ID: Erin Wall, female    DOB: Sep 30, 1945, 72 y.o.   MRN: [id number]  HPI Pt here to discuss weight change. Pt states she is also more tired than normal. Pt would like to know if the weight loss is related to age.   Exercise has slowed down  Watching diet fairly closely, has gotten away from cakes and starches   Takes chol med faithfully   Last clolonsocoy was done summer of 2016   Patient continues to take lipid medication regularly. No obvious side effects from it. Generally does not miss a dose. Prior blood work results are reviewed with patient. Patient continues to work on fat intake in diet  Patient still continues to express concern about weight loss.  This occurred soon after she started tightening up her diet after diagnosis of prediabetes.  Patient is about 10 pounds lighter now than she was several years ago.  Patient has known prediabetes.  She has tried to cut down her sugar intake.  Exercising a fair amount.  Although not as much she used to be  Patient notes general fatigue.  Not really sure why.              Review of Systems No headache, no major weight loss or weight gain, no chest pain no back pain abdominal pain no change in bowel habits complete ROS otherwise negative     Objective:   Physical Exam   Alert and oriented, vitals reviewed and stable, NAD ENT-TM's and ext canals WNL bilat via otoscopic exam Soft palate, tonsils and post pharynx WNL via oropharyngeal exam Neck-symmetric, no masses; thyroid nonpalpable and nontender Pulmonary-no tachypnea or accessory muscle use; Clear without wheezes via auscultation Card--no abnrml murmurs, rhythm reg and rate WNL Carotid pulses symmetric, without bruits      Assessment & Plan:  Impression weight loss.  Discussed at length.  Likely the patient's improved diet  2.  Prediabetes.  Exact status uncertain.  Will check blood work discussed  3.  Fatigue.   Nonspecific.  We will check some blood results in this regard  4.  Hyperlipidemia.  Status uncertain.  Appropriate blood work.  Further recommendations based on results.  Prior blood work reviewed.  Greater than 50% of this 25 minute face to face visit was spent in counseling and discussion and coordination of care regarding the above diagnosis/diagnosies

## 2017-09-06 LAB — CBC WITH DIFFERENTIAL/PLATELET
Basophils Absolute: 0 10*3/uL (ref 0.0–0.2)
Basos: 0 %
EOS (ABSOLUTE): 0.1 10*3/uL (ref 0.0–0.4)
Eos: 2 %
Hematocrit: 44.8 % (ref 34.0–46.6)
Hemoglobin: 14 g/dL (ref 11.1–15.9)
Immature Grans (Abs): 0 10*3/uL (ref 0.0–0.1)
Immature Granulocytes: 0 %
Lymphocytes Absolute: 2 10*3/uL (ref 0.7–3.1)
Lymphs: 45 %
MCH: 27.7 pg (ref 26.6–33.0)
MCHC: 31.3 g/dL — AB (ref 31.5–35.7)
MCV: 89 fL (ref 79–97)
MONOS ABS: 0.2 10*3/uL (ref 0.1–0.9)
Monocytes: 5 %
Neutrophils Absolute: 2.2 10*3/uL (ref 1.4–7.0)
Neutrophils: 48 %
PLATELETS: 338 10*3/uL (ref 150–450)
RBC: 5.05 x10E6/uL (ref 3.77–5.28)
RDW: 14.1 % (ref 12.3–15.4)
WBC: 4.6 10*3/uL (ref 3.4–10.8)

## 2017-09-06 LAB — LIPID PANEL
CHOLESTEROL TOTAL: 207 mg/dL — AB (ref 100–199)
Chol/HDL Ratio: 3.2 ratio (ref 0.0–4.4)
HDL: 64 mg/dL (ref >39)
LDL CALC: 127 mg/dL — AB (ref 0–99)
TRIGLYCERIDES: 78 mg/dL (ref 0–149)
VLDL Cholesterol Cal: 16 mg/dL (ref 5–40)

## 2017-09-06 LAB — BASIC METABOLIC PANEL
BUN/Creatinine Ratio: 9 — ABNORMAL LOW (ref 12–28)
BUN: 9 mg/dL (ref 8–27)
CHLORIDE: 104 mmol/L (ref 96–106)
CO2: 22 mmol/L (ref 20–29)
Calcium: 10.3 mg/dL (ref 8.7–10.3)
Creatinine, Ser: 0.98 mg/dL (ref 0.57–1.00)
GFR calc Af Amer: 67 mL/min/{1.73_m2} (ref >59)
GFR calc non Af Amer: 58 mL/min/{1.73_m2} — ABNORMAL LOW (ref >59)
Glucose: 93 mg/dL (ref 65–99)
POTASSIUM: 5.4 mmol/L — AB (ref 3.5–5.2)
Sodium: 144 mmol/L (ref 134–144)

## 2017-09-06 LAB — HEPATIC FUNCTION PANEL
ALT: 21 IU/L (ref 0–32)
AST: 20 IU/L (ref 0–40)
Albumin: 4.5 g/dL (ref 3.5–4.8)
Alkaline Phosphatase: 59 IU/L (ref 39–117)
BILIRUBIN TOTAL: 0.4 mg/dL (ref 0.0–1.2)
Bilirubin, Direct: 0.13 mg/dL (ref 0.00–0.40)
Total Protein: 7.1 g/dL (ref 6.0–8.5)

## 2017-09-06 LAB — TSH: TSH: 0.591 u[IU]/mL (ref 0.450–4.500)

## 2017-09-07 ENCOUNTER — Encounter: Payer: Self-pay | Admitting: Family Medicine

## 2017-11-01 ENCOUNTER — Other Ambulatory Visit: Payer: Self-pay | Admitting: Adult Health

## 2017-11-01 DIAGNOSIS — Z1231 Encounter for screening mammogram for malignant neoplasm of breast: Secondary | ICD-10-CM

## 2017-11-02 ENCOUNTER — Other Ambulatory Visit: Payer: Self-pay | Admitting: Family Medicine

## 2017-11-02 DIAGNOSIS — Z1231 Encounter for screening mammogram for malignant neoplasm of breast: Secondary | ICD-10-CM

## 2017-12-07 ENCOUNTER — Ambulatory Visit (HOSPITAL_COMMUNITY): Payer: Medicare Other

## 2017-12-13 ENCOUNTER — Ambulatory Visit (HOSPITAL_COMMUNITY)
Admission: RE | Admit: 2017-12-13 | Discharge: 2017-12-13 | Disposition: A | Discharge status: Home / Self Care | Payer: Medicare Other | Source: Ambulatory Visit | Attending: Family Medicine | Admitting: Family Medicine

## 2017-12-13 DIAGNOSIS — Z1231 Encounter for screening mammogram for malignant neoplasm of breast: Secondary | ICD-10-CM | POA: Insufficient documentation

## 2017-12-17 ENCOUNTER — Ambulatory Visit: Payer: Medicare Other | Admitting: Adult Health

## 2017-12-17 ENCOUNTER — Encounter: Payer: Self-pay | Admitting: Adult Health

## 2017-12-17 VITALS — BP 154/81 | HR 77 | Ht 69.25 in | Wt 177.5 lb

## 2017-12-17 DIAGNOSIS — Z1212 Encounter for screening for malignant neoplasm of rectum: Secondary | ICD-10-CM | POA: Diagnosis not present

## 2017-12-17 DIAGNOSIS — Z01419 Encounter for gynecological examination (general) (routine) without abnormal findings: Secondary | ICD-10-CM | POA: Diagnosis not present

## 2017-12-17 DIAGNOSIS — Z1211 Encounter for screening for malignant neoplasm of colon: Secondary | ICD-10-CM | POA: Diagnosis not present

## 2017-12-17 DIAGNOSIS — R319 Hematuria, unspecified: Secondary | ICD-10-CM

## 2017-12-17 DIAGNOSIS — R350 Frequency of micturition: Secondary | ICD-10-CM | POA: Diagnosis not present

## 2017-12-17 LAB — HEMOCCULT GUIAC POC 1CARD (OFFICE): FECAL OCCULT BLD: NEGATIVE

## 2017-12-17 LAB — POCT URINALYSIS DIPSTICK: Blood, UA: POSITIVE

## 2017-12-17 NOTE — Progress Notes (Signed)
Patient ID: Erin Wall, female   DOB: [birth year], 72 y.o.   MRN: [id number] History of Present Illness: Erin Wall is a 72 year old black female, single, sp hysterectomy,retired, lives alone. PCP is Dr Erin Wall.    Current Medications, Allergies, Past Medical History, Past Surgical History, Family History and Social History were reviewed in [employer] record.     Review of Systems:  Patient denies any headaches, hearing loss, fatigue, blurred vision, shortness of breath, chest pain, abdominal pain, problems with bowel movements,  or intercourse(not having sex). No joint pain or mood swings. +urainry frequency.   Physical Exam:BP (!) 154/81 (BP Location: Left Arm, Patient Position: Sitting, Cuff Size: Normal)   Pulse 77   Ht 5' 9.25" (1.759 m)   Wt 177 lb 8 oz (80.5 kg)   BMI 26.02 kg/m Urine +blood.  General:  Well developed, well nourished, no acute distress Skin:  Warm and dry Neck:  Midline trachea, normal thyroid, good ROM, no lymphadenopathy,no carotid bruits heard Lungs; Clear to auscultation bilaterally Breast:  No dominant palpable mass, retraction, or nipple discharge Cardiovascular: Regular rate and rhythm Abdomen:  Soft, non tender, no hepatosplenomegaly Pelvic:  External genitalia is normal in appearance, no lesions.  The vagina is normal in appearance for age with loss of color, moisture and rugae. Urethra has no lesions or masses. The cervix and uterus are absent. No adnexal masses or tenderness noted.Bladder is non tender, no masses felt. Rectal: Good sphincter tone, no polyps, or hemorrhoids felt.  Hemoccult negative. Extremities/musculoskeletal:  No swelling or varicosities noted, no clubbing or cyanosis Psych:  No mood changes, alert and cooperative,seems happy PHQ 2 score 0. Examination chaperoned by Levy Pupa LPN.    Impression:  1. Well woman exam with routine gynecological exam   2. Screening for colorectal cancer   3. Urinary  frequency   4. Hematuria, unspecified type      Plan: UA C&S sent Physical in 1 year Mammogram yearly Lab with PCP Colonoscopy per GI

## 2017-12-18 LAB — MICROSCOPIC EXAMINATION: Casts: NONE SEEN /lpf

## 2017-12-18 LAB — URINALYSIS, ROUTINE W REFLEX MICROSCOPIC
BILIRUBIN UA: NEGATIVE
Glucose, UA: NEGATIVE
Nitrite, UA: NEGATIVE
Protein, UA: NEGATIVE
SPEC GRAV UA: 1.023 (ref 1.005–1.030)
UUROB: 0.2 mg/dL (ref 0.2–1.0)
pH, UA: 5 (ref 5.0–7.5)

## 2018-05-02 ENCOUNTER — Telehealth: Payer: Self-pay | Admitting: Adult Health

## 2018-05-02 NOTE — Telephone Encounter (Signed)
Left message I returned her call  

## 2018-05-02 NOTE — Telephone Encounter (Signed)
Patient called, would like an order for a mammogram, would like to have it done at [facility].  [phone]

## 2018-08-22 ENCOUNTER — Telehealth: Payer: Self-pay | Admitting: Family Medicine

## 2018-08-22 ENCOUNTER — Other Ambulatory Visit: Payer: Self-pay

## 2018-08-22 NOTE — Telephone Encounter (Signed)
There is no easy answer here. We are not legally allowed to discuss her condition with them but she as her sister needs to prevail upon her to come in and see [place]. If they will or can not do that, We will can call the patiernt and recommend a face to face visit next wk. Then we can administer an MMSE to ck for signs of dementia

## 2018-08-22 NOTE — Telephone Encounter (Signed)
Left message to return call (Patient ) to schedule an office visit with Dr Richardson Landry- Patient last seen 08/2017

## 2018-08-22 NOTE — Telephone Encounter (Signed)
Pt's sister is wanting to come in and have an appt with Dr. Richardson Landry to discuss Mrs. Erin Wall's health. They are worried she has early signs of dementia she will call her in the afternoons saying people are in her house and no one is there. This morning she went to the dentist and tried to walk out the back door and the dentist office called Erin Wall in regards to noticing a drastic change in her health and being very confused. They have tried to get her to come in to the office for a visit but she doesn't think she needs a check up or anything is wrong as long as she can get from point a to b but she is getting lost going to certain doc offices.   We do not have a DPR or anything on file giving [place] permission to talk to anyone in regards to patient.   Pt last seen in office here 08/2017

## 2018-08-22 NOTE — Telephone Encounter (Signed)
Patient scheduled office visit with dr Richardson Landry 08/30/18 for med check

## 2018-08-22 NOTE — Telephone Encounter (Signed)
Lets be sure to make a note to do mmse when arrives

## 2018-08-22 NOTE — Telephone Encounter (Signed)
Added to appt note.

## 2018-08-30 ENCOUNTER — Ambulatory Visit: Payer: Medicare Other | Admitting: Family Medicine

## 2018-09-05 ENCOUNTER — Ambulatory Visit: Payer: Medicare Other | Admitting: Family Medicine

## 2018-09-20 ENCOUNTER — Ambulatory Visit (INDEPENDENT_AMBULATORY_CARE_PROVIDER_SITE_OTHER): Payer: Medicare Other | Admitting: Family Medicine

## 2018-09-20 ENCOUNTER — Encounter: Payer: Self-pay | Admitting: Family Medicine

## 2018-09-20 ENCOUNTER — Other Ambulatory Visit: Payer: Self-pay

## 2018-09-20 VITALS — BP 124/80 | Temp 95.5°F | Wt 170.0 lb

## 2018-09-20 DIAGNOSIS — Z1329 Encounter for screening for other suspected endocrine disorder: Secondary | ICD-10-CM

## 2018-09-20 DIAGNOSIS — R930 Abnormal findings on diagnostic imaging of skull and head, not elsewhere classified: Secondary | ICD-10-CM

## 2018-09-20 DIAGNOSIS — E785 Hyperlipidemia, unspecified: Secondary | ICD-10-CM

## 2018-09-20 DIAGNOSIS — R5383 Other fatigue: Secondary | ICD-10-CM

## 2018-09-20 DIAGNOSIS — E538 Deficiency of other specified B group vitamins: Secondary | ICD-10-CM

## 2018-09-20 DIAGNOSIS — R413 Other amnesia: Secondary | ICD-10-CM

## 2018-09-20 DIAGNOSIS — I10 Essential (primary) hypertension: Secondary | ICD-10-CM

## 2018-09-20 NOTE — Progress Notes (Signed)
   Subjective:    Patient ID: Erin Wall, female    DOB: 1945/05/28, 73 y.o.   MRN: [id number]  HPI Pt here today for memory issues. Pt states she does have occasional forgetfulness. Pt states she has not ever got into a car and became lost.  Patient's family member had called extremely worried about increased forgetfulness.  The family remember reported an episode at the dentist office where she appeared to become temporarily disoriented and not sure which door to go out.  Family has also noticed increased forgetfulness  Patient states that memory issues have become more of a concern.  However she feels this is not a major problem  Positive family history of dementia  Patient takes an MMSE test today.  Actually scored 29 out of 30.  Review of Systems No headache, no major weight loss or weight gain, no chest pain no back pain abdominal pain no change in bowel habits complete ROS otherwise negative     Objective:   Physical Exam  Alert and oriented, vitals reviewed and stable, NAD ENT-TM's and ext canals WNL bilat via otoscopic exam Soft palate, tonsils and post pharynx WNL via oropharyngeal exam Neck-symmetric, no masses; thyroid nonpalpable and nontender Pulmonary-no tachypnea or accessory muscle use; Clear without wheezes via auscultation Card--no abnrml murmurs, rhythm reg and rate WNL Carotid pulses symmetric, without bruits       Assessment & Plan:  Impression progressive memory issues with strong family history of dementia and high concern on the part of the family.  We will do some baseline blood work and a CT scan.  Further recommendations based on results

## 2018-09-24 LAB — LIPID PANEL
Chol/HDL Ratio: 3.6 ratio (ref 0.0–4.4)
Cholesterol, Total: 249 mg/dL — ABNORMAL HIGH (ref 100–199)
HDL: 69 mg/dL (ref >39)
LDL Calculated: 165 mg/dL — ABNORMAL HIGH (ref 0–99)
Triglycerides: 77 mg/dL (ref 0–149)
VLDL Cholesterol Cal: 15 mg/dL (ref 5–40)

## 2018-09-24 LAB — VITAMIN B12: Vitamin B-12: 827 pg/mL (ref 232–1245)

## 2018-09-24 LAB — HEPATIC FUNCTION PANEL
ALT: 16 IU/L (ref 0–32)
AST: 21 IU/L (ref 0–40)
Albumin: 4.7 g/dL (ref 3.7–4.7)
Alkaline Phosphatase: 63 IU/L (ref 39–117)
Bilirubin Total: 0.4 mg/dL (ref 0.0–1.2)
Bilirubin, Direct: 0.13 mg/dL (ref 0.00–0.40)
Total Protein: 7.4 g/dL (ref 6.0–8.5)

## 2018-09-24 LAB — BASIC METABOLIC PANEL
BUN/Creatinine Ratio: 13 (ref 12–28)
BUN: 14 mg/dL (ref 8–27)
CO2: 18 mmol/L — ABNORMAL LOW (ref 20–29)
Calcium: 10.6 mg/dL — ABNORMAL HIGH (ref 8.7–10.3)
Chloride: 106 mmol/L (ref 96–106)
Creatinine, Ser: 1.11 mg/dL — ABNORMAL HIGH (ref 0.57–1.00)
GFR calc Af Amer: 57 mL/min/{1.73_m2} — ABNORMAL LOW (ref >59)
GFR calc non Af Amer: 50 mL/min/{1.73_m2} — ABNORMAL LOW (ref >59)
Glucose: 91 mg/dL (ref 65–99)
Potassium: 4.3 mmol/L (ref 3.5–5.2)
Sodium: 145 mmol/L — ABNORMAL HIGH (ref 134–144)

## 2018-09-24 LAB — FOLATE: Folate: 12 ng/mL (ref >3.0)

## 2018-09-24 LAB — TSH: TSH: 0.661 u[IU]/mL (ref 0.450–4.500)

## 2018-10-07 ENCOUNTER — Ambulatory Visit (HOSPITAL_COMMUNITY)
Admission: RE | Admit: 2018-10-07 | Discharge: 2018-10-07 | Disposition: A | Discharge status: Home / Self Care | Payer: Medicare Other | Source: Ambulatory Visit | Attending: Family Medicine | Admitting: Family Medicine

## 2018-10-07 ENCOUNTER — Other Ambulatory Visit: Payer: Self-pay

## 2018-10-07 DIAGNOSIS — R413 Other amnesia: Secondary | ICD-10-CM | POA: Diagnosis not present

## 2018-10-10 NOTE — Addendum Note (Signed)
Addended by: Dairl Ponder on: 10/10/2018 04:27 PM   Modules accepted: Orders

## 2018-10-14 ENCOUNTER — Encounter: Payer: Self-pay | Admitting: Neurology

## 2018-10-14 ENCOUNTER — Encounter: Payer: Self-pay | Admitting: Family Medicine

## 2018-11-15 ENCOUNTER — Other Ambulatory Visit (HOSPITAL_COMMUNITY): Payer: Self-pay | Admitting: Obstetrics and Gynecology

## 2018-11-15 ENCOUNTER — Ambulatory Visit (HOSPITAL_COMMUNITY)
Admission: RE | Admit: 2018-11-15 | Discharge: 2018-11-15 | Disposition: A | Discharge status: Home / Self Care | Payer: Medicare Other | Source: Ambulatory Visit | Attending: Family Medicine | Admitting: Family Medicine

## 2018-11-15 ENCOUNTER — Other Ambulatory Visit: Payer: Self-pay

## 2018-11-15 ENCOUNTER — Ambulatory Visit (INDEPENDENT_AMBULATORY_CARE_PROVIDER_SITE_OTHER): Payer: Medicare Other | Admitting: Family Medicine

## 2018-11-15 VITALS — BP 130/84 | Temp 98.1°F | Wt 173.2 lb

## 2018-11-15 DIAGNOSIS — M25511 Pain in right shoulder: Secondary | ICD-10-CM | POA: Insufficient documentation

## 2018-11-15 DIAGNOSIS — Z23 Encounter for immunization: Secondary | ICD-10-CM

## 2018-11-15 DIAGNOSIS — M25561 Pain in right knee: Secondary | ICD-10-CM | POA: Insufficient documentation

## 2018-11-15 DIAGNOSIS — Z1231 Encounter for screening mammogram for malignant neoplasm of breast: Secondary | ICD-10-CM

## 2018-11-15 NOTE — Progress Notes (Signed)
   Subjective:    Patient ID: Erin Wall, female    DOB: May 13, 1945, 73 y.o.   MRN: [id number]  HPI  Patient arrives with right knee pain s/p fall. Patient states she fell about a month ago and her right knee is still bothering her.  Patient suffered a fall several weeks ago.  Still experiencing right knee pain and right shoulder pain.  Notes swelling on the right knee  3 years nasal sores headaches at times but no major discussed  Patient fell struck her knee on the front part of the knee.  Has had intermittent swelling and pain since although overall improved  Patient also notes persistent shoulder pain.  Review of Systems No headache, no major weight loss or weight gain, no chest pain no back pain abdominal pain no change in bowel habits complete ROS otherwise negative     Objective:   Physical Exam  Alert vitals stable, NAD. Blood pressure good on repeat. HEENT normal. Lungs clear. Heart regular rate and rhythm. Right shoulder positive impingement sign right knee effusion evident some crepitations with extension     Assessment & Plan:  Impression status post shoulder and knee injury.  We will do x-rays to look for acute injury.  Would not be surprised by presence of osteoarthritis.  Likely flared up due to fall addendum x-rays no acute findings patient to use anti-inflammatory medicine plus time to see if this fades on its own

## 2018-12-02 ENCOUNTER — Telehealth: Payer: Self-pay | Admitting: Family Medicine

## 2018-12-02 NOTE — Telephone Encounter (Signed)
I would encourage family to ask neurologist when they see them their opinion on this. They can not physically restrain the pt fromn trying to renew. But they should tell her about their opinions.

## 2018-12-02 NOTE — Telephone Encounter (Signed)
Sister is concerned about [place] getting her license renewed.  Her license expires this week and she would like to prevent her from getting them but isn't sure how.  She said her mental state has gotten worse and says she shouldn't drive.  They are scheduled with the neurologist this week.  Needs advice on what to do.

## 2018-12-02 NOTE — Telephone Encounter (Signed)
Discussed with pt's sister sheila and she verbalized understanding.

## 2018-12-04 ENCOUNTER — Other Ambulatory Visit: Payer: Self-pay

## 2018-12-04 ENCOUNTER — Ambulatory Visit: Payer: Medicare Other | Admitting: Neurology

## 2018-12-04 ENCOUNTER — Encounter: Payer: Self-pay | Admitting: Neurology

## 2018-12-04 VITALS — BP 130/70 | HR 71 | Temp 97.0°F | Ht 71.5 in | Wt 169.0 lb

## 2018-12-04 DIAGNOSIS — F0390 Unspecified dementia without behavioral disturbance: Secondary | ICD-10-CM | POA: Diagnosis not present

## 2018-12-04 DIAGNOSIS — F03A0 Unspecified dementia, mild, without behavioral disturbance, psychotic disturbance, mood disturbance, and anxiety: Secondary | ICD-10-CM

## 2018-12-04 MED ORDER — DONEPEZIL HCL 10 MG PO TABS: ORAL_TABLET | ORAL | 11 refills | Status: DC

## 2018-12-04 NOTE — Patient Instructions (Addendum)
1. Schedule MRI brain without contrast  2. Recommend starting medication called Donepezil to help slow down worsening of memory. Start Donepezil 10mg : take 1/2 tablet daily for 2 weeks, then increase to 1 tablet daily  3. Monitor driving and home safety regularly  4. It is important to appoint a Power of Daniel  5. Follow-up in 6 months, call for any changes   What is dementia?-Dementia is the general term for a group of brain disorders that cause memory problems and make it hard to think clearly (figure 1).  What symptoms does dementia cause?-The symptoms of dementia often start off very mild and get worse slowly.   Symptoms can include: ?Forgetting all sorts of things ?Confusion ?Trouble with language (for example, not being able to find the right words for things) ?Trouble concentrating and reasoning ?Problems with tasks such as paying bills or balancing a checkbook ?Getting lost in familiar places As dementia gets worse, people might: ?Have episodes of anger or aggression ?See things that aren't there or believe things that aren't true ?Be unable to eat, bathe, dress, or do other everyday tasks ?Lose bladder and bowel control What are the different kinds of dementia?-The most common kinds include:  ?Alzheimer disease - Alzheimer disease is the most common cause of dementia. It is a disorder in which brain cells slowly die over time.  ?Vascular dementia - Vascular dementia happens when parts of the brain do not get enough blood. This can happen when blood vessels in the brain get blocked with blood clots or damaged by high blood pressure or aging. This form of dementia is most common among people who have had strokes or who are at risk for strokes.  ?Parkinson disease dementia - Parkinson disease is a brain disorder that affects movement. It causes trembling, stiffness, and slowness. As Parkinson disease gets worse, some people develop dementia.  ?Other causes of  dementia - Dementia can also happen if a person's brain has been damaged. For example, having many head injuries can lead to dementia.  Should I see a doctor or nurse?-Yes, you should see a doctor or nurse if you think you or someone close to you is showing signs of dementia. Sometimes memory loss and confusion are caused by medical problems other than dementia that can be treated. For example, people with diabetes sometimes show signs of confusion when their blood sugar is not well controlled.  Are there tests I should have?-Your doctor or nurse will decide which tests you should have based on your individual situation. Many people with signs of dementia do not need a brain scan. That's because the tests that are most useful are the ones that look at how you answer questions and do certain tasks. Even so, your doctor might want to do a brain scan (either CT or MRI) to make sure that your symptoms are not caused by a problem unrelated to dementia.  How is dementia treated?-That depends on what kind of dementia you have. If you have Alzheimer disease, there are medicines that might help some. If you have vascular dementia, your doctor will focus on keeping your blood pressure and cholesterol as close to normal as possible. Doing that can help reduce further damage to the brain.  Sadly, there really aren't good treatments for most types of dementia. But doctors can sometimes treat troubling symptoms that come with dementia, such as depression or anxiety.  How do I stay safe?-If you have dementia, you might not be aware of how much  your condition affects you. Trust your family and friends to tell you when it is no longer safe for you to drive, cook, or do other things that could be dangerous.  Be aware, too, that people with dementia often fall and hurt themselves. To reduce the risk of falls, it's a good idea to: ?Secure loose rugs or use non-skid backing on rugs ?Tuck away loose wires or  electrical cords ?Wear sturdy, comfortable shoes ?Keep walkways well lit Can dementia be prevented?-There are no proven ways to prevent dementia. But here are some things that seem to help keep the brain healthy: ?Physical activity ?A healthy diet ?Social interaction   A referral to [facility] has been placed for your MRI someone will contact you directly to schedule your appt. They are located at [place]. Please contact them directly by calling [phone] with any questions regarding your referral.

## 2018-12-04 NOTE — Progress Notes (Signed)
NEUROLOGY CONSULTATION NOTE  Erin Wall MRN: [id number] DOB: [birth year]  Referring provider: Dr. Baltazar Apo Primary care provider: Dr. Baltazar Apo  Reason for consult:  Memory loss  Dear Dr Wolfgang Phoenix:  Thank you for your kind referral of Erin Wall for consultation of the above symptoms. Although her history is well known to you, please allow me to reiterate it for the purpose of our medical record. The patient was accompanied to the clinic by her sister who also provides collateral information. Records and images were personally reviewed where available.   HISTORY OF PRESENT ILLNESS: This is a 73 year old right-handed woman with a history of diet-controlled hypertension, hyperlipidemia, presenting for evaluation of memory loss. Her sister [name] is present during the visit to provide additional information. The patient reports that her memory is not bad, but not as sharp. She lives alone. She denies getting lost driving. She reports taking medications regularly. She reports missing a bill payment in Feb/March. She forgets names but not very often. She occasionally misplaces things. Her sister provides a different history. [name] reports the patient started having memory changes a year ago. She is more forgetful, worse in the evening hours. She has lost her keys several times. In Feb/March, utilities were cut off, including her electricity, cable, and cell phone, she had bills up to $300-400, family helped her fix this and put everything on draft. She was getting lost driving [name] disclosed this privately). [name] reports she gets very confused when she wakes up from dreams. She states she is having dreams. She has sundowning, getting confused and having difficulty understanding what people are telling her. She knows what she wants to say but gets tangled up getting them out. Over the past 1-2 months, she started having hallucinations, telling her sister that there are children  in the house or someone had come or their brother who lives out of town is there. She loses things and says somebody got her shoes and found it in her car. She states there was a bazaar that occurred. She is independent with dressing and bathing, no hygiene concerns. Her father had dementia in his 71s. She denies any history of significant head injuries or alcohol use.  She has urinary frequency. She denies any headaches, dizziness, diplopia, dysarthria, dysphagia, neck/back pain, focal numbness/tingling/weakness, bowel dysfunction. No anosmia, tremors, no falls. Sleep is good. She fell a few months ago on her deck and injured her knee. She states her mood is great. She is a retired Pharmacist, [employer].  I personally reviewed head CT without contrast done 09/2018 which did not show any acute changes. It was noted that the ventricles are rather prominent compared to sulci. While this finding may be due to atrophy, a degree of communicating hydrocephalus cannot be excluded on this study.   Laboratory Data: Lab Results  Component Value Date   TSH 0.661 09/23/2018   Lab Results  Component Value Date   [id number] 09/23/2018     PAST MEDICAL HISTORY: Past Medical History:  Diagnosis Date   Anxiety    Borderline diabetes    Constipation 12/02/2012   GERD (gastroesophageal reflux disease)    Hematuria 12/07/2014   Hemorrhoids    Hyperlipemia    Hypertension    IFG (impaired fasting glucose)    Lung nodules    Overweight (BMI 25.0-29.9) AUG 2011 205 lbs    Postmenopausal 12/07/2014   Tendonitis    Urinary frequency 12/07/2014  PAST SURGICAL HISTORY: Past Surgical History:  Procedure Laterality Date   ABDOMINAL HYSTERECTOMY     BACTERIAL OVERGROWTH TEST  01/01/2012   Procedure: BACTERIAL OVERGROWTH TEST;  Surgeon: Danie Binder, MD;  [facility];  Service: Endoscopy;  Laterality: N/A;  7:30   BREAST BIOPSY     BREAST EXCISIONAL  BIOPSY Left 1978   benign   BREAST SURGERY     tumor removed   COLONOSCOPY  2004   COLONOSCOPY  09/20/2010   [id number] polyps,internal hemorrhoids/polypoid lesion in the cecum   ESOPHAGOGASTRODUODENOSCOPY  09/20/2010   [id number] gastritis    MEDICATIONS: Current Outpatient Medications on File Prior to Visit  Medication Sig Dispense Refill   cholecalciferol (VITAMIN D3) 25 MCG (1000 UT) tablet Take 1,000 Units by mouth daily. Patient unsure of dose takes Vit D daily     Multiple Vitamins-Minerals (MULTIVITAMIN WITH MINERALS) tablet Take 1 tablet by mouth daily. Reported on 05/14/2015     aspirin EC 81 MG tablet Take 81 mg by mouth daily as needed.      No current facility-administered medications on file prior to visit.     ALLERGIES: Allergies  Allergen Reactions   Pravachol Other (See Comments)    Muscle aches    FAMILY HISTORY: Family History  Problem Relation Age of Onset   Hypertension Mother    Diabetes Mother    Hypertension Father    Diabetes Father    Heart attack Father    Cancer Brother    Colon cancer Neg Hx    Colon polyps Neg Hx     SOCIAL HISTORY: Social History   Socioeconomic History   Marital status: Single    Spouse name: Not on file   Number of children: Not on file   Years of education: Not on file   Highest education level: Not on file  Occupational History   Not on file  Social Needs   Financial resource strain: Not on file   Food insecurity    Worry: Not on file    Inability: Not on file   Transportation needs    Medical: Not on file    Non-medical: Not on file  Tobacco Use   Smoking status: Never Smoker   Smokeless tobacco: Never Used  Substance and Sexual Activity   Alcohol use: No   Drug use: No   Sexual activity: Not Currently    Birth control/protection: Surgical    Comment: hyst  Lifestyle   Physical activity    Days per week: Not on file    Minutes per session: Not on file    Stress: Not on file  Relationships   Social connections    Talks on phone: Not on file    Gets together: Not on file    Attends religious service: Not on file    Active member of club or organization: Not on file    Attends meetings of clubs or organizations: Not on file    Relationship status: Not on file   Intimate partner violence    Fear of current or ex partner: Not on file    Emotionally abused: Not on file    Physically abused: Not on file    Forced sexual activity: Not on file  Other Topics Concern   Not on file  Social History Narrative   Not on file    REVIEW OF SYSTEMS: Constitutional: No fevers, chills, or sweats, no generalized fatigue, change in appetite Eyes: No visual changes, double  vision, eye pain Ear, nose and throat: No hearing loss, ear pain, nasal congestion, sore throat Cardiovascular: No chest pain, palpitations Respiratory:  No shortness of breath at rest or with exertion, wheezes GastrointestinaI: No nausea, vomiting, diarrhea, abdominal pain, fecal incontinence Genitourinary:  No dysuria, urinary retention or frequency Musculoskeletal:  No neck pain, back pain Integumentary: No rash, pruritus, skin lesions Neurological: as above Psychiatric: No depression, insomnia, anxiety Endocrine: No palpitations, fatigue, diaphoresis, mood swings, change in appetite, change in weight, increased thirst Hematologic/Lymphatic:  No anemia, purpura, petechiae. Allergic/Immunologic: no itchy/runny eyes, nasal congestion, recent allergic reactions, rashes  PHYSICAL EXAM: Vitals:   12/04/18 1033  BP: 130/70  Pulse: 71  Temp: (!) 97 F (36.1 C)  SpO2: 98%   General: No acute distress Head:  Normocephalic/atraumatic Skin/Extremities: No rash, no edema Neurological Exam: Mental status: alert and oriented to person, place, and time, no dysarthria or aphasia, Fund of knowledge is appropriate.  Recent and remote memory are impaired.  Attention and concentration  are reduced. [place] Exam 12/04/2018  Weekday Correct 1  Current year 1  What state are we in? 1  Amount spent 1  Amount left 0  # of Animals 2  5 objects recall 0  Number series 0  Hour markers 2  Time correct 2  Placed X in triangle correctly 1  Largest Figure 1  Name of female 2  Date back to work 1  Type of work 2  State she lived in 0  Total score 17   Cranial nerves: CN I: not tested CN II: pupils equal, round and reactive to light, visual fields intact CN III, IV, VI:  full range of motion, no nystagmus, no ptosis CN V: facial sensation intact CN VII: upper and lower face symmetric CN VIII: hearing intact to conversation CN IX, X: gag intact, uvula midline CN XI: sternocleidomastoid and trapezius muscles intact CN XII: tongue midline Bulk & Tone: normal, no fasciculations. Motor: 5/5 throughout with no pronator drift. Sensation: intact to light touch, cold, pin, vibration and joint position sense.  No extinction to double simultaneous stimulation.  Romberg test negative Deep Tendon Reflexes: +2 throughout, no ankle clonus Plantar responses: downgoing bilaterally Cerebellar: no incoordination on finger to nose testing Gait: narrow-based and steady, able to tandem walk adequately. Tremor: none  IMPRESSION: This is a 73 year old right-handed woman with a history of diet-controlled hypertension, hyperlipidemia, presenting for evaluation of memory loss. Her neurological exam is non-focal, SLUMS score today 17/30. She has minimal insight into her condition, stating "I don't think I have a brain disorder." History sister provides included communication difficulties, hallucinations, confabulation. She repeats the same question about side effects of medication three times during today's visit. We discussed the diagnosis of dementia, etiology unclear, no parkinsonian signs, possible Lewy Body dementia due to hallucinations, versus frontotemporal dementia. MRI  brain without contrast will be ordered to assess for underlying structural abnormality. We discussed starting Donepezil, including side effects and expectations from medication. We discussed close monitoring of driving and close family supervision. We discussed the importance of advanced care planning since she lives alone, appointing a POA. Follow-up in 6 months, they know to call for any changes.   Thank you for allowing me to participate in the care of this patient. Please do not hesitate to call for any questions or concerns.   Ellouise Newer, M.D.  CC: Dr. Wolfgang Phoenix

## 2018-12-16 ENCOUNTER — Ambulatory Visit (HOSPITAL_COMMUNITY)
Admission: RE | Admit: 2018-12-16 | Discharge: 2018-12-16 | Disposition: A | Discharge status: Home / Self Care | Payer: Medicare Other | Source: Ambulatory Visit | Attending: Obstetrics and Gynecology | Admitting: Obstetrics and Gynecology

## 2018-12-16 ENCOUNTER — Other Ambulatory Visit: Payer: Self-pay

## 2018-12-16 DIAGNOSIS — Z1231 Encounter for screening mammogram for malignant neoplasm of breast: Secondary | ICD-10-CM | POA: Diagnosis present

## 2018-12-22 ENCOUNTER — Ambulatory Visit
Admission: RE | Admit: 2018-12-22 | Discharge: 2018-12-22 | Disposition: A | Discharge status: Home / Self Care | Payer: Medicare Other | Source: Ambulatory Visit | Attending: Neurology | Admitting: Neurology

## 2018-12-22 ENCOUNTER — Other Ambulatory Visit: Payer: Self-pay

## 2018-12-22 DIAGNOSIS — F03A0 Unspecified dementia, mild, without behavioral disturbance, psychotic disturbance, mood disturbance, and anxiety: Secondary | ICD-10-CM

## 2018-12-22 DIAGNOSIS — F0390 Unspecified dementia without behavioral disturbance: Secondary | ICD-10-CM

## 2018-12-23 ENCOUNTER — Telehealth: Payer: Self-pay

## 2018-12-23 NOTE — Telephone Encounter (Signed)
Left message on home voicemail of results. Instructed pt to call the office back and let us know if she would like to move forward with the Neurocognitive testing. If so order will need to be placed.

## 2018-12-23 NOTE — Telephone Encounter (Signed)
-----   Message from Cameron Sprang, MD sent at 12/23/2018  5:25 AM EST ----- Pls let patient/sister know that the MRI brain did not show any evidence of tumor, stroke, or bleed. It showed the brain has gotten smaller, consistent with the diagnosis of dementia that we had discussed. If she has further concerns about the diagnosis, recommend doing Neurocognitive testing to further evaluate her memory. Thanks

## 2019-01-16 ENCOUNTER — Encounter: Payer: Self-pay | Admitting: Orthopaedic Surgery

## 2019-01-16 ENCOUNTER — Other Ambulatory Visit: Payer: Self-pay

## 2019-01-16 ENCOUNTER — Ambulatory Visit (INDEPENDENT_AMBULATORY_CARE_PROVIDER_SITE_OTHER): Payer: Medicare Other | Admitting: Orthopaedic Surgery

## 2019-01-16 DIAGNOSIS — M1711 Unilateral primary osteoarthritis, right knee: Secondary | ICD-10-CM | POA: Diagnosis not present

## 2019-01-16 NOTE — Progress Notes (Signed)
Office Visit Note   Patient: Erin Wall           Date of Birth: 1945-09-01           MRN: [id number] Visit Date: 01/16/2019              Requested by: Mikey Kirschner, [address] [place],  [place] [id number] PCP: Mikey Kirschner, MD   Assessment & Plan: Visit Diagnoses:  1. Unilateral primary osteoarthritis, right knee     Plan: Right knee injection performed which gave her good relief.  We discussed the previous x-rays she has had with some knee osteoarthritis it certainly does not look severe.  We can check her back again in 1 to 2 months if she is having persistent symptoms.  Follow-Up Instructions: No follow-ups on file.   Orders:  Orders Placed This Encounter  Procedures   Large Joint Inj: R knee   No orders of the defined types were placed in this encounter.     Procedures: Large Joint Inj: R knee on 01/16/2019 11:12 AM Indications: pain and joint swelling Details: 22 G 1.5 in needle, anterolateral approach  Arthrogram: No  Medications: 4 mL bupivacaine 0.25 %; 0.5 mL lidocaine 1 %; 40 mg methylPREDNISolone acetate 40 MG/ML Outcome: tolerated well, no immediate complications Procedure, treatment alternatives, risks and benefits explained, specific risks discussed. Consent was given by the patient. Immediately prior to procedure a time out was called to verify the correct patient, procedure, equipment, support staff and site/side marked as required. Patient was prepped and draped in the usual sterile fashion.       Clinical Data: No additional findings.   Subjective: Chief Complaint  Patient presents with   Right Knee - Pain    HPI 73 year old female who is here with her sister is a patient of mine has had problems with her right knee since she fell in August.  She states the right knee has been swelling painful causes her to limp she has more pain laterally.  She is used ibuprofen also Aleve as well as ice with some relief.  She  denies true locking.  A couple times she has had to grab the wall with not actually falling.  She not been using a cane.  Review of Systems 14 point systems positive for hypertension, impaired fasting glucose.  Negative for heart attack stroke.  She takes medication for hypertension she does not smoke.  No previous surgeries.   Objective: Vital Signs: BP (!) 142/94    Pulse 68    Ht 5' 11.5" (1.816 m)    Wt 169 lb (76.7 kg)    BMI 23.24 kg/m   Physical Exam Constitutional:      Appearance: She is well-developed.  HENT:     Head: Normocephalic.     Right Ear: External ear normal.     Left Ear: External ear normal.  Eyes:     Pupils: Pupils are equal, round, and reactive to light.  Neck:     Thyroid: No thyromegaly.     Trachea: No tracheal deviation.  Cardiovascular:     Rate and Rhythm: Normal rate.  Pulmonary:     Effort: Pulmonary effort is normal.  Abdominal:     Palpations: Abdomen is soft.  Skin:    General: Skin is warm and dry.  Neurological:     Mental Status: She is alert and oriented to person, place, and time.  Psychiatric:  Behavior: Behavior normal.     Ortho Exam left knee is straight right knee has some genu valgum of about 10 degrees.  Palpable alpha lateral osteophytes are noted.  Reflexes are intact no sciatic notch tenderness leg lengths are equal.  Right knee reaches within a few degrees full extension flexion 120 degrees 2+ knee effusion.  Specialty Comments:  No specialty comments available.  Imaging: Previous x-rays 11/15/2018 - for acute changes but lateral joint space narrowing lateral osteophytes slight valgus right knee.   PMFS History: Patient Active Problem List   Diagnosis Date Noted   Unilateral primary osteoarthritis, right knee 01/17/2019   Well woman exam with routine gynecological exam 12/17/2017   Screening for colorectal cancer 12/17/2017   Urinary frequency 12/07/2014   Hematuria 12/07/2014   Postmenopausal  12/07/2014   Constipation 12/02/2012   Unspecified hereditary and idiopathic peripheral neuropathy 11/15/2012   Impaired fasting glucose 11/15/2012   Arthritis 05/09/2012   Other and unspecified hyperlipidemia 05/09/2012   Lymphocytosis 05/09/2012   Elevated blood pressure 05/09/2012   Weight loss 08/24/2010   FLATULENCE 10/13/2009   ABDOMINAL PAIN, EPIGASTRIC 08/28/2008   Past Medical History:  Diagnosis Date   Anxiety    Borderline diabetes    Constipation 12/02/2012   GERD (gastroesophageal reflux disease)    Hematuria 12/07/2014   Hemorrhoids    Hyperlipemia    Hypertension    IFG (impaired fasting glucose)    Lung nodules    Overweight (BMI 25.0-29.9) AUG 2011 205 lbs    Postmenopausal 12/07/2014   Tendonitis    Urinary frequency 12/07/2014    Family History  Problem Relation Age of Onset   Hypertension Mother    Diabetes Mother    Hypertension Father    Diabetes Father    Heart attack Father    Cancer Brother    Colon cancer Neg Hx    Colon polyps Neg Hx     Past Surgical History:  Procedure Laterality Date   ABDOMINAL HYSTERECTOMY     BACTERIAL OVERGROWTH TEST  01/01/2012   Procedure: BACTERIAL OVERGROWTH TEST;  Surgeon: Danie Binder, MD;  Location: [facility];  Service: Endoscopy;  Laterality: N/A;  7:30   BREAST BIOPSY     BREAST EXCISIONAL BIOPSY Left 1978   benign   BREAST SURGERY     tumor removed   COLONOSCOPY  2004   COLONOSCOPY  09/20/2010   [id number] polyps,internal hemorrhoids/polypoid lesion in the cecum   ESOPHAGOGASTRODUODENOSCOPY  09/20/2010   [id number] gastritis   Social History   Occupational History   Not on file  Tobacco Use   Smoking status: Never Smoker   Smokeless tobacco: Never Used  Substance and Sexual Activity   Alcohol use: No   Drug use: No   Sexual activity: Not Currently    Birth control/protection: Surgical    Comment: hyst

## 2019-01-17 ENCOUNTER — Encounter: Payer: Self-pay | Admitting: Orthopaedic Surgery

## 2019-01-17 DIAGNOSIS — M1711 Unilateral primary osteoarthritis, right knee: Secondary | ICD-10-CM | POA: Insufficient documentation

## 2019-01-17 HISTORY — DX: Unilateral primary osteoarthritis, right knee: M17.11

## 2019-01-17 MED ORDER — METHYLPREDNISOLONE ACETATE 40 MG/ML IJ SUSP
40.0000 mg | INTRAMUSCULAR | Status: AC | PRN
  Administered 2019-01-16: 40 mg via INTRA_ARTICULAR

## 2019-01-17 MED ORDER — BUPIVACAINE HCL 0.25 % IJ SOLN
4.0000 mL | INTRAMUSCULAR | Status: AC | PRN
  Administered 2019-01-16: 4 mL via INTRA_ARTICULAR

## 2019-01-17 MED ORDER — LIDOCAINE HCL 1 % IJ SOLN
0.5000 mL | INTRAMUSCULAR | Status: AC | PRN
  Administered 2019-01-16: .5 mL

## 2019-02-17 ENCOUNTER — Telehealth: Payer: Self-pay | Admitting: Adult Health

## 2019-02-17 NOTE — Telephone Encounter (Signed)
Called patient regarding appointment scheduled in our office and advised to come alone to the visit, however, a support person, over age 74, may accompany her to appointment if assistance is needed for safety or care concerns. Otherwise, support persons should remain outside until the visit is complete.   Prescreen questions asked: 1. Any of the following symptoms of COVID such as chills, fever, cough, shortness of breath, muscle pain, diarrhea, rash, vomiting, abdominal pain, red eye, weakness, bruising, bleeding, joint pain, loss of taste or smell, a severe headache, sore throat, fatigue 2. Any exposure to anyone suspected or confirmed of having COVID-19 3. Awaiting test results for COVID-19  Also,to keep you safe, please use the provided hand sanitizer when you enter the office. We are asking everyone in the office to wear a mask to help prevent the spread of germs. If you have a mask of your own, please wear it to your appointment, if not, we are happy to provide one for you.  Thank you for understanding and your cooperation.    [facility] Staff      

## 2019-02-18 ENCOUNTER — Other Ambulatory Visit: Payer: Self-pay

## 2019-02-18 ENCOUNTER — Ambulatory Visit (INDEPENDENT_AMBULATORY_CARE_PROVIDER_SITE_OTHER): Payer: Medicare PPO | Admitting: Adult Health

## 2019-02-18 ENCOUNTER — Encounter: Payer: Self-pay | Admitting: Adult Health

## 2019-02-18 VITALS — BP 127/84 | HR 73 | Ht 71.5 in | Wt 174.2 lb

## 2019-02-18 DIAGNOSIS — Z1211 Encounter for screening for malignant neoplasm of colon: Secondary | ICD-10-CM | POA: Diagnosis not present

## 2019-02-18 DIAGNOSIS — Z01419 Encounter for gynecological examination (general) (routine) without abnormal findings: Secondary | ICD-10-CM | POA: Diagnosis not present

## 2019-02-18 LAB — HEMOCCULT GUIAC POC 1CARD (OFFICE): Fecal Occult Blood, POC: NEGATIVE

## 2019-02-18 NOTE — Progress Notes (Addendum)
Patient ID: [name], female   DOB: [birth year], 74 y.o.   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  PCP is Dr Mickie Hillier.  Current Medications, Allergies, Past Medical History, Past Surgical History, Family History and Social History were reviewed in [employer] record.     Review of Systems: Patient denies any headaches, hearing loss, fatigue, blurred vision, shortness of breath, chest pain, abdominal pain, problems with bowel movements, urination(has occasional loss of urine), or intercourse(not active). No mood swings. Has pain in right knee at times, she said she fell on her deck last year.    Physical Exam:BP 127/84 (BP Location: Left Arm, Patient Position: Sitting, Cuff Size: Normal)   Pulse 73   Ht 5' 11.5" (1.816 m)   Wt 174 lb 3.2 oz (79 kg)   BMI 23.96 kg/m  General:  Well developed, well nourished, no acute distress, good hygiene  Skin:  Warm and dry Neck:  Midline trachea, normal thyroid, good ROM, no lymphadenopathy,no carotid bruits heard  Lungs; Clear to auscultation bilaterally Breast:  No dominant palpable mass, retraction, or nipple discharge Cardiovascular: Regular rate and rhythm Abdomen:  Soft, non tender, no hepatosplenomegaly Pelvic:  External genitalia is normal in appearance, no lesions.  The vagina is atrophic. Urethra has no lesions or masses. The cervix and uterus are absent.  No adnexal masses or tenderness noted.Bladder is non tender, no masses felt. Rectal: Good sphincter tone, no polyps, or hemorrhoids felt.  Hemoccult negative. Extremities/musculoskeletal:  No swelling or varicosities noted, no clubbing or cyanosis Psych:  No mood changes, alert and cooperative,seems happy, but not as  sharp as she used to be, but answered questions appropriately.  Fall risk is high PHQ 2 score 0. Examination chaperoned by Rolena Infante LPN.  She was assisted on and off the exam table.  Impression and Plan: 1. Well woman exam with routine gynecological exam Physical in 2 years if desired Labs with PCP Mammogram yearly Try to void more often, can use pantie liner Can use heat and ice on knee, is arthritis, probably from injury and age (she saw Dr Lorin Mercy 01/16/19 and it was diagnosed as OA right knee and she had an injection then)  2. Screening for colorectal cancer Colonoscopy per GI

## 2019-03-20 ENCOUNTER — Encounter: Payer: Self-pay | Admitting: Family Medicine

## 2019-07-09 ENCOUNTER — Encounter: Payer: Self-pay | Admitting: Neurology

## 2019-07-09 ENCOUNTER — Ambulatory Visit: Payer: Medicare PPO | Admitting: Neurology

## 2019-07-09 ENCOUNTER — Other Ambulatory Visit: Payer: Self-pay

## 2019-07-09 VITALS — BP 148/82 | HR 69 | Ht 71.5 in | Wt 180.2 lb

## 2019-07-09 DIAGNOSIS — F03A0 Unspecified dementia, mild, without behavioral disturbance, psychotic disturbance, mood disturbance, and anxiety: Secondary | ICD-10-CM

## 2019-07-09 DIAGNOSIS — F0390 Unspecified dementia without behavioral disturbance: Secondary | ICD-10-CM

## 2019-07-09 MED ORDER — DONEPEZIL HCL 10 MG PO TABS: ORAL_TABLET | ORAL | 3 refills | Status: DC

## 2019-07-09 NOTE — Patient Instructions (Signed)
1. Schedule Neurocognitive testing  2. Continue Donepezil 10mg  every night, take with food to help prevent upset stomach  3. Follow-up in 6 months, call for any changes  FALL PRECAUTIONS: Be cautious when walking. Scan the area for obstacles that may increase the risk of trips and falls. When getting up in the mornings, sit up at the edge of the bed for a few minutes before getting out of bed. Consider elevating the bed at the head end to avoid drop of blood pressure when getting up. Walk always in a well-lit room (use night lights in the walls). Avoid area rugs or power cords from appliances in the middle of the walkways. Use a walker or a cane if necessary and consider physical therapy for balance exercise. Get your eyesight checked regularly.  FINANCIAL OVERSIGHT: Supervision, especially oversight when making financial decisions or transactions is also recommended.  HOME SAFETY: Consider the safety of the kitchen when operating appliances like stoves, microwave oven, and blender. Consider having supervision and share cooking responsibilities until no longer able to participate in those. Accidents with firearms and other hazards in the house should be identified and addressed as well.  DRIVING: Regarding driving, in patients with progressive memory problems, driving will be impaired. We advise to have someone else do the driving if trouble finding directions or if minor accidents are reported. Independent driving assessment is available to determine safety of driving.  ABILITY TO BE LEFT ALONE: If patient is unable to contact 911 operator, consider using LifeLine, or when the need is there, arrange for someone to stay with patients. Smoking is a fire hazard, consider supervision or cessation. Risk of wandering should be assessed by caregiver and if detected at any point, supervision and safe proof recommendations should be instituted.  MEDICATION SUPERVISION: Inability to self-administer medication  needs to be constantly addressed. Implement a mechanism to ensure safe administration of the medications.  RECOMMENDATIONS FOR ALL PATIENTS WITH MEMORY PROBLEMS: 1. Continue to exercise (Recommend 30 minutes of walking everyday, or 3 hours every week) 2. Increase social interactions - continue going to [place] and enjoy social gatherings with friends and family 3. Eat healthy, avoid fried foods and eat more fruits and vegetables 4. Maintain adequate blood pressure, blood sugar, and blood cholesterol level. Reducing the risk of stroke and cardiovascular disease also helps promoting better memory. 5. Avoid stressful situations. Live a simple life and avoid aggravations. Organize your time and prepare for the next day in anticipation. 6. Sleep well, avoid any interruptions of sleep and avoid any distractions in the bedroom that may interfere with adequate sleep quality 7. Avoid sugar, avoid sweets as there is a strong link between excessive sugar intake, diabetes, and cognitive impairment The Mediterranean diet has been shown to help patients reduce the risk of progressive memory disorders and reduces cardiovascular risk. This includes eating fish, eat fruits and green leafy vegetables, nuts like almonds and hazelnuts, walnuts, and also use olive oil. Avoid fast foods and fried foods as much as possible. Avoid sweets and sugar as sugar use has been linked to worsening of memory function.  There is always a concern of gradual progression of memory problems. If this is the case, then we may need to adjust level of care according to patient needs. Support, both to the patient and caregiver, should then be put into place.

## 2019-07-09 NOTE — Progress Notes (Signed)
NEUROLOGY FOLLOW UP OFFICE NOTE  Erin Wall [id number] [birth year]  HISTORY OF PRESENT ILLNESS: I had the pleasure of seeing Erin Wall in follow-up in the neurology clinic on 07/09/2019. She is again accompanied by her sister who helps supplement the history today.  The patient was last seen 7 months ago for mild dementia, etiology unclear. She has minimal insight into her condition. On her initial visit, family reported difficulty managing finances and getting lost driving. Her sister also reported hallucinations, seeing children in the house or saying someone got her shoes when she could not find it.  Records and images were personally reviewed where available.  I personally reviewed MRI brain without contrast done 12/2018 which did not show any acute changes, there was advanced atrophy for age, mild chronic microvascular disease. She continues to have no insight and feels she is doing fine. She lives alone, her sister calls her every night, they talk 10 times a day. She denies getting lost driving, she only drives during the daytime. All her bills are on autopay. She states she is taking the Donepezil 10mg  every night, "tries to remember." She occasionally misplaces things. Her sister reports memory is pretty much the same. Some days she gets confused in the afternoon, needing constant reminds that she is at her home. She continues to have hallucinations of children, or saying her brother was there, mostly in the evening hours. She does not remember family members who have passed away. She has occasional abdominal pain, no headaches, dizziness, no falls. Sleep is good. She needs constant repeated reminders to keep her mask on.   History on Initial Assessment 12/04/2018: This is a 74 year old right-handed woman with a history of diet-controlled hypertension, hyperlipidemia, presenting for evaluation of memory loss. Her sister Erin Wall is present during the visit to provide additional information.  The patient reports that her memory is not bad, but not as sharp. She lives alone. She denies getting lost driving. She reports taking medications regularly. She reports missing a bill payment in Feb/March. She forgets names but not very often. She occasionally misplaces things. Her sister provides a different history. Erin Wall reports the patient started having memory changes a year ago. She is more forgetful, worse in the evening hours. She has lost her keys several times. In Feb/March, utilities were cut off, including her electricity, cable, and cell phone, she had bills up to $300-400, family helped her fix this and put everything on draft. She was getting lost driving Erin Wall disclosed this privately). Erin Wall reports she gets very confused when she wakes up from dreams. She states she is having dreams. She has sundowning, getting confused and having difficulty understanding what people are telling her. She knows what she wants to say but gets tangled up getting them out. Over the past 1-2 months, she started having hallucinations, telling her sister that there are children in the house or someone had come or their brother who lives out of town is there. She loses things and says somebody got her shoes and found it in her car. She states there was a bazaar that occurred. She is independent with dressing and bathing, no hygiene concerns. Her father had dementia in his 61s. She denies any history of significant head injuries or alcohol use.  She has urinary frequency. She denies any headaches, dizziness, diplopia, dysarthria, dysphagia, neck/back pain, focal numbness/tingling/weakness, bowel dysfunction. No anosmia, tremors, no falls. Sleep is good. She fell a few months ago on her deck and  injured her knee. She states her mood is great. She is a retired Pharmacist, [employer].  I personally reviewed head CT without contrast done 09/2018 which did not show any acute changes. It was noted that  the ventricles are rather prominent compared to sulci. While this finding may be due to atrophy, a degree of communicating hydrocephalus cannot be excluded on this study.    Laboratory Data: Lab Results  Component Value Date   TSH 0.661 09/23/2018   Lab Results  Component Value Date   [id number] 09/23/2018       PAST MEDICAL HISTORY: Past Medical History:  Diagnosis Date  . Anxiety   . Borderline diabetes   . Constipation 12/02/2012  . GERD (gastroesophageal reflux disease)   . Hematuria 12/07/2014  . Hemorrhoids   . Hyperlipemia   . Hypertension   . IFG (impaired fasting glucose)   . Lung nodules   . Mental disorder    dementia   . Overweight (BMI 25.0-29.9) AUG 2011 205 lbs   . Postmenopausal 12/07/2014  . Tendonitis   . Urinary frequency 12/07/2014    MEDICATIONS: Current Outpatient Medications on File Prior to Visit  Medication Sig Dispense Refill  . donepezil (ARICEPT) 10 MG tablet Take 1/2 tablet daily for 2 weeks, then increase to 1 tablet daily 30 tablet 11   No current facility-administered medications on file prior to visit.    ALLERGIES: Allergies  Allergen Reactions  . Pravachol Other (See Comments)    Muscle aches    FAMILY HISTORY: Family History  Problem Relation Age of Onset  . Hypertension Mother   . Diabetes Mother   . Hypertension Father   . Diabetes Father   . Heart attack Father   . Cancer Brother   . Colon cancer Neg Hx   . Colon polyps Neg Hx     SOCIAL HISTORY: Social History   Socioeconomic History  . Marital status: Single    Spouse name: Not on file  . Number of children: Not on file  . Years of education: Not on file  . Highest education level: Not on file  Occupational History  . Occupation: retired Pharmacist, hospital   Tobacco Use  . Smoking status: Never Smoker  . Smokeless tobacco: Never Used  Substance and Sexual Activity  . Alcohol use: No  . Drug use: No  . Sexual activity: Not Currently    Birth  control/protection: Surgical, Post-menopausal    Comment: hyst  Other Topics Concern  . Not on file  Social History Narrative   Lives one level lives alone   Right handed   Caffeine - not much    Exercise - some   Education - masters   Social Determinants of Health   Financial Resource Strain:   . Difficulty of Paying Living Expenses:   Food Insecurity:   . Worried About Charity fundraiser in the Last Year:   . Arboriculturist in the Last Year:   Transportation Needs:   . Film/video editor (Medical):   Marland Kitchen Lack of Transportation (Non-Medical):   Physical Activity:   . Days of Exercise per Week:   . Minutes of Exercise per Session:   Stress:   . Feeling of Stress :   Social Connections:   . Frequency of Communication with Friends and Family:   . Frequency of Social Gatherings with Friends and Family:   . Attends Religious Services:   . Active Member of Clubs or  Organizations:   . Attends Archivist Meetings:   Marland Kitchen Marital Status:   Intimate Partner Violence:   . Fear of Current or Ex-Partner:   . Emotionally Abused:   Marland Kitchen Physically Abused:   . Sexually Abused:     PHYSICAL EXAM: Vitals:   07/09/19 1458  BP: (!) 148/82  Pulse: 69  SpO2: 99%   General: No acute distress Head:  Normocephalic/atraumatic Skin/Extremities: No rash, no edema Neurological Exam: alert and oriented to person, place, state it is May 2020. No aphasia or dysarthria. Fund of knowledge is reduced.  Recent and remote memory are impaired. Attention and concentration are reduced, able to spell WORLD backward, unable to do serial 7s. Cranial nerves: Pupils equal, round, reactive to light. Extraocular movements intact with no nystagmus. Visual fields full. No facial asymmetry.Motor: Bulk and tone normal, muscle strength 5/5 throughout with no pronator drift. Finger to nose testing intact.  Gait narrow-based and steady, able to tandem walk adequately.  Romberg negative.   IMPRESSION: This  is a 74 yo RH woman with a history of diet-controlled hypertension, hyperlipidemia, with symptoms concerning for mild dementia, etiology unclear. SLUMS score 17/30 in 11/2018. She continues to have no insight into her condition. Neuropsychological evaluation to further evaluate cognitive complaints to help guide long-term management. She lives alone, family checks on her, but will likely need increasing supervision as condition progresses. Continue Donepezil 10mg  daily. Continue to monitor driving. Follow-up in 6 months, they know to call for any changes.   Thank you for allowing me to participate in her care.  Please do not hesitate to call for any questions or concerns.   Ellouise Newer, M.D.   CC: Dr. Wolfgang Phoenix

## 2019-08-13 ENCOUNTER — Ambulatory Visit: Payer: Medicare PPO

## 2019-08-13 ENCOUNTER — Ambulatory Visit (INDEPENDENT_AMBULATORY_CARE_PROVIDER_SITE_OTHER): Payer: Medicare PPO | Admitting: Psychology

## 2019-08-13 ENCOUNTER — Other Ambulatory Visit: Payer: Self-pay

## 2019-08-13 ENCOUNTER — Encounter: Payer: Self-pay | Admitting: Psychology

## 2019-08-13 DIAGNOSIS — F0281 Dementia in other diseases classified elsewhere with behavioral disturbance: Secondary | ICD-10-CM

## 2019-08-13 DIAGNOSIS — F0390 Unspecified dementia without behavioral disturbance: Secondary | ICD-10-CM

## 2019-08-13 DIAGNOSIS — F0150 Vascular dementia without behavioral disturbance: Secondary | ICD-10-CM

## 2019-08-13 DIAGNOSIS — F03A0 Unspecified dementia, mild, without behavioral disturbance, psychotic disturbance, mood disturbance, and anxiety: Secondary | ICD-10-CM

## 2019-08-13 HISTORY — DX: Unspecified dementia, unspecified severity, without behavioral disturbance, psychotic disturbance, mood disturbance, and anxiety: F03.90

## 2019-08-13 NOTE — Patient Instructions (Signed)
Clinical Impression(s): Ms. Eppard' pattern of performance is suggestive of fairly diffuse cognitive impairment with most prominent areas of weakness surrounding learning and memory, cognitive flexibility, and expressive language. Additional weaknesses were exhibited across processing speed, working memory, and spatial tasks requiring spatial processing and/or manipulation. Strengths were exhibited across basic attention, receptive language, and visuoconstructional tasks. Ms. Whitis' sister reported her sister exhibiting difficulties completing instrumental activities of daily living (ADLs), especially surrounding financial management and bill paying. There was also report of Ms. Bloxham previously getting lost while driving. This, coupled with evidence for significant cognitive dysfunction described above, suggests that she meets criteria for a Major Neurocognitive Disorder (formerly "dementia") at the present time.  Specific to memory, Ms. Treinen had pronounced difficulty retrieving previously learned information and retention percentages were 0% across all utilized memory measures. She also performed poorly across recognition trials, raising concerns for a rapid forgetting memory profile. This, coupled with deficits in confrontation naming, a noted discrepancy between phonemic and semantic fluency scores, and deficits in visuospatial abilities all raise concerns for the presence of Alzheimer's disease. Overall, I feel that this represents the most likely etiology at the present time. Despite this, I cannot rule out a Lewy body dementia presentation. Ms. Rorke and her sister acknowledged ongoing fully-formed visual hallucinations and deficits across cognitive flexibility and visuospatial functioning would be consistent with this condition. However, she demonstrated intact visuoconstructional abilities, which would be atypical, and she did not describe other common behavioral characteristics of this condition  (e.g., parkinsonisms, REM sleep disorder, or fluctuations in alertness). Memory deficits were also quite pronounced, which is also inconsistent with this earlier presentations of this illness, therefore making it less likely. Continued medical monitoring will be important moving forward.

## 2019-08-13 NOTE — Progress Notes (Signed)
NEUROPSYCHOLOGICAL EVALUATION [facility] of Neurology  Date of Evaluation: August 13, 2019  Reason for Referral:   Erin Wall is a 74 y.o. right-handed African-American female referred by Ellouise Newer, M.D., to characterize her current cognitive functioning and assist with diagnostic clarity and treatment planning in the context of subjective cognitive decline and concerns for a neurodegenerative illness.   Assessment and Plan:   Clinical Impression(s): Erin Wall' pattern of performance is suggestive of fairly diffuse cognitive impairment with most prominent areas of weakness surrounding learning and memory, cognitive flexibility, and expressive language. Additional weaknesses were exhibited across processing speed, working memory, and spatial tasks requiring spatial processing and/or manipulation. Strengths were exhibited across basic attention, receptive language, and visuoconstructional tasks. Erin Wall' sister reported her sister exhibiting difficulties completing instrumental activities of daily living (ADLs), especially surrounding financial management and bill paying. There was also report of Erin Wall previously getting lost while driving. This, coupled with evidence for significant cognitive dysfunction described above, suggests that she meets criteria for a Major Neurocognitive Disorder (formerly "dementia") at the present time.  Specific to memory, Erin Wall had pronounced difficulty retrieving previously learned information and retention percentages were 0% across all utilized memory measures. She also performed poorly across recognition trials, raising concerns for a rapid forgetting memory profile. This, coupled with deficits in confrontation naming, a noted discrepancy between phonemic and semantic fluency scores, and deficits in visuospatial abilities all raise concerns for the presence of Alzheimer's disease. Overall, I feel that  this represents the most likely etiology at the present time. Despite this, I cannot rule out a Lewy body dementia presentation. Erin Wall and her sister acknowledged ongoing fully-formed visual hallucinations and deficits across cognitive flexibility and visuospatial functioning would be consistent with this condition. However, she demonstrated intact visuoconstructional abilities, which would be atypical, and she did not describe other common behavioral characteristics of this condition (e.g., parkinsonisms, REM sleep disorder, or fluctuations in alertness). Memory deficits were also quite pronounced, which is also inconsistent with this earlier presentations of this illness, therefore making it less likely. Continued medical monitoring will be important moving forward.  Recommendations: A future repeat neuropsychological evaluation would be recommended if there is a desire to document further changes over time or if there has been a significant decline in cognitive of functional abilities.  If further diagnostic clarity is desired, Erin Wall and her family could speak with Dr. Delice Lesch about completing an FDG-PET scan. However, it is unlikely that the results of this scan would dramatically alter treatment moving forward.   Should there be a progression of her current deficits over time, Erin Wall is unlikely to regain any independent living skills lost. Therefore, it is recommended that she remain as involved as possible in all aspects of household chores, finances, and medication management, with supervision to ensure adequate performance. She will likely benefit from the establishment and maintenance of a routine in order to maximize her functional abilities over time.  It will be important for Erin Wall to have another person with her when in situations where she may need to process information, weigh the pros and cons of different options, and make decisions, in order to ensure that she fully  understands and recalls all information to be considered.  If not already done, Erin Wall and her family may want to discuss her wishes regarding durable power of attorney and medical decision making, so that she can have input into these choices. Additionally,  they may wish to discuss future plans for caretaking and seek out community options for in home/residential care should they become necessary. During the current evaluation, she performed well on a task assessing safety and judgment. However, these abilities are expected to decline over time with the presence of a neurodegenerative illness.   Performance across neurocognitive testing is not a strong predictor of an individual's safety operating a motor vehicle. However, Erin Wall did demonstrate significant cognitive dysfunction, especially across processing speed, cognitive flexibility, and visuospatial functioning. I would recommend that she not continue to drive pending a formal driving evaluation. Should her family wish to pursue a formalized driving evaluation, they would be encouraged to contact [employer] in [place], [place] at [phone]. Another option would be through [facility]; however, the latter would likely require a referral from a medical doctor. [facility] can be reached directly at [phone].   Important information to be remembered should be provided in written format in all instances. This should be placed in a highly visible and commonly frequented area of her residence to promote recall.   To address problems with processing speed, she may wish to consider:   -Ensuring that she is alerted when essential material or instructions are being presented   -Adjusting the speed at which new information is presented   -Allowing for more time in comprehending, processing, and responding in conversation  To address problems with working memory and executive dysfunction, she may wish to consider:    -Avoiding external distractions when needing to concentrate   -Limiting exposure to fast paced environments with multiple sensory demands   -Writing down complicated information and using checklists   -Attempting and completing one task at a time (i.e., no multi-tasking)   -Verbalizing aloud each step of a task to maintain focus   -Reducing the amount of information considered at one time  Review of Records:   Erin Wall was seen by [facility] Marland KitchenEllouise Newer, M.D.) on 07/09/2019 for follow-up of cognitive dysfunction. Briefly, Erin Wall described her memory as not bad, but not as sharp. She may forget names and misplace things, but these were described to happen occasionally. She lives alone. She denied getting lost driving and reported taking medications regularly. She did acknowledge missing a bill payment in Feb/March. However, her sister [name] provided a much different perspective. [name] reported that her sister started having memory changes a year ago. She is more forgetful and symptoms are worse in the evening hours. She has lost her keys several times. In Feb/March, utilities were cut off, including her electricity, cable, and cell phone. She had bills up to $300-400. Her family was able to help her fix this and put everything on auto-draft. Erin Wall was also said to have gotten lost while driving. She also noted that her sister seems to get very confused when she wakes up from dreams. She also gets confused and has been having difficulty understanding what people are telling her. She seems to know what she wants to say but gets tangled up getting it out. Over the past 1-2 months, she started having hallucinations, telling her sister that there were children in the house, someone had come over, or that their brother who lives out of town is there. She is independent with dressing and bathing and no hygiene concerns were expressed. Performance on a brief cognitive screening instrument  (SLUMS) in October 2020 was 17/30. Ultimately, Erin Wall was referred for a comprehensive neuropsychological evaluation to  characterize her cognitive abilities and to assist with diagnostic clarity and treatment planning.   Head CT on 10/08/2018 revealed prominent ventricles, potentially due to atrophy. A degree of communicating hydrocephalus was unable to be excluded. Brain MRI on 12/22/2018 revealed advanced atrophy for her age and mild chronic ischemic microangiopathy.   Past Medical History:  Diagnosis Date  . Arthritis 05/09/2012  . Borderline diabetes   . Generalized anxiety disorder   . GERD (gastroesophageal reflux disease)   . Hematuria 12/07/2014  . Hemorrhoids   . Hyperlipemia   . Hyperlipidemia 05/09/2012  . Hypertension   . Impaired fasting glucose 11/15/2012  . Lung nodules   . Lymphocytosis 05/09/2012  . Postmenopausal 12/07/2014  . Tendonitis   . Unilateral primary osteoarthritis, right knee 01/17/2019  . Urinary frequency 12/07/2014    Past Surgical History:  Procedure Laterality Date  . ABDOMINAL HYSTERECTOMY    . BACTERIAL OVERGROWTH TEST  01/01/2012   Procedure: BACTERIAL OVERGROWTH TEST;  Surgeon: Danie Binder, MD;  Location: [facility];  Service: Endoscopy;  Laterality: N/A;  7:30  . BREAST BIOPSY    . BREAST EXCISIONAL BIOPSY Left 1978   benign  . BREAST SURGERY     tumor removed  . COLONOSCOPY  2004  . COLONOSCOPY  09/20/2010   DXI:PJASNKNL polyps,internal hemorrhoids/polypoid lesion in the cecum  . ESOPHAGOGASTRODUODENOSCOPY  09/20/2010   ZJQ:BHALPFXTK/WIOX gastritis    Current Outpatient Medications:  .  donepezil (ARICEPT) 10 MG tablet, Take 1 tablet every night, Disp: 90 tablet, Rfl: 3  Clinical Interview:   Cognitive Symptoms: Decreased short-term memory: Endorsed. She noted occasional difficulties misplacing objects over the past year. She largely denied trouble remembering the details of previous conversations, names of familiar individuals, or  upcoming appointments. Her sister [name] emphasized the misplacing of objects, as well as her sister often repeating herself. [name] also described instances where her sister will not remember that certain family members have passed away, or that an old home in [place] had been sold and that she no longer lives at that home. [name] noted that memory difficulties have been present for over the past couple of years and have exhibited a gradual decline over that time.  Decreased long-term memory: Denied. Decreased attention/concentration: Denied. Her sister reported some trouble with attention/concentration and increased distractibility.  Reduced processing speed: Endorsed "occasionally." Difficulties with executive functions: Endorsed. Specifically, she described difficulties with organization; however, some of these deficits may be longstanding in nature. She alluded to some additional trouble with indecisiveness. She denied trouble with impulsivity or using good judgment. Her sister reported mild personality changes in that Ms. Fornes' seems more easily irritated while being corrected by others.  Difficulties with emotion regulation: Denied. Difficulties with receptive language: Denied. Difficulties with word finding: Endorsed. Decreased visuoperceptual ability: Denied.   Difficulties completing ADLs: Endorsed. As described above, prominent difficulties were described surrounding financial management and bill paying. Per records, Erin Wall only has one medication which she takes regularly. She reported that she "tries to remember" to take this medication during her last meeting with Dr. Delice Lesch, suggesting some trouble with medication management. She stated that she continues to drive without issue. However, records do suggest that she has gotten lost in the past.   Additional Medical History: History of traumatic brain injury/concussion: Denied. History of stroke: Denied. History of seizure activity:  Denied. History of known exposure to toxins: Denied. Symptoms of chronic pain: Endorsed. She reported ongoing right knee pain due to symptoms of osteoarthritis.  Experience of frequent headaches/migraines: Denied. Frequent instances of dizziness/vertigo: Denied.  Sensory changes: Denied. Balance/coordination difficulties: Denied. She described her balance as "okay." [name] described an instance around a year ago where Ms. Saintil tripped on an exposed nail while walking, leading to a fall and subsequent ongoing knee pain. However, other falls were denied.  Other motor difficulties: Denied.  Sleep History: Estimated hours obtained each night: 8 hours. Difficulties falling asleep: Denied. Difficulties staying asleep: Denied. Feels rested and refreshed upon awakening: Endorsed.  History of snoring: Unknown. History of waking up gasping for air: Denied. Witnessed breath cessation while asleep: Denied.  History of vivid dreaming: Denied. Excessive movement while asleep: Denied. Instances of acting out her dreams: Denied.  Psychiatric/Behavioral Health History: Depression: Denied. Ms. Lanphier described her mood as "it comes and goes," stating that she is pleasant to those who are pleasant to her. She acknowledged that it has been difficult to no longer see her friends often since her retirement. She denied to her knowledge being previously diagnosed with depression. Current or remote suicidal ideation, intent, or plan was denied.  Anxiety: Endorsed. This was endorsed by [name], who reported that her sister is easily flustered/confused and appears anxious throughout the day. Records do suggest prior concerns surrounding generalized anxiety.  Mania: Denied. Trauma History: Denied. Visual/auditory hallucinations: Endorsed. [name] reported that Ms. Sorber has made comments surrounding seeing deceased individuals, as well as children or other individuals in her home. She has previously commented that  their brother is there when he is not. While Ms. Pattison initially did not endorse hallucinations, she did acknowledge them after they were described by her sister. She noted that they do not seem fully realistic, but do appear fully-formed.  Delusional thoughts: Denied.  Tobacco: Denied. Alcohol: She denied current alcohol consumption as well as a history of problematic alcohol abuse or dependence.  Recreational drugs: Denied. Caffeine: Denied.  Family History: Problem Relation Age of Onset  . Hypertension Mother   . Diabetes Mother   . Hypertension Father   . Diabetes Father   . Heart attack Father   . Stroke Father   . Cancer Brother   . Colon cancer Neg Hx   . Colon polyps Neg Hx    This information was confirmed by Erin Wall.  Academic/Vocational History: Highest level of educational attainment: 18 years. Erin Wall earned a Master's degree in elementary education. She described herself as a strong (A/B) student in academic settings. No relative weaknesses were identified.  History of developmental delay: Denied. History of grade repetition: Denied. Enrollment in special education courses: Denied. History of LD/ADHD: Denied.  Employment: Retired. 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   Evaluation Results:   Behavioral Observations: Ms. Demelo was accompanied by her sister [name], arrived to her appointment on time, and was appropriately dressed and groomed. She appeared alert and oriented. Observed gait and station were within normal limits. Gross motor functioning appeared intact upon informal observation and no abnormal movements (e.g., tremors) were noted. Her affect was generally relaxed and positive, but did range appropriately given the subject being discussed during the clinical interview or the task at hand during testing procedures. Spontaneous speech was fluent and word finding difficulties were not observed during the clinical  interview. Thought processes were generally organized and normal in content. She was a fairly limited historian and insight into her cognitive difficulties was poor. She also appeared to have difficulty comprehending interview questions and would occasionally provide responses unrelated to  the question being asked. For example, when asked about prior head injuries, she described right knee pain and a history of alopecia. During testing, sustained attention was adequate. Task engagement was variable. She expressed her lack of desire to complete testing procedures to the psychometrist multiple times throughout the evaluation. She also asked how soon the evaluation would be over after a majority of assessments in the later half of the evaluation. Despite this, she did not discontinue any testing procedures. The evaluation was abbreviated in response. She had some trouble understanding more complex task instructions (TMT B). Overall, Ms. Justiss was cooperative with the clinical interview and subsequent testing procedures.   Adequacy of Effort: The validity of neuropsychological testing is limited by the extent to which the individual being tested may be assumed to have exerted adequate effort during testing. Ms. Bitton expressed her intention to perform to the best of her abilities and exhibited adequate task engagement and persistence. Scores across stand-alone and embedded performance validity measures were within expectation. As such, the results of the current evaluation are believed to be a valid representation of Ms. Fuhr' current cognitive functioning.  Test Results: Ms. Cashaw was disoriented at the time of the current evaluation. She stated an incorrect age ("74"), stated that she resided [place] (this answer was consistently provided several times to ensure that it was her true answer), and was unable to state her correct phone number. She also stated the incorrect year ("2020"),  month, date, day of the week, current time, and current location. When asked why she was here, she responded "I have been wondering that."   Intellectual abilities based upon educational and vocational attainment were estimated to be in the average to above average range. Premorbid abilities were estimated to be within the below average range based upon a single-word reading test.   Processing speed was exceptionally low to below average. Basic attention was above average to well above average. More complex attention (e.g., working memory) was well below average to below average. Assessed executive functioning (i.e., cognitive flexibility) was impaired. Performance was above average on a task assessing safety and judgment.  Assessed receptive language abilities were average. Sentence repetition was below average, while performance on a semantic knowledge screening test was below expectation. Assessed expressive language was variable. Phonemic fluency was well below average, semantic fluency was exceptionally low to well below average, and confrontation naming was average on a screening instrument but exceptionally low across a more comprehensive assessment.     Assessed visuospatial/visuoconstructional abilities were intact across constructional tasks but exceptionally low to well below average across tasks requiring spatial processing and manipulation.    Learning (i.e., encoding) of novel verbal and visual information was exceptionally low to below average. Spontaneous delayed recall (i.e., retrieval) of previously learned information was exceptionally low. Retention rates were 0% across a story learning task, 0% across a list learning task, and 0% across a complex figure drawing task. Performance across recognition tasks was exceptionally low to well below average, suggesting very limited evidence for information consolidation.   Results of emotional screening instruments suggested that recent  symptoms of generalized anxiety were in the minimal range, while symptoms of depression were within normal limits. A screening instrument assessing recent sleep quality suggested the presence of minimal sleep dysfunction.  Tables of Scores:   Note: This summary of test scores accompanies the interpretive report and should not be considered in isolation without reference to the appropriate sections in the text.  Descriptors are based on appropriate normative data and may be adjusted based on clinical judgment. The terms "impaired" and "within normal limits (WNL)" are used when a more specific level of functioning cannot be determined.       Effort Testing:   DESCRIPTOR       Dot Counting Test: --- --- Within Expectation  RBANS Effort Index: --- --- Within Expectation  WAIS-IV Reliable Digit Span: --- --- Within Expectation       Orientation:      Raw Score Percentile   NAB Orientation, Form 1 15/29 --- ---       Cognitive Screening:           Raw Score Percentile   SLUMS: 13/30 --- ---       RBANS, Form A: Standard Score/ Scaled Score Percentile   Total Score 67 1 Exceptionally Low  Immediate Memory 69 2 Exceptionally Low    List Learning 3 1 Exceptionally Low    Story Memory 6 9 Below Average  Visuospatial/Constructional 78 7 Well Below Average    Figure Copy 9 37 Average    Line Orientation 8/20 <2 Exceptionally Low  Language 88 21 Below Average    Picture Naming 9/10 26-50 Average    Semantic Fluency 5 5 Well Below Average  Attention 91 27 Average    Digit Span 15 95 Well Above Average    Coding 2 <1 Exceptionally Low  Delayed Memory 44 <1 Exceptionally Low    List Recall 0/10 <2 Exceptionally Low    List Recognition 13/20 <2 Exceptionally Low    Story Recall 1 <1 Exceptionally Low    Story Recognition 6/12 3-4 Well Below Average    Figure Recall 1 <1 Exceptionally Low    Figure Recognition 1/8 1 Exceptionally Low       Intellectual Functioning:           Standard Score  Percentile   Test of Premorbid Functioning: 87 19 Below Average       Attention/Executive Function:          Trail Making Test (TMT): Raw Score (T Score) Percentile     Part A 61 secs.,  0 errors (37) 9 Below Average    Part B Discontinued --- Impaired         Scaled Score Percentile   WAIS-IV Digit Span: 7 16 Below Average    Forward 12 75 Above Average    Backward 6 9 Below Average    Sequencing 4 2 Well Below Average       NAB Executive Functions Module, Form 1: T Score Percentile     Judgment 58 79 Above Average       Language:           Raw Score Percentile   PPVT Screening Instrument: 12/16 --- Below Expectation  Sentence Repetition: 13/22 11 Below Average       Verbal Fluency Test: Raw Score (T Score) Percentile     Phonemic Fluency (FAS) 23 (35) 7 Well Below Average    Animal Fluency 2 (16) <1 Exceptionally Low        NAB Language Module, Form 2: T Score Percentile     Auditory Comprehension 56 73 Average    Naming 26/31 (25) 1 Exceptionally Low       Visuospatial/Visuoconstruction:      Raw Score Percentile   Clock Drawing: 10/10 --- Within Normal Limits        Scaled Score Percentile   WAIS-IV  Block Design: 5 5 Well Below Average       Mood and Personality:      Raw Score Percentile   Geriatric Depression Scale: 0 --- Within Normal Limits  Geriatric Anxiety Scale: 5 --- Minimal    Somatic 3 --- Minimal    Cognitive 2 --- Minimal    Affective 0 --- Minimal       Additional Questionnaires:      Raw Score Percentile   PROMIS Sleep Disturbance Questionnaire: 14 --- None to Slight   Informed Consent and Coding/Compliance:   Ms. Zaugg was provided with a verbal description of the nature and purpose of the present neuropsychological evaluation. Also reviewed were the foreseeable risks and/or discomforts and benefits of the procedure, limits of confidentiality, and mandatory reporting requirements of this provider. The patient was given the opportunity to  ask questions and receive answers about the evaluation. Oral consent to participate was provided by the patient.   This evaluation was conducted by Christia Reading, Ph.D., licensed clinical neuropsychologist. Ms. Juenger completed a comprehensive clinical interview with Dr. Melvyn Novas, billed as one unit [phone], and 140 minutes of cognitive testing and scoring, billed as one unit [phone] and four additional units [id number]. Psychometrist Cruzita Lederer, B.S., assisted Dr. Melvyn Novas with test administration and scoring procedures. As a separate and discrete service, Dr. Melvyn Novas spent a total of 180 minutes in interpretation and report writing billed as one unit [phone] and two units [id number].

## 2019-08-13 NOTE — Progress Notes (Signed)
   Psychometrician Note   Cognitive testing was administered to Erin Wall by Cruzita Lederer, B.S. (psychometrist) under the supervision of Dr. Christia Reading, Ph.D., licensed psychologist on 08/13/2019. Erin Wall did not appear overtly distressed by the testing session per behavioral observation or responses across self-report questionnaires. Dr. Christia Reading, Ph.D. checked in with Erin Wall as needed to manage any distress related to testing procedures (if applicable). Rest breaks were offered.    The battery of tests administered was selected by Dr. Christia Reading, Ph.D. with consideration to Erin Wall's current level of functioning, the nature of her symptoms, emotional and behavioral responses during interview, level of literacy, observed level of motivation/effort, and the nature of the referral question. This battery was communicated to the psychometrist. Communication between Dr. Christia Reading, Ph.D. and the psychometrist was ongoing throughout the evaluation and Dr. Christia Reading, Ph.D. was immediately accessible at all times. Dr. Christia Reading, Ph.D. provided supervision to the psychometrist on the date of this service to the extent necessary to assure the quality of all services provided.    Erin Wall will return within approximately 1-2 weeks for an interactive feedback session with Dr. Melvyn Novas at which time her test performances, clinical impressions, and treatment recommendations will be reviewed in detail. Erin Wall understands she can contact our office should she require our assistance before this time.  A total of 140 minutes of billable time were spent face-to-face with Erin Wall by the psychometrist. This includes both test administration and scoring time. Billing for these services is reflected in the clinical report generated by Dr. Christia Reading, Ph.D..  This note reflects time spent with the psychometrician and does not include test scores or any  clinical interpretations made by Dr. Melvyn Novas. The full report will follow in a separate note.

## 2019-08-20 ENCOUNTER — Other Ambulatory Visit: Payer: Self-pay

## 2019-08-20 ENCOUNTER — Ambulatory Visit (INDEPENDENT_AMBULATORY_CARE_PROVIDER_SITE_OTHER): Payer: Medicare PPO | Admitting: Psychology

## 2019-08-20 DIAGNOSIS — F0150 Vascular dementia without behavioral disturbance: Secondary | ICD-10-CM

## 2019-08-20 DIAGNOSIS — F0390 Unspecified dementia without behavioral disturbance: Secondary | ICD-10-CM

## 2019-08-20 NOTE — Patient Instructions (Signed)
Recommendations: A future repeat neuropsychological evaluation would be recommended if there is a desire to document further changes over time or if there has been a significant decline in cognitive of functional abilities.  If further diagnostic clarity is desired, Ms. Erin Wall and her family could speak with Dr. Delice Lesch about completing an PET scan. However, it is unlikely that the results of this scan would dramatically alter treatment moving forward.   Should there be a progression of her current deficits over time, Ms. Erin Wall is unlikely to regain any independent living skills lost. Therefore, it is recommended that she remain as involved as possible in all aspects of household chores, finances, and medication management, with supervision to ensure adequate performance. She will likely benefit from the establishment and maintenance of a routine in order to maximize her functional abilities over time.  It will be important for Ms. Erin Wall to have another person with her when in situations where she may need to process information, weigh the pros and cons of different options, and make decisions, in order to ensure that she fully understands and recalls all information to be considered.  If not already done, Ms. Erin Wall and her family may want to discuss her wishes regarding durable power of attorney and medical decision making, so that she can have input into these choices. Additionally, they may wish to discuss future plans for caretaking and seek out community options for in home/residential care should they become necessary. During the current evaluation, she performed well on a task assessing safety and judgment. However, these abilities are expected to decline over time with the presence of a neurodegenerative illness.   Performance across neurocognitive testing is not a strong predictor of an individual's safety operating a motor vehicle. However, Ms. Erin Wall did demonstrate significant cognitive  dysfunction, especially across processing speed, cognitive flexibility, and visuospatial functioning. I would recommend that she not continue to drive pending a formal driving evaluation. Should her family wish to pursue a formalized driving evaluation, they would be encouraged to [employer] [facility] in [place], [place] at [phone].Another option would be through [facility]; however, the latter would likely require a referral from a medical doctor. [facility] can be reached directly at [phone].  Important information to be remembered should be provided in written format in all instances. This should be placed in a highly visible and commonly frequented area of her residence to promote recall.   To address problems with processing speed, she may wish to consider:   -Ensuring that she is alerted when essential material or instructions are being presented   -Adjusting the speed at which new information is presented   -Allowing for more time in comprehending, processing, and responding in conversation  To address problems with working memory and executive dysfunction, she may wish to consider:   -Avoiding external distractions when needing to concentrate   -Limiting exposure to fast paced environments with multiple sensory demands   -Writing down complicated information and using checklists   -Attempting and completing one task at a time (i.e., no multi-tasking)   -Verbalizing aloud each step of a task to maintain focus   -Reducing the amount of information considered at one time

## 2019-08-20 NOTE — Progress Notes (Signed)
° °  Neuropsychology Feedback Session Tillie Rung. [facility] Department of Neurology  Reason for Referral:   Erin Wall a 74 y.o. right-handed African-American female referred by Ellouise Newer, M.D.,to characterize hercurrent cognitive functioning and assist with diagnostic clarity and treatment planning in the context of subjective cognitive decline and concerns for a neurodegenerative illness.   Feedback:   Erin Wall completed a comprehensive neuropsychological evaluation on 08/13/2019. Please refer to that encounter for the full report and recommendations. Briefly, results suggested fairly diffuse cognitive impairment with most prominent areas of weakness surrounding learning and memory, cognitive flexibility, and expressive language. Additional weaknesses were exhibited across processing speed, working memory, and spatial tasks requiring spatial processing and/or manipulation. Specific to memory, Erin Wall had pronounced difficulty retrieving previously learned information and retention percentages were 0% across all utilized memory measures. She also performed poorly across recognition trials, raising concerns for a rapid forgetting memory profile. This, coupled with deficits in confrontation naming, a noted discrepancy between phonemic and semantic fluency scores, and deficits in visuospatial abilities all raise concerns for the presence of Alzheimer's disease. Overall, I feel that this represents the most likely etiology at the present time.  Erin Wall was accompanied by her sister Erin Wall during the current telephone conference call. They were within their respective residences while I was within my office. Content of the current session focused on the results of her neuropsychological evaluation. Erin Wall and her sister were given the opportunity to ask questions and their questions were answered. Erin Wall expressed concerns that observed cognitive deficits were  due to her having a history of wearing her wig caps too tight rather than the presence of any neurological condition. It was communicated that this explanation was quite unlikely to be true. They were encouraged to reach out should additional questions arise. A copy of her report was mailed at the conclusion of the visit.      15 minutes were spent conducting the current feedback session with Erin Wall.

## 2019-09-15 ENCOUNTER — Encounter (HOSPITAL_COMMUNITY): Payer: Self-pay | Admitting: *Deleted

## 2019-09-15 ENCOUNTER — Telehealth: Payer: Self-pay | Admitting: Family Medicine

## 2019-09-15 ENCOUNTER — Other Ambulatory Visit: Payer: Self-pay

## 2019-09-15 ENCOUNTER — Emergency Department (HOSPITAL_COMMUNITY)
Admission: EM | Admit: 2019-09-15 | Discharge: 2019-09-16 | Disposition: A | Discharge status: Home / Self Care | Payer: Medicare PPO | Attending: Emergency Medicine | Admitting: Emergency Medicine

## 2019-09-15 DIAGNOSIS — T782XXA Anaphylactic shock, unspecified, initial encounter: Secondary | ICD-10-CM | POA: Insufficient documentation

## 2019-09-15 DIAGNOSIS — I82402 Acute embolism and thrombosis of unspecified deep veins of left lower extremity: Secondary | ICD-10-CM | POA: Diagnosis not present

## 2019-09-15 DIAGNOSIS — F0390 Unspecified dementia without behavioral disturbance: Secondary | ICD-10-CM | POA: Diagnosis not present

## 2019-09-15 DIAGNOSIS — R21 Rash and other nonspecific skin eruption: Secondary | ICD-10-CM | POA: Diagnosis not present

## 2019-09-15 DIAGNOSIS — Z79899 Other long term (current) drug therapy: Secondary | ICD-10-CM | POA: Insufficient documentation

## 2019-09-15 DIAGNOSIS — R220 Localized swelling, mass and lump, head: Secondary | ICD-10-CM | POA: Insufficient documentation

## 2019-09-15 DIAGNOSIS — I10 Essential (primary) hypertension: Secondary | ICD-10-CM | POA: Insufficient documentation

## 2019-09-15 DIAGNOSIS — T7840XA Allergy, unspecified, initial encounter: Secondary | ICD-10-CM | POA: Diagnosis present

## 2019-09-15 MED ORDER — PREDNISONE 20 MG PO TABS: 40.0000 mg | ORAL_TABLET | Freq: Every day | ORAL | 0 refills | Status: DC

## 2019-09-15 MED ORDER — FAMOTIDINE IN NACL 20-0.9 MG/50ML-% IV SOLN
20.0000 mg | Freq: Once | INTRAVENOUS | Status: AC
Start: 2019-09-15 — End: 2019-09-15
  Administered 2019-09-15: 20 mg via INTRAVENOUS
  Filled 2019-09-15: qty 50

## 2019-09-15 MED ORDER — EPINEPHRINE 0.3 MG/0.3ML IJ SOAJ
0.3000 mg | Freq: Once | INTRAMUSCULAR | Status: AC
  Administered 2019-09-15: 0.3 mg via INTRAMUSCULAR
  Filled 2019-09-15: qty 0.3

## 2019-09-15 MED ORDER — METHYLPREDNISOLONE SODIUM SUCC 125 MG IJ SOLR
125.0000 mg | Freq: Once | INTRAMUSCULAR | Status: AC
Start: 2019-09-15 — End: 2019-09-15
  Administered 2019-09-15: 125 mg via INTRAVENOUS
  Filled 2019-09-15: qty 2

## 2019-09-15 MED ORDER — EPINEPHRINE 0.3 MG/0.3ML IJ SOAJ: 0.3000 mg | INTRAMUSCULAR | 0 refills | Status: AC | PRN

## 2019-09-15 MED ORDER — DIPHENHYDRAMINE HCL 50 MG/ML IJ SOLN
25.0000 mg | Freq: Once | INTRAMUSCULAR | Status: AC
Start: 2019-09-15 — End: 2019-09-15
  Administered 2019-09-15: 25 mg via INTRAVENOUS
  Filled 2019-09-15: qty 1

## 2019-09-15 NOTE — ED Provider Notes (Signed)
[facility] EMERGENCY DEPARTMENT Provider Note   CSN: [id number] Arrival date & time: 09/15/19  1539     History Chief Complaint  Patient presents with  . Allergic Reaction    Erin Wall is a 74 y.o. female. Presents to ER with concern for possible allergic reaction. Patient reports that she had fish for dinner last night, this morning when she woke up she noted some swelling of her lips, throughout the day has had worsening rash, itching in her legs primarily. No nausea or vomiting, no difficulty in breathing. Lip swelling initially just on lower lip but now is primarily on upper lip. She denies any history of hypertension or use of antihypertensives such as lisinopril or other ACE inhibitor. Denies family history of angioedema. Has history of mild dementia on Aricept. Patient also reports that she is noted some swelling in her left leg, seems to come and go at random, ongoing for the past 2 weeks or so. Denies prior history of DVT/PE, not having any pain in her legs.  HPI     Past Medical History:  Diagnosis Date  . Arthritis 05/09/2012  . Borderline diabetes   . Generalized anxiety disorder   . GERD (gastroesophageal reflux disease)   . Hematuria 12/07/2014  . Hemorrhoids   . Hyperlipemia   . Hyperlipidemia 05/09/2012  . Hypertension   . Impaired fasting glucose 11/15/2012  . Lung nodules   . Lymphocytosis 05/09/2012  . Major neurocognitive disorder due to Alzheimer's disease, possible 08/13/2019  . Postmenopausal 12/07/2014  . Tendonitis   . Unilateral primary osteoarthritis, right knee 01/17/2019  . Urinary frequency 12/07/2014    Patient Active Problem List   Diagnosis Date Noted  . Major neurocognitive disorder due to Alzheimer's disease, possible 08/13/2019  . Unilateral primary osteoarthritis, right knee 01/17/2019  . Urinary frequency 12/07/2014  . Hematuria 12/07/2014  . Postmenopausal 12/07/2014  . Constipation 12/02/2012  . Unspecified hereditary and  idiopathic peripheral neuropathy 11/15/2012  . Impaired fasting glucose 11/15/2012  . Arthritis 05/09/2012  . Hyperlipidemia 05/09/2012  . Lymphocytosis 05/09/2012  . Hypertension 05/09/2012  . Weight loss 08/24/2010  . Flatulence, eructation, and gas pain 10/13/2009  . Abdominal pain, epigastric 08/28/2008    Past Surgical History:  Procedure Laterality Date  . ABDOMINAL HYSTERECTOMY    . BACTERIAL OVERGROWTH TEST  01/01/2012   Procedure: BACTERIAL OVERGROWTH TEST;  Surgeon: Danie Binder, MD;  Location: [facility];  Service: Endoscopy;  Laterality: N/A;  7:30  . BREAST BIOPSY    . BREAST EXCISIONAL BIOPSY Left 1978   benign  . BREAST SURGERY     tumor removed  . COLONOSCOPY  2004  . COLONOSCOPY  09/20/2010   VVO:HYWVPXTG polyps,internal hemorrhoids/polypoid lesion in the cecum  . ESOPHAGOGASTRODUODENOSCOPY  09/20/2010   GYI:RSWNIOEVO/JJKK gastritis     OB History    Gravida  0   Para  0   Term  0   Preterm  0   AB  0   Living  0     SAB  0   TAB  0   Ectopic  0   Multiple  0   Live Births              Family History  Problem Relation Age of Onset  . Hypertension Mother   . Diabetes Mother   . Hypertension Father   . Diabetes Father   . Heart attack Father   . Stroke Father   . Cancer Brother   .  Colon cancer Neg Hx   . Colon polyps Neg Hx     Social History   Tobacco Use  . Smoking status: Never Smoker  . Smokeless tobacco: Never Used  Vaping Use  . Vaping Use: Never used  Substance Use Topics  . Alcohol use: No  . Drug use: No    Home Medications Prior to Admission medications   Medication Sig Start Date End Date Taking? Authorizing Provider  donepezil (ARICEPT) 10 MG tablet Take 1 tablet every night 07/09/19   Cameron Sprang, MD    Allergies    Pravachol  Review of Systems   Review of Systems  Constitutional: Negative for chills and fever.  HENT: Positive for facial swelling. Negative for ear pain and sore throat.    Eyes: Negative for pain and visual disturbance.  Respiratory: Negative for cough and shortness of breath.   Cardiovascular: Negative for chest pain and palpitations.  Gastrointestinal: Negative for abdominal pain and vomiting.  Genitourinary: Negative for dysuria and hematuria.  Musculoskeletal: Negative for arthralgias and back pain.  Skin: Positive for rash. Negative for color change.  Neurological: Negative for seizures and syncope.  All other systems reviewed and are negative.   Physical Exam Updated Vital Signs BP 139/67 (BP Location: Right Arm)   Pulse 72   Temp 98.2 F (36.8 C) (Oral)   Resp 18   Ht 5' 11.5" (1.816 m)   Wt 80.9 kg   SpO2 100%   BMI 24.52 kg/m   Physical Exam Vitals and nursing note reviewed.  Constitutional:      General: She is not in acute distress.    Appearance: She is well-developed.  HENT:     Head: Normocephalic and atraumatic.     Mouth/Throat:     Comments: Significant upper lip swelling, worse on left side Oropharynx is otherwise clear, no tongue swelling or posterior oropharynx swelling Eyes:     Conjunctiva/sclera: Conjunctivae normal.  Cardiovascular:     Rate and Rhythm: Normal rate and regular rhythm.     Heart sounds: No murmur heard.   Pulmonary:     Effort: Pulmonary effort is normal. No respiratory distress.     Breath sounds: Normal breath sounds.  Abdominal:     Palpations: Abdomen is soft.     Tenderness: There is no abdominal tenderness.  Musculoskeletal:        General: No deformity or signs of injury.     Cervical back: Neck supple.     Comments: No significant edema noted in lower legs, no tenderness to palpation in lower extremities  Skin:    General: Skin is warm and dry.     Comments: Urticarial rash over bilateral anterior upper legs, slightly raised blanchable erythema with overlying excoriations  Neurological:     General: No focal deficit present.     Mental Status: She is alert and oriented to person,  place, and time.  Psychiatric:        Mood and Affect: Mood normal.        Behavior: Behavior normal.     ED Results / Procedures / Treatments   Labs (all labs ordered are listed, but only abnormal results are displayed) Labs Reviewed - No data to display  EKG None  Radiology No results found.  Procedures Procedures (including critical care time)  Medications Ordered in ED Medications - No data to display  ED Course  I have reviewed the triage vital signs and the nursing notes.  Pertinent labs &  imaging results that were available during my care of the patient were reviewed by me and considered in my medical decision making (see chart for details).  Clinical Course as of Sep 14 2345  Mon Sep 15, 2019  2328 Rechecked, signed out to [place]   [RD]    Clinical Course User Index [RD] Lucrezia Starch, MD   MDM Rules/Calculators/A&P                          74 year old lady presents to ER with concern for allergic reaction. On exam noted swelling to upper lip as well as rash to legs. No angioedema family hx, no use of ACEi. Given multiple systems involved, facial swelling, concern for possible anaphylaxis, proceeded with epi, steroids, Benadryl, Pepcid. After receiving his medications, patient had significant improvement in her symptoms though she still had some ongoing upper lip swelling. Will keep in ER for 3 to 4 hours after her dose of epinephrine to make sure patient does not have worsening of her symptoms again. If continues to improve, anticipate discharge home. Will give rx for epi pen and steroids. Regarding her left leg swelling, will recommend patient come back tomorrow am for [place] to r/o dvt but otherwise, leg looks well, normal joint ROM, and would refer back to PCP.   Refer to [facility]'s note for final plan/dispo pending further observation.   Final Clinical Impression(s) / ED Diagnoses Final diagnoses:  Anaphylaxis, initial encounter    Rx / DC Orders ED  Discharge Orders    None       Lucrezia Starch, MD 09/15/19 2354

## 2019-09-15 NOTE — Discharge Instructions (Signed)
Take Benadryl as needed for itching.  Recommend taking steroid burst as prescribed.  Use EpiPen as needed for anaphylaxis.  If you ever have to use your EpiPen, develop worsening lip swelling, tongue swelling, throat swelling, difficulty breathing, vomiting or other new concerning symptom, please return to ER for reassessment.  Recommend obtaining the ultrasound of your leg to rule out a blood clot.   For all of these symptoms, please schedule a close outpatient follow up with your primary. Ideally to be seen within 24-48 hours.

## 2019-09-15 NOTE — Telephone Encounter (Signed)
Sister called back and stated she just saw her sister and her lips are swollen. Advised sister to take patient to ER for evaluation and treatment. Sister agreed.

## 2019-09-15 NOTE — Telephone Encounter (Signed)
Sister calling in because she said one foot has been swelling off and on for days.  She thought it was because she was wearing tight socks and rolling them down.  Having some itching also.  No appts available.

## 2019-09-15 NOTE — ED Triage Notes (Signed)
C/o swelling to left ankle onset yesterday, swelling of lips onset today, no difficulty breathing on arrival

## 2019-09-15 NOTE — Telephone Encounter (Signed)
Sister called back to check on message, scheduled pt appt with Santiago Glad for Wednesday 10:00am

## 2019-09-15 NOTE — Telephone Encounter (Signed)
Sister states her ankle and foot have been swollen for 2 weeks- patient able to walk on it fine with no complaints other than itching - sits outside a lot so not sure if anything bit her

## 2019-09-15 NOTE — Telephone Encounter (Signed)
To have appt with Dr Nicki Reaper or Santiago Glad within 48 hours

## 2019-09-16 NOTE — ED Notes (Signed)
Pt says she is feeling better, lip has decreased in size, whelps have diminished. Dr Wyvonnia Dusky aware.

## 2019-09-16 NOTE — ED Provider Notes (Addendum)
Care assumed from Dr. Roslynn Amble.  Patient seen and possible allergic reaction involving her lips and itching in her legs.  She received IM epinephrine around 10 PM.  No chest pain or shortness of breath.  No recent use of ACE inhibitor.  Reassessment at 2 AM.  Patient and family state the lip swelling is much improved.  There is no tongue or posterior pharynx swelling.  No chest pain or shortness of breath.  Observed for 4 hours status post epinephrine injection without deterioration.  We will continue care at home with steroids and antihistamines.  EpiPen for home discussed with patient and family member. Indications for use discussed Return precautions discussed.  CRITICAL CARE Performed by: Ezequiel Essex Total critical care time: 35 minutes Critical care time was exclusive of separately billable procedures and treating other patients. Critical care was necessary to treat or prevent imminent or life-threatening deterioration. Critical care was time spent personally by me on the following activities: development of treatment plan with patient and/or surrogate as well as nursing, discussions with consultants, evaluation of patient's response to treatment, examination of patient, obtaining history from patient or surrogate, ordering and performing treatments and interventions, ordering and review of laboratory studies, ordering and review of radiographic studies, pulse oximetry and re-evaluation of patient's condition.    Ezequiel Essex, MD 09/16/19 [id number]    Ezequiel Essex, MD 09/16/19 [phone]

## 2019-09-17 ENCOUNTER — Other Ambulatory Visit (HOSPITAL_COMMUNITY)
Admission: RE | Admit: 2019-09-17 | Discharge: 2019-09-17 | Disposition: A | Discharge status: Home / Self Care | Payer: Medicare PPO | Source: Ambulatory Visit | Attending: Family Medicine | Admitting: Family Medicine

## 2019-09-17 ENCOUNTER — Encounter: Payer: Self-pay | Admitting: Family Medicine

## 2019-09-17 ENCOUNTER — Ambulatory Visit (HOSPITAL_COMMUNITY)
Admission: RE | Admit: 2019-09-17 | Discharge: 2019-09-17 | Disposition: A | Discharge status: Home / Self Care | Payer: Medicare PPO | Source: Ambulatory Visit | Attending: Family Medicine | Admitting: Family Medicine

## 2019-09-17 ENCOUNTER — Ambulatory Visit (INDEPENDENT_AMBULATORY_CARE_PROVIDER_SITE_OTHER): Payer: Medicare PPO | Admitting: Family Medicine

## 2019-09-17 ENCOUNTER — Telehealth: Payer: Self-pay | Admitting: *Deleted

## 2019-09-17 ENCOUNTER — Other Ambulatory Visit: Payer: Self-pay

## 2019-09-17 VITALS — BP 118/74 | HR 104 | Temp 97.5°F | Ht 71.5 in | Wt 178.4 lb

## 2019-09-17 DIAGNOSIS — L509 Urticaria, unspecified: Secondary | ICD-10-CM | POA: Diagnosis not present

## 2019-09-17 DIAGNOSIS — M79662 Pain in left lower leg: Secondary | ICD-10-CM | POA: Diagnosis not present

## 2019-09-17 DIAGNOSIS — R609 Edema, unspecified: Secondary | ICD-10-CM

## 2019-09-17 LAB — D-DIMER, QUANTITATIVE: D-Dimer, Quant: >20 ug/mL-FEU — ABNORMAL HIGH (ref 0.00–0.50)

## 2019-09-17 NOTE — Progress Notes (Signed)
Patient ID: Erin Wall, female    DOB: [birth year], 74 y.o.   MRN: [id number]   Chief Complaint  Patient presents with   Ankle Pain   Subjective:    HPI  pain and swelling in left ankle for 2 weeks. Has not tried any treatments. Having pain in calf of left leg.   2 days ago the patient had an allergic reaction with lip swelling and hives and went to the ED. Due to insurance card challenges has not had the opportunity to get the prednisone that was prescribed. Today hives are returning to upper and lower extremities.   Medical History Erin Wall has a past medical history of Arthritis (05/09/2012), Borderline diabetes, Generalized anxiety disorder, GERD (gastroesophageal reflux disease), Hematuria (12/07/2014), Hemorrhoids, Hyperlipemia, Hyperlipidemia (05/09/2012), Hypertension, Impaired fasting glucose (11/15/2012), Lung nodules, Lymphocytosis (05/09/2012), Major neurocognitive disorder due to Alzheimer's disease, possible (08/13/2019), Postmenopausal (12/07/2014), Tendonitis, Unilateral primary osteoarthritis, right knee (01/17/2019), and Urinary frequency (12/07/2014).   Outpatient Encounter Medications as of 09/17/2019  Medication Sig   donepezil (ARICEPT) 10 MG tablet Take 1 tablet every night   EPINEPHrine 0.3 mg/0.3 mL IJ SOAJ injection Inject 0.3 mLs (0.3 mg total) into the muscle as needed for anaphylaxis.   predniSONE (DELTASONE) 20 MG tablet Take 2 tablets (40 mg total) by mouth daily for 5 days.   No facility-administered encounter medications on file as of 09/17/2019.     Review of Systems  Constitutional: Negative for chills and fever.  HENT: Negative.   Eyes: Negative.   Respiratory: Negative.   Cardiovascular: Positive for leg swelling.       Left leg and foot edema noted (present for past two weeks).  Gastrointestinal: Negative.   Endocrine: Negative.   Genitourinary: Negative.   Musculoskeletal: Negative.   Skin: Positive for rash.       Red raised hives noted  on arms and legs  Allergic/Immunologic:       Unknown what caused allergic reaction on Monday. Had eaten catfish prior to reaction.  Neurological: Negative for dizziness, facial asymmetry and speech difficulty.  Hematological: Negative.   Psychiatric/Behavioral: Negative.      Vitals BP 118/74    Pulse (!) 104    Temp (!) 97.5 F (36.4 C)    Ht 5' 11.5" (1.816 m)    Wt 178 lb 6.4 oz (80.9 kg)    SpO2 94%    BMI 24.53 kg/m   Objective:   Physical Exam Constitutional:      Appearance: Normal appearance.  Cardiovascular:     Rate and Rhythm: Normal rate and regular rhythm.     Pulses: Normal pulses.     Heart sounds: Normal heart sounds.     Comments: Left calf and ankle edema. Tender to touch when palpating pulses. Pulmonary:     Effort: Pulmonary effort is normal.     Breath sounds: Normal breath sounds.  Musculoskeletal:        General: Swelling present.     Comments: Left lower extremity (calf and ankle and foot).  Skin:    General: Skin is warm and dry.     Findings: Rash present.     Comments: Red raised hives upper and lower extremities.  Neurological:     General: No focal deficit present.     Mental Status: She is alert. Mental status is at baseline.  Psychiatric:        Behavior: Behavior normal.      Assessment and Plan   1. Pain  of left calf - US Venous Img Lower Unilateral Left (DVT) - D-dimer, quantitative (not at [facility])  2. Peripheral edema - US Venous Img Lower Unilateral Left (DVT) - D-dimer, quantitative (not at [facility])  3. Hives   Hives from allergic reaction that started on Monday, 09/15/19 Benadryl 50 mg by mouth given in office.   Patient presents today with Erin Wall, sister,  Phone # [phone]  with concerns of left calf/ankle/foot swelling. On Monday, patient had an allergic reaction and received care at the Emergency department. Has not started prescribed prednisone at this point and hives have returned. NO swelling noted at mouth or lips.    Stat d-dimer and Ultrasound ordered to rule-out left lower extremity DVT. Instructions given on next steps.  1) Sister is taking patient to [facility] immediately to get prednisone script (water given for patient to take- this immediately).   2) After prednisone has been given, sister will take patient to [facility] for stat labs and U/S. (this should only be minutes for these events to occur).   Will call with results and next steps once lab results are available today. Sister and patient understand the importance of getting prednisone in patient immediately and then going to [facility] for further testing on left leg. Sister and patient agree with plan of care and understand that next steps will be discussed once results of test are available. Also understands importance of getting epi pen as soon as possible. Hives present today without any swelling or difficulty breathing while in the office.   Follow-up later today with results.    Pecolia Ades, NP  09/17/2019-

## 2019-09-17 NOTE — Telephone Encounter (Signed)
The patient has a superficial blood clot Currently does not require blood thinning I would like to recheck this patient Friday morning If no available slots may be 1130

## 2019-09-17 NOTE — Telephone Encounter (Signed)
Pt notified and appt made for Friday at 10:30

## 2019-09-17 NOTE — Telephone Encounter (Signed)
Stat u/s and d dimer results in epic for review.  Call sister [name]. ( was signing dpr on the way out of office today)

## 2019-09-17 NOTE — Patient Instructions (Signed)
Go to pharmacy and get prednisone right away before you go to get labs and U/S to rule out DVT.

## 2019-09-19 ENCOUNTER — Other Ambulatory Visit: Payer: Self-pay

## 2019-09-19 ENCOUNTER — Ambulatory Visit: Payer: Medicare PPO | Admitting: Family Medicine

## 2019-09-19 ENCOUNTER — Encounter: Payer: Self-pay | Admitting: Family Medicine

## 2019-09-19 VITALS — BP 138/78 | Temp 97.4°F | Wt 182.2 lb

## 2019-09-19 DIAGNOSIS — T7840XD Allergy, unspecified, subsequent encounter: Secondary | ICD-10-CM

## 2019-09-19 DIAGNOSIS — Z91018 Allergy to other foods: Secondary | ICD-10-CM

## 2019-09-19 DIAGNOSIS — I8392 Asymptomatic varicose veins of left lower extremity: Secondary | ICD-10-CM

## 2019-09-19 MED ORDER — PREDNISONE 20 MG PO TABS: ORAL_TABLET | ORAL | 0 refills | Status: DC

## 2019-09-19 NOTE — Progress Notes (Signed)
   Subjective:    Patient ID: Erin Wall, female    DOB: 1945/02/27, 74 y.o.   MRN: [id number]  HPI Patient comes in today for follow up on left lower calf pain and positive d-dimer.  She relates some soreness in the left lower leg she does have some surface varicose veins D-dimer was positive the ultrasound showed superficial blood clot  Patient still reports severe itching after starting prednisone and using benadryl. So plan is she had hives on several different areas some on the lower legs some on the arms and also had swelling of the lower lip and she had seafood recently but in the past she has been able to have seafood without trouble  Review of Systems Please see above   Denies chest tightness pressure pain shortness of breath Objective:   Physical Exam Lungs clear respiratory rate normal heart regular no murmurs the left calf there is some superficial tenderness but no deep tenderness  No hives are noted currently     Assessment & Plan:  Left calf pain Thrombosed varicose vein Due to acute nature of this need to do a Doppler study in 5 days to make sure it is not extending into the deep vein system  Allergic reaction recommend referral to allergist for further evaluation prednisone taper as directed it could be related to something they have taken in orally

## 2019-09-29 ENCOUNTER — Ambulatory Visit (HOSPITAL_COMMUNITY): Payer: Medicare PPO

## 2019-09-30 ENCOUNTER — Ambulatory Visit (HOSPITAL_COMMUNITY)
Admission: RE | Admit: 2019-09-30 | Discharge: 2019-09-30 | Disposition: A | Discharge status: Home / Self Care | Payer: Medicare PPO | Source: Ambulatory Visit | Attending: Family Medicine | Admitting: Family Medicine

## 2019-09-30 ENCOUNTER — Ambulatory Visit (INDEPENDENT_AMBULATORY_CARE_PROVIDER_SITE_OTHER): Payer: Medicare PPO | Admitting: Family Medicine

## 2019-09-30 ENCOUNTER — Other Ambulatory Visit: Payer: Self-pay

## 2019-09-30 DIAGNOSIS — I824Y2 Acute embolism and thrombosis of unspecified deep veins of left proximal lower extremity: Secondary | ICD-10-CM | POA: Insufficient documentation

## 2019-09-30 DIAGNOSIS — I8392 Asymptomatic varicose veins of left lower extremity: Secondary | ICD-10-CM | POA: Insufficient documentation

## 2019-09-30 MED ORDER — RIVAROXABAN (XARELTO) VTE STARTER PACK (15 & 20 MG): ORAL_TABLET | ORAL | 0 refills | Status: DC

## 2019-09-30 NOTE — Progress Notes (Signed)
   Subjective:    Patient ID: Erin Wall, female    DOB: 01-31-1946, 74 y.o.   MRN: [id number]  HPI  This patient presents because abnormal ultrasound She had some left calf soreness as well as superficial blood clot Positive D-dimer We ordered additional testing for one reason or another she did not get this completed until now She relates some swelling in the lower leg.  Some soreness as well.  Denies any major setbacks no chest tightness pressure pain or shortness of breath  Review of Systems Please see above    Objective:   Physical Exam Lungs clear respiratory rate normal heart regular she does have some calf swelling on the left side minimal tenderness  Doppler study shows DVT     Assessment & Plan:  Left leg DVT Recommend Xarelto starter pack.  Then transition to 20 mg daily.  Avoid all anti-inflammatories May use Tylenol only.  Notify us if any problems with the medication There is some difficulties because patient does have mild dementia but her sister helps her with her medicines Patient should do a follow-up in 1 month and we will more than likely continue the anticoagulant for 4 to 6 months. Because this is unprovoked DVT will be doing Doppler study and D-dimer before discontinuing Xarelto Patient may well benefit from further evaluation with hypercoagulability studies when finishing Xarelto

## 2019-10-03 ENCOUNTER — Ambulatory Visit: Payer: Medicare PPO | Admitting: Family Medicine

## 2019-10-28 ENCOUNTER — Other Ambulatory Visit: Payer: Self-pay

## 2019-10-28 ENCOUNTER — Encounter: Payer: Self-pay | Admitting: Family Medicine

## 2019-10-28 ENCOUNTER — Telehealth: Payer: Self-pay | Admitting: Neurology

## 2019-10-28 ENCOUNTER — Ambulatory Visit: Payer: Medicare PPO | Admitting: Family Medicine

## 2019-10-28 VITALS — BP 130/78 | HR 68 | Temp 94.8°F | Wt 184.8 lb

## 2019-10-28 DIAGNOSIS — I824Y2 Acute embolism and thrombosis of unspecified deep veins of left proximal lower extremity: Secondary | ICD-10-CM

## 2019-10-28 MED ORDER — RIVAROXABAN 20 MG PO TABS: 20.0000 mg | ORAL_TABLET | Freq: Every day | ORAL | 3 refills | Status: DC

## 2019-10-28 NOTE — Telephone Encounter (Signed)
Pt is at PCP office now. Pt sister stated that she doesn't think Donepezil is working any more, pt ask the same questions over and over, she will hang up and call over and over forgetting she has called and talk to family, she is having a hard time following instructions, pt still lives at home alone, at times she thinks her home is work or some where else and will call family asking to go home, she doesn't try to leave on her own,

## 2019-10-28 NOTE — Telephone Encounter (Signed)
Patient sister called and states she needs to speak to someone about her medication donepezil. It is not working and is bothering her stomach  please call

## 2019-10-28 NOTE — Progress Notes (Signed)
   Subjective:    Patient ID: Erin Wall, female    DOB: 03-May-1945, 74 y.o.   MRN: [id number]  HPI Pt here for 2 week follow up. Pt has been taking Xaralto. Pt has 4 more days worth. 2 CT done on left leg for blood clot. Pt ankles are still swollen. No warmth, pain or tenderness in legs  Denies any chest pain denies chest tightness pressure pain shortness of breath denies leg pain has history of DVT  Review of Systems    See above Objective:   Physical Exam  Lungs clear respiratory rate normal heart is regular pulse normal there is some edema on the left lower leg there is no tenderness      Assessment & Plan:  DVT family is making sure she is taking her medication Continue Xarelto 1 daily at least for the next 3 to 4 months follow-up in 4 months at that time if doing well more than likely stop Xarelto  Knee-high support stockings recommended  Family states they will be following up with neurology soon for dementia  Follow-up with Dr. Lovena Le primary care in approximately 3 to 4 months

## 2019-10-28 NOTE — Telephone Encounter (Signed)
It sounds like she is not safe living home alone. If family agrees, recommend looking into assisted living facilities or having someone stay with her. Stop the Donepezil and call our office for an update in a week, if stomach upset is better, we will plan to start a different medication. The medications for dementia can only help slow down worsening of memory but the memory will still progressively worsen. Thanks

## 2019-10-29 ENCOUNTER — Telehealth: Payer: Self-pay | Admitting: Neurology

## 2019-10-29 NOTE — Telephone Encounter (Signed)
Patient's sister called back regarding note yesterday.

## 2019-10-29 NOTE — Telephone Encounter (Signed)
Spoke with pt sister she stated that pt does not want anyone living with her at this time, they will talk about it more. Pt will stop Aricept and call the office back in a week for an update,

## 2019-10-29 NOTE — Telephone Encounter (Signed)
See other phone note

## 2019-11-05 ENCOUNTER — Ambulatory Visit: Payer: Medicare PPO | Admitting: Allergy & Immunology

## 2019-11-07 ENCOUNTER — Telehealth: Payer: Self-pay | Admitting: Neurology

## 2019-11-07 NOTE — Telephone Encounter (Signed)
Pt sister stated that she is doing about the same, asking what new medication you would like to start for mrs Kimmons

## 2019-11-07 NOTE — Telephone Encounter (Signed)
Patient's sister [name] called to check in with the nurse after stopping a medication. She said it's been a week and she needs to speak with a nurse to give an update so the patient can start a new medication.  [facility] [place], [place]

## 2019-11-10 MED ORDER — RIVASTIGMINE TARTRATE 1.5 MG PO CAPS: 1.5000 mg | ORAL_CAPSULE | Freq: Two times a day (BID) | ORAL | 11 refills | Status: DC

## 2019-11-10 NOTE — Telephone Encounter (Signed)
Pls let her know that all the medications for dementia are not cures, memory will worsen over time, the medications just help slow down the worsening but will not make her remember things better. Let's start Rivastigmine 1.5mg  twice a day, side effects can still be stomach upset, but usually happens less than the first medicine she tried. I'll send in Rx, thanks

## 2019-11-10 NOTE — Telephone Encounter (Signed)
Pt sister called no answer unable to leave a voice mail

## 2019-11-11 NOTE — Telephone Encounter (Signed)
Pt sister called no answer unable to leave a voice mail

## 2019-11-14 NOTE — Telephone Encounter (Signed)
Pt sister called no answer unable to leave a voice mail.  Closing out phone note pt called 3 times no answer.

## 2019-12-04 ENCOUNTER — Other Ambulatory Visit: Payer: Self-pay | Admitting: Family Medicine

## 2019-12-17 ENCOUNTER — Ambulatory Visit: Payer: Medicare PPO | Admitting: Allergy & Immunology

## 2020-02-16 ENCOUNTER — Other Ambulatory Visit: Payer: Self-pay

## 2020-02-16 ENCOUNTER — Encounter: Payer: Self-pay | Admitting: Neurology

## 2020-02-16 ENCOUNTER — Telehealth (INDEPENDENT_AMBULATORY_CARE_PROVIDER_SITE_OTHER): Payer: Medicare PPO | Admitting: Neurology

## 2020-02-16 VITALS — Ht 71.5 in | Wt 184.0 lb

## 2020-02-16 DIAGNOSIS — G301 Alzheimer's disease with late onset: Secondary | ICD-10-CM | POA: Diagnosis not present

## 2020-02-16 DIAGNOSIS — F0281 Dementia in other diseases classified elsewhere with behavioral disturbance: Secondary | ICD-10-CM

## 2020-02-16 MED ORDER — MIRTAZAPINE 15 MG PO TABS: 15.0000 mg | ORAL_TABLET | Freq: Every day | ORAL | 11 refills | Status: DC

## 2020-02-16 NOTE — Progress Notes (Signed)
Virtual Visit via Video Note The purpose of this virtual visit is to provide medical care while limiting exposure to the novel coronavirus.    Consent was obtained for video visit:  Yes.   Answered questions that patient had about telehealth interaction:  Yes.   I discussed the limitations, risks, security and privacy concerns of performing an evaluation and management service by telemedicine. I also discussed with the patient that there may be a patient responsible charge related to this service. The patient expressed understanding and agreed to proceed.  Pt location: Home Physician Location: office Name of referring provider:  Merlyn Albert, MD I connected with Erin Wall at patients initiation/request on 02/16/2020 at  8:30 AM EST by video enabled telemedicine application and verified that I am speaking with the correct person using two identifiers. Pt MRN:  [id number] Pt DOB:  1945-10-08 Video Participants:  Erin Wall;  Erin Wall (sister)   History of Present Illness:  The patient had a virtual video visit on 02/16/2020. She is again accompanied by her sister who helps supplement the history today. Records were reviewed. She had Neuropsychological testing in June 2021 showing prominent areas of weakness surrounding learning and memory, cognitive flexibility, and expressive language. Diagnosis of Major Neurocognitive Disorder (ie dementia) likely due to Alzheimer's disease. It was noted that Lewy body dementia could not be ruled out, she does have hallucinations however did not have any other associated symptoms with intact visuoconstructional abilities on testing. She had side effects on Donepezil and was switched to Rivastigmine. Her sister and granddaughter come daily to administer medications, however can only come once a day, so she only gets the Rivastigmine daily. She is also on Xarelto daily. Family brings meals daily. She has stopped driving. All bills are on autopay.  She is able to dress herself, Erin Wall thinks she showers. She used to do laundry but Erin Wall is not sure if she still does. She continues to have occasional visual hallucinations, saying there is someone or people in the house with her. She is retired but still talks about going to school. These mostly occur in the evening. Family would have to reassure her that she is at home when she says she has to go home. No wandering behaviors.She gets agitated when they try to explain things that are not happening. Sleep is interrupted, Erin Wall is asking for medication to help with sleep. She denies any headaches, dizziness, focal numbness/tingling/weakness, no falls. She has been complaining of stomach pain, ?due to rivastigmine. She has occasional diarrhea. She denies any weight loss.    History on Initial Assessment 12/04/2018: This is a 75 year old right-handed woman with a history of diet-controlled hypertension, hyperlipidemia, presenting for evaluation of memory loss. Her sister Erin Wall is present during the visit to provide additional information. The patient reports that her memory is not bad, but not as sharp. She lives alone. She denies getting lost driving. She reports taking medications regularly. She reports missing a bill payment in Feb/March. She forgets names but not very often. She occasionally misplaces things. Her sister provides a different history. Erin Wall reports the patient started having memory changes a year ago. She is more forgetful, worse in the evening hours. She has lost her keys several times. In Feb/March, utilities were cut off, including her electricity, cable, and cell phone, she had bills up to $300-400, family helped her fix this and put everything on draft. She was getting lost driving Erin Wall disclosed this privately). Erin Wall  reports she gets very confused when she wakes up from dreams. She states she is having dreams. She has sundowning, getting confused and having difficulty  understanding what people are telling her. She knows what she wants to say but gets tangled up getting them out. Over the past 1-2 months, she started having hallucinations, telling her sister that there are children in the house or someone had come or their brother who lives out of town is there. She loses things and says somebody got her shoes and found it in her car. She states there was a bazaar that occurred. She is independent with dressing and bathing, no hygiene concerns. Her father had dementia in his 44s. She denies any history of significant head injuries or alcohol use.  She has urinary frequency. She denies any headaches, dizziness, diplopia, dysarthria, dysphagia, neck/back pain, focal numbness/tingling/weakness, bowel dysfunction. No anosmia, tremors, no falls. Sleep is good. She fell a few months ago on her deck and injured her knee. She states her mood is great. She is a retired Pharmacist, [employer].  I personally reviewed head CT without contrast done 09/2018 which did not show any acute changes. It was noted that the ventricles are rather prominent compared to sulci. While this finding may be due to atrophy, a degree of communicating hydrocephalus cannot be excluded on this study.    Laboratory Data: Lab Results  Component Value Date   TSH 0.661 09/23/2018   Lab Results  Component Value Date   [id number] 09/23/2018      Current Outpatient Medications on File Prior to Visit  Medication Sig Dispense Refill  . EPINEPHrine 0.3 mg/0.3 mL IJ SOAJ injection Inject 0.3 mLs (0.3 mg total) into the muscle as needed for anaphylaxis. 1 each 0  . rivaroxaban (XARELTO) 20 MG TABS tablet Take 1 tablet (20 mg total) by mouth daily with supper. 30 tablet 3  . rivastigmine (EXELON) 1.5 MG capsule Take 1 capsule (1.5 mg total) by mouth 2 (two) times daily. (Patient taking differently: Take 1.5 mg by mouth 2 (two) times daily. Pt is taken it daily) 60 capsule 11   No  current facility-administered medications on file prior to visit.     Observations/Objective:   Vitals:   02/16/20 0822  Weight: 184 lb (83.5 kg)  Height: 5' 11.5" (1.816 m)   GEN:  The patient appears stated age and is in NAD.  Neurological examination: Patient is awake, alert. No aphasia or dysarthria. Reduced fluency, able to follow commands. Remote and recent memory impaired. Cranial nerves: Extraocular movements intact with no nystagmus. No facial asymmetry. Motor: moves all extremities symmetrically, at least anti-gravity x 4. No incoordination on finger to nose testing. Gait: narrow-based and steady, able to tandem walk adequately. No tremors.   Assessment and Plan:   This is a 75 yo RH woman with a history of diet-controlled hypertension, hyperlipidemia, with Neuropsychological evaluation in June 2021 indicating dementia likely due to Alzheimer's disease. Findings discussed with the patient and her sister again today, she has minimal insight into her condition. Her sister reports more difficulties with supervision since she is home alone, discussed need for either higher level of care or home aide. Social worker resource provided to help navigate with family. Sister is asking about medication to help with sleep and agitation, start mirtazapine 15mg  qhs, side effects discussed. She appears to be having side effects on rivastigmine as well, stop rivastigmine. Her sister will call our office in a month for  an update on mirtazapine, if no side effects, we will plan to start Namenda XR (taken once a day to help with compliance). Continue close supervision. She does not drive. Follow-up in 6-8 months, they know to call for any changes.    Follow Up Instructions:   -I discussed the assessment and treatment plan with the patient. The patient was provided an opportunity to ask questions and all were answered. The patient agreed with the plan and demonstrated an understanding of the  instructions.   The patient was advised to call back or seek an in-person evaluation if the symptoms worsen or if the condition fails to improve as anticipated.    Cameron Sprang, MD

## 2020-02-18 ENCOUNTER — Other Ambulatory Visit: Payer: Self-pay | Admitting: *Deleted

## 2020-02-18 ENCOUNTER — Telehealth: Payer: Self-pay | Admitting: *Deleted

## 2020-02-18 DIAGNOSIS — I10 Essential (primary) hypertension: Secondary | ICD-10-CM

## 2020-02-18 DIAGNOSIS — E785 Hyperlipidemia, unspecified: Secondary | ICD-10-CM

## 2020-02-18 DIAGNOSIS — D72820 Lymphocytosis (symptomatic): Secondary | ICD-10-CM

## 2020-02-18 NOTE — Telephone Encounter (Signed)
Dr Ladona Ridgel wanted pt to have bw before her appt and I put in orders. Tried to call pt and vm not set up.

## 2020-02-23 NOTE — Telephone Encounter (Signed)
Patient had to reschedule appointment till the end of February due to sister who brings her is having surgery. Patient will have lab work completed one week before her appointment.

## 2020-02-24 ENCOUNTER — Ambulatory Visit: Payer: Medicare PPO | Admitting: Family Medicine

## 2020-02-25 ENCOUNTER — Other Ambulatory Visit (HOSPITAL_COMMUNITY): Payer: Self-pay | Admitting: Adult Health

## 2020-02-25 DIAGNOSIS — Z1231 Encounter for screening mammogram for malignant neoplasm of breast: Secondary | ICD-10-CM

## 2020-03-11 ENCOUNTER — Ambulatory Visit: Payer: Medicare PPO | Admitting: Family Medicine

## 2020-03-26 DIAGNOSIS — D72820 Lymphocytosis (symptomatic): Secondary | ICD-10-CM | POA: Diagnosis not present

## 2020-03-26 DIAGNOSIS — E785 Hyperlipidemia, unspecified: Secondary | ICD-10-CM | POA: Diagnosis not present

## 2020-03-26 DIAGNOSIS — I10 Essential (primary) hypertension: Secondary | ICD-10-CM | POA: Diagnosis not present

## 2020-03-27 LAB — COMPREHENSIVE METABOLIC PANEL
ALT: 18 IU/L (ref 0–32)
AST: 23 IU/L (ref 0–40)
Albumin/Globulin Ratio: 1.4 (ref 1.2–2.2)
Albumin: 4.2 g/dL (ref 3.7–4.7)
Alkaline Phosphatase: 61 IU/L (ref 44–121)
BUN/Creatinine Ratio: 12 (ref 12–28)
BUN: 13 mg/dL (ref 8–27)
Bilirubin Total: 0.5 mg/dL (ref 0.0–1.2)
CO2: 21 mmol/L (ref 20–29)
Calcium: 9.9 mg/dL (ref 8.7–10.3)
Chloride: 104 mmol/L (ref 96–106)
Creatinine, Ser: 1.06 mg/dL — ABNORMAL HIGH (ref 0.57–1.00)
GFR calc Af Amer: 60 mL/min/{1.73_m2} (ref >59)
GFR calc non Af Amer: 52 mL/min/{1.73_m2} — ABNORMAL LOW (ref >59)
Globulin, Total: 2.9 g/dL (ref 1.5–4.5)
Glucose: 99 mg/dL (ref 65–99)
Potassium: 4 mmol/L (ref 3.5–5.2)
Sodium: 140 mmol/L (ref 134–144)
Total Protein: 7.1 g/dL (ref 6.0–8.5)

## 2020-03-27 LAB — CBC WITH DIFFERENTIAL/PLATELET
Basophils Absolute: 0 10*3/uL (ref 0.0–0.2)
Basos: 0 %
EOS (ABSOLUTE): 0.1 10*3/uL (ref 0.0–0.4)
Eos: 2 %
Hematocrit: 42.7 % (ref 34.0–46.6)
Hemoglobin: 14.1 g/dL (ref 11.1–15.9)
Immature Grans (Abs): 0 10*3/uL (ref 0.0–0.1)
Immature Granulocytes: 0 %
Lymphocytes Absolute: 1.6 10*3/uL (ref 0.7–3.1)
Lymphs: 41 %
MCH: 28.3 pg (ref 26.6–33.0)
MCHC: 33 g/dL (ref 31.5–35.7)
MCV: 86 fL (ref 79–97)
Monocytes Absolute: 0.3 10*3/uL (ref 0.1–0.9)
Monocytes: 7 %
Neutrophils Absolute: 1.9 10*3/uL (ref 1.4–7.0)
Neutrophils: 50 %
Platelets: 366 10*3/uL (ref 150–450)
RBC: 4.98 x10E6/uL (ref 3.77–5.28)
RDW: 13.2 % (ref 11.7–15.4)
WBC: 3.9 10*3/uL (ref 3.4–10.8)

## 2020-03-27 LAB — LIPID PANEL
Chol/HDL Ratio: 3.2 ratio (ref 0.0–4.4)
Cholesterol, Total: 210 mg/dL — ABNORMAL HIGH (ref 100–199)
HDL: 65 mg/dL (ref >39)
LDL Chol Calc (NIH): 134 mg/dL — ABNORMAL HIGH (ref 0–99)
Triglycerides: 59 mg/dL (ref 0–149)
VLDL Cholesterol Cal: 11 mg/dL (ref 5–40)

## 2020-04-07 ENCOUNTER — Telehealth: Payer: Self-pay | Admitting: Adult Health

## 2020-04-07 ENCOUNTER — Ambulatory Visit: Payer: Medicare PPO | Admitting: Family Medicine

## 2020-04-07 NOTE — Telephone Encounter (Signed)
Pt is scheduled for a PAP/physical on 05/27/2020 with Anderson Malta, pt would like labs ordered so she can get them done before appointment & results be ready for her visit  Please advise & call pt (OK to leave a message)

## 2020-04-15 ENCOUNTER — Ambulatory Visit (HOSPITAL_COMMUNITY)
Admission: RE | Admit: 2020-04-15 | Discharge: 2020-04-15 | Disposition: A | Discharge status: Home / Self Care | Payer: Medicare PPO | Source: Ambulatory Visit | Attending: Adult Health | Admitting: Adult Health

## 2020-04-15 DIAGNOSIS — Z1231 Encounter for screening mammogram for malignant neoplasm of breast: Secondary | ICD-10-CM | POA: Diagnosis not present

## 2020-04-26 ENCOUNTER — Other Ambulatory Visit: Payer: Self-pay

## 2020-04-26 ENCOUNTER — Ambulatory Visit (INDEPENDENT_AMBULATORY_CARE_PROVIDER_SITE_OTHER): Payer: Medicare PPO | Admitting: Family Medicine

## 2020-04-26 VITALS — BP 124/84 | HR 84 | Temp 98.3°F | Wt 181.0 lb

## 2020-04-26 DIAGNOSIS — I825Y2 Chronic embolism and thrombosis of unspecified deep veins of left proximal lower extremity: Secondary | ICD-10-CM | POA: Diagnosis not present

## 2020-04-26 MED ORDER — RIVAROXABAN 20 MG PO TABS: 20.0000 mg | ORAL_TABLET | Freq: Every day | ORAL | 1 refills | Status: DC

## 2020-04-26 NOTE — Progress Notes (Signed)
Patient ID: Erin Wall, female    DOB: 1945/05/06, 75 y.o.   MRN: [id number]   Chief Complaint  Patient presents with  . Hyperlipidemia    Having problems with her stomach eating   Subjective:   Discuss recent labs HPI  Pt seen with daughter today. Pt has h/o dementia.   Seeing neurology for memory concerns.  Not on the rivastigmine and mirtazapine, per daughter, bc no one there to give it to her daily. Pt is home alone, doing okay per the daughter, they come and check on her daily. Has neighbors she can go to. Pt not cooking in the house. Taking xarelto for DVT. Fixing her own breakfast and a sandwich. Not had concerns of leaving the house.  abd pain- Has pain with eating or not.  Per daughter. After eating it helped. Daughter thinking it's when she has forgotten to eat that morning.   No vomiting, or diarrhea.  Medical History Erin Wall has a past medical history of Arthritis (05/09/2012), Borderline diabetes, Generalized anxiety disorder, GERD (gastroesophageal reflux disease), Hematuria (12/07/2014), Hemorrhoids, Hyperlipemia, Hyperlipidemia (05/09/2012), Hypertension, Impaired fasting glucose (11/15/2012), Lung nodules, Lymphocytosis (05/09/2012), Major neurocognitive disorder due to Alzheimer's disease, possible (08/13/2019), Postmenopausal (12/07/2014), Tendonitis, Unilateral primary osteoarthritis, right knee (01/17/2019), and Urinary frequency (12/07/2014).   Outpatient Encounter Medications as of 04/26/2020  Medication Sig  . EPINEPHrine 0.3 mg/0.3 mL IJ SOAJ injection Inject 0.3 mLs (0.3 mg total) into the muscle as needed for anaphylaxis.  . rivaroxaban (XARELTO) 20 MG TABS tablet Take 1 tablet (20 mg total) by mouth daily with supper.  . [DISCONTINUED] mirtazapine (REMERON) 15 MG tablet Take 1 tablet (15 mg total) by mouth at bedtime.  . [DISCONTINUED] rivaroxaban (XARELTO) 20 MG TABS tablet Take 1 tablet (20 mg total) by mouth daily with supper.  . [DISCONTINUED]  rivastigmine (EXELON) 1.5 MG capsule Take 1 capsule (1.5 mg total) by mouth 2 (two) times daily. (Patient taking differently: Take 1.5 mg by mouth 2 (two) times daily. Pt is taken it daily)   No facility-administered encounter medications on file as of 04/26/2020.     Review of Systems  Constitutional: Negative for chills and fever.  HENT: Negative for congestion, rhinorrhea and sore throat.   Respiratory: Negative for cough, shortness of breath and wheezing.   Cardiovascular: Negative for chest pain and leg swelling.  Gastrointestinal: Positive for abdominal pain (intermittent). Negative for diarrhea, nausea and vomiting.  Genitourinary: Negative for dysuria and frequency.  Musculoskeletal: Negative for arthralgias and back pain.  Skin: Negative for rash.  Neurological: Negative for dizziness, weakness and headaches.     Vitals BP 124/84   Pulse 84   Temp 98.3 F (36.8 C) (Oral)   Wt 181 lb (82.1 kg)   SpO2 100%   BMI 24.89 kg/m   Objective:   Physical Exam Vitals and nursing note reviewed.  Constitutional:      Appearance: Normal appearance.  HENT:     Head: Normocephalic and atraumatic.     Nose: Nose normal.     Mouth/Throat:     Mouth: Mucous membranes are moist.     Pharynx: Oropharynx is clear.  Eyes:     Extraocular Movements: Extraocular movements intact.     Conjunctiva/sclera: Conjunctivae normal.     Pupils: Pupils are equal, round, and reactive to light.  Cardiovascular:     Rate and Rhythm: Normal rate and regular rhythm.     Pulses: Normal pulses.     Heart sounds: Normal heart  sounds.  Pulmonary:     Effort: Pulmonary effort is normal.     Breath sounds: Normal breath sounds. No wheezing, rhonchi or rales.  Abdominal:     General: Abdomen is flat. Bowel sounds are normal. There is no distension.     Palpations: Abdomen is soft. There is no mass.     Tenderness: There is no abdominal tenderness. There is no guarding or rebound.     Hernia: No  hernia is present.  Musculoskeletal:        General: Normal range of motion.     Right lower leg: No edema.     Left lower leg: No edema.  Skin:    General: Skin is warm and dry.     Findings: No lesion or rash.  Neurological:     General: No focal deficit present.     Mental Status: She is alert and oriented to person, place, and time.  Psychiatric:        Mood and Affect: Mood normal.        Behavior: Behavior normal.     Comments: +h/o dementia     Assessment and Plan   1. Chronic deep vein thrombosis (DVT) of proximal vein of left lower extremity (HCC)   H/o dvt-chronic- Pt on xarelto for dvt.  No bleeding. Will cont.  Stomach pain- cont to monitor. Weight is stable. Call or rto if worsening abd pain, fever, n/v/d.  Daughter in agreement.  H/o dementia- unable to give her medications daily for this.  So they stopped them.  Pt may require more care daily than just checking in on her once daily. Reviewed this with the daughter for safety concerns.    Labs stable.    Pt to f/u 67mo or prn.  Return in about 6 months (around 10/27/2020) for f/u h/o dvt.  Wt Readings from Last 3 Encounters:  04/26/20 181 lb (82.1 kg)  02/16/20 184 lb (83.5 kg)  10/28/19 184 lb 12.8 oz (83.8 kg)

## 2020-05-27 ENCOUNTER — Ambulatory Visit (INDEPENDENT_AMBULATORY_CARE_PROVIDER_SITE_OTHER): Payer: Medicare PPO | Admitting: Adult Health

## 2020-05-27 ENCOUNTER — Encounter: Payer: Self-pay | Admitting: Adult Health

## 2020-05-27 ENCOUNTER — Other Ambulatory Visit: Payer: Self-pay

## 2020-05-27 VITALS — BP 148/84 | HR 72 | Ht 69.0 in | Wt 182.0 lb

## 2020-05-27 DIAGNOSIS — Z1211 Encounter for screening for malignant neoplasm of colon: Secondary | ICD-10-CM | POA: Diagnosis not present

## 2020-05-27 DIAGNOSIS — Z86718 Personal history of other venous thrombosis and embolism: Secondary | ICD-10-CM

## 2020-05-27 DIAGNOSIS — Z01419 Encounter for gynecological examination (general) (routine) without abnormal findings: Secondary | ICD-10-CM | POA: Insufficient documentation

## 2020-05-27 DIAGNOSIS — G309 Alzheimer's disease, unspecified: Secondary | ICD-10-CM | POA: Diagnosis not present

## 2020-05-27 DIAGNOSIS — Z90710 Acquired absence of both cervix and uterus: Secondary | ICD-10-CM | POA: Insufficient documentation

## 2020-05-27 DIAGNOSIS — M25461 Effusion, right knee: Secondary | ICD-10-CM | POA: Insufficient documentation

## 2020-05-27 DIAGNOSIS — M25561 Pain in right knee: Secondary | ICD-10-CM

## 2020-05-27 DIAGNOSIS — F0280 Dementia in other diseases classified elsewhere without behavioral disturbance: Secondary | ICD-10-CM | POA: Diagnosis not present

## 2020-05-27 LAB — HEMOCCULT GUIAC POC 1CARD (OFFICE): Fecal Occult Blood, POC: NEGATIVE

## 2020-05-27 NOTE — Progress Notes (Addendum)
Patient ID: DALMA PANCHAL, female   DOB: 1945/12/30, 75 y.o.   MRN: [id number] History of Present Illness:  Meghen is a 75 year old black female, single, sp hysterectomy, in for well woman gyn exam. Her sister [name] is with her. PCP is Dr Lovena Le   Current Medications, Allergies, Past Medical History, Past Surgical History, Family History and Social History were reviewed in [facility] record.     Review of Systems: Patient denies any headaches, hearing loss, fatigue, blurred vision, shortness of breath, chest pain, problems with bowel movements, urination, or intercourse(not active). No mood swings. Pain in right knee and stomach hurts at times and better if eats.    Physical Exam:BP (!) 148/84 (BP Location: Left Arm, Patient Position: Sitting, Cuff Size: Normal)   Pulse 72   Ht 5\' 9"  (1.753 m)   Wt 182 lb (82.6 kg)   BMI 26.88 kg/m  General:  Well developed, well nourished, no acute distress Skin:  Warm and dry Neck:  Midline trachea, normal thyroid, good ROM, no lymphadenopathy,no carotid bruits heard Lungs; Clear to auscultation bilaterally Breast:  No dominant palpable mass, retraction, or nipple discharge Cardiovascular: Regular rate and rhythm Abdomen:  Soft, non tender, no hepatosplenomegaly Pelvic:  External genitalia is normal in appearance, no lesions.  The vagina is pale with loss of moisture and rugae. Urethra has no lesions or masses. The cervix and uterus are absent.  No adnexal masses or tenderness noted.Bladder is non tender, no masses felt. Rectal: Good sphincter tone, no polyps, or hemorrhoids felt.  Hemoccult negative. Extremities/musculoskeletal:  No varicosities noted, no clubbing or cyanosis has swelling right knee and mildly tender and swelling in left ankle has hx DVT leg leg Psych:  No mood changes, cooperative,seems happy, but has some cognitive loss but she covers it well at times AA is 0 Fall risk is low PHQ 9 score is 8, she says  she is not depressed GAD 7 score is 9  [facility] - 05/27/20 1344      Pregnancy Intention Screening   Does the patient want to become pregnant in the next year? N/A    Does the patient's partner want to become pregnant in the next year? N/A    Would the patient like to discuss contraceptive options today? N/A      Contraception Wrap Up   Current Method Female Sterilization   hyst   End Method Female Sterilization   hyst   Contraception Counseling Provided No         Examination chaperoned by Levy Pupa LPN   Impression and Plan: 1. Encounter for well woman exam Does not need to come back unless having a problem Physical with PCP  -labs with PCP Mammogram yearly -colonoscopy per GI  2. Encounter for screening fecal occult blood testing  3. S/P hysterectomy   4. Pain and swelling of right knee Call Dr Lorin Mercy or Dr Lance Sell office   5. History of DVT of lower extremity   6. Alzheimer disease ([place])

## 2020-05-31 ENCOUNTER — Telehealth: Payer: Self-pay | Admitting: Family Medicine

## 2020-05-31 NOTE — Telephone Encounter (Signed)
Spoke with patient's daughter she stated patient has dementia and it was not a good time for her to do the AWV at this time.

## 2020-06-10 ENCOUNTER — Ambulatory Visit (INDEPENDENT_AMBULATORY_CARE_PROVIDER_SITE_OTHER): Payer: Medicare PPO | Admitting: Orthopaedic Surgery

## 2020-06-10 ENCOUNTER — Other Ambulatory Visit: Payer: Self-pay

## 2020-06-10 ENCOUNTER — Ambulatory Visit (INDEPENDENT_AMBULATORY_CARE_PROVIDER_SITE_OTHER): Payer: Medicare PPO

## 2020-06-10 DIAGNOSIS — M25561 Pain in right knee: Secondary | ICD-10-CM

## 2020-06-10 DIAGNOSIS — M1711 Unilateral primary osteoarthritis, right knee: Secondary | ICD-10-CM | POA: Diagnosis not present

## 2020-06-10 MED ORDER — BUPIVACAINE HCL 0.25 % IJ SOLN
4.0000 mL | INTRAMUSCULAR | Status: AC | PRN
  Administered 2020-06-10: 4 mL via INTRA_ARTICULAR

## 2020-06-10 MED ORDER — LIDOCAINE HCL 1 % IJ SOLN
0.5000 mL | INTRAMUSCULAR | Status: AC | PRN
  Administered 2020-06-10: .5 mL

## 2020-06-10 MED ORDER — METHYLPREDNISOLONE ACETATE 40 MG/ML IJ SUSP
40.0000 mg | INTRAMUSCULAR | Status: AC | PRN
  Administered 2020-06-10: 40 mg via INTRA_ARTICULAR

## 2020-06-10 NOTE — Progress Notes (Signed)
Office Visit Note   Patient: Erin Wall           Date of Birth: Feb 10, 1946           MRN: [id number] Visit Date: 06/10/2020              Requested by: Erven Colla, DO [place],  [place] [id number] PCP: Erven Colla, DO   Assessment & Plan: Visit Diagnoses:  1. Right knee pain, unspecified chronicity   2. Unilateral primary osteoarthritis, right knee     Plan: Knee injection performed which she tolerated well with good improvement in pain.  She is here with her sisters had previous total knee arthroplasty.  Previous injection lasted over 2 years or so follow-up as needed.  Follow-Up Instructions: No follow-ups on file.   Orders:  Orders Placed This Encounter  Procedures  . XR Knee 1-2 Views Right   No orders of the defined types were placed in this encounter.     Procedures: Large Joint Inj: R knee on 06/10/2020 11:46 AM Indications: pain and joint swelling Details: 22 G 1.5 in needle, anterolateral approach  Arthrogram: No  Medications: 40 mg methylPREDNISolone acetate 40 MG/ML; 0.5 mL lidocaine 1 %; 4 mL bupivacaine 0.25 % Outcome: tolerated well, no immediate complications Procedure, treatment alternatives, risks and benefits explained, specific risks discussed. Consent was given by the patient. Immediately prior to procedure a time out was called to verify the correct patient, procedure, equipment, support staff and site/side marked as required. Patient was prepped and draped in the usual sterile fashion.       Clinical Data: No additional findings.   Subjective: Chief Complaint  Patient presents with  . Right Knee - Pain    HPI 75 year old female returns with recurrent problems with the right knee.  Previous injection 2020 gave her good relief for many months.  She is here with her sister patient has decreased memory diagnosis Alzheimer's.  She has some swelling and has pain in her knees off and on.  Bilateral lower extremity  swelling without venous stasis changes.  Previous DVT she is on chronic Xarelto.  Review of Systems all other systems noncontributory to HPI.   Objective: Vital Signs: There were no vitals taken for this visit.  Physical Exam Constitutional:      Appearance: She is well-developed.     Comments: Pleasant alert conversive memory is fair.  HENT:     Head: Normocephalic.     Right Ear: External ear normal.     Left Ear: External ear normal.  Eyes:     Pupils: Pupils are equal, round, and reactive to light.  Neck:     Thyroid: No thyromegaly.     Trachea: No tracheal deviation.  Cardiovascular:     Rate and Rhythm: Normal rate.  Pulmonary:     Effort: Pulmonary effort is normal.  Abdominal:     Palpations: Abdomen is soft.  Skin:    General: Skin is warm and dry.  Neurological:     Mental Status: She is alert and oriented to person, place, and time.  Psychiatric:        Behavior: Behavior normal.     Ortho Exam bilateral genu valgum.  Slight circumduction gait.  Right knee lacks 5 to 10 degrees reaching full extension 2+ knee effusion crepitus with knee range of motion.  Opposite left knee does reach full extension.  Less crepitus left than right knee.  Negative logroll the hips.  Specialty Comments:  No specialty comments available.  Imaging: No results found.   PMFS History: Patient Active Problem List   Diagnosis Date Noted  . Alzheimer disease ([place]) 05/27/2020  . History of DVT of lower extremity 05/27/2020  . S/P hysterectomy 05/27/2020  . Encounter for screening fecal occult blood testing 05/27/2020  . Pain and swelling of right knee 05/27/2020  . Encounter for well woman exam 05/27/2020  . Acute deep vein thrombosis (DVT) of proximal vein of left lower extremity ([place]) 09/30/2019  . Hives 09/17/2019  . Major neurocognitive disorder due to Alzheimer's disease, possible 08/13/2019  . Unilateral primary osteoarthritis, right knee 01/17/2019  . Urinary frequency  12/07/2014  . Hematuria 12/07/2014  . Postmenopausal 12/07/2014  . Constipation 12/02/2012  . Unspecified hereditary and idiopathic peripheral neuropathy 11/15/2012  . Impaired fasting glucose 11/15/2012  . Arthritis 05/09/2012  . Hyperlipidemia 05/09/2012  . Lymphocytosis 05/09/2012  . Hypertension 05/09/2012  . Weight loss 08/24/2010  . Flatulence, eructation, and gas pain 10/13/2009  . Abdominal pain, epigastric 08/28/2008   Past Medical History:  Diagnosis Date  . Arthritis 05/09/2012  . Borderline diabetes   . DVT (deep venous thrombosis) ([place])   . Generalized anxiety disorder   . GERD (gastroesophageal reflux disease)   . Hematuria 12/07/2014  . Hemorrhoids   . Hyperlipemia   . Hyperlipidemia 05/09/2012  . Hypertension   . Impaired fasting glucose 11/15/2012  . Lung nodules   . Lymphocytosis 05/09/2012  . Major neurocognitive disorder due to Alzheimer's disease, possible 08/13/2019  . Postmenopausal 12/07/2014  . Tendonitis   . Unilateral primary osteoarthritis, right knee 01/17/2019  . Urinary frequency 12/07/2014    Family History  Problem Relation Age of Onset  . Hypertension Mother   . Diabetes Mother   . Hypertension Father   . Diabetes Father   . Heart attack Father   . Stroke Father   . Cancer Brother   . Colon cancer Neg Hx   . Colon polyps Neg Hx     Past Surgical History:  Procedure Laterality Date  . ABDOMINAL HYSTERECTOMY    . BACTERIAL OVERGROWTH TEST  01/01/2012   Procedure: BACTERIAL OVERGROWTH TEST;  Surgeon: Danie Binder, MD;  Location: [facility];  Service: Endoscopy;  Laterality: N/A;  7:30  . BREAST BIOPSY    . BREAST EXCISIONAL BIOPSY Left 1978   benign  . BREAST SURGERY     tumor removed  . COLONOSCOPY  2004  . COLONOSCOPY  09/20/2010   DQQ:[id number] polyps,internal hemorrhoids/polypoid lesion in the cecum  . ESOPHAGOGASTRODUODENOSCOPY  09/20/2010   JJH:ERDEYCXKG/YJEH gastritis   Social History   Occupational History  .  Occupation: retired Pharmacist, hospital   Tobacco Use  . Smoking status: Never Smoker  . Smokeless tobacco: Never Used  Vaping Use  . Vaping Use: Never used  Substance and Sexual Activity  . Alcohol use: No  . Drug use: No  . Sexual activity: Not Currently    Birth control/protection: Surgical, Post-menopausal    Comment: hyst

## 2020-09-01 ENCOUNTER — Encounter: Payer: Self-pay | Admitting: *Deleted

## 2020-09-16 ENCOUNTER — Encounter: Payer: Self-pay | Admitting: Orthopaedic Surgery

## 2020-09-16 ENCOUNTER — Ambulatory Visit (INDEPENDENT_AMBULATORY_CARE_PROVIDER_SITE_OTHER): Payer: Medicare PPO | Admitting: Orthopaedic Surgery

## 2020-09-16 ENCOUNTER — Other Ambulatory Visit: Payer: Self-pay

## 2020-09-16 VITALS — Ht 70.0 in | Wt 179.0 lb

## 2020-09-16 DIAGNOSIS — M1711 Unilateral primary osteoarthritis, right knee: Secondary | ICD-10-CM | POA: Diagnosis not present

## 2020-09-16 NOTE — Progress Notes (Signed)
Office Visit Note   Patient: Erin Wall           Date of Birth: 1945-05-30           MRN: [id number] Visit Date: 09/16/2020              Requested by: Erven Colla, DO [place],  [place] [id number] PCP: Erven Colla, DO   Assessment & Plan: Visit Diagnoses:  1. Unilateral primary osteoarthritis, right knee     Plan: She can look into some support stockings for the pitting edema which may help her ambulation.  She has some knee arthritis with genu valgum lacks full extension but is still ambulatory.  When she gets progression of symptoms she can return.  Follow-Up Instructions: No follow-ups on file.   Orders:  No orders of the defined types were placed in this encounter.  No orders of the defined types were placed in this encounter.     Procedures: No procedures performed   Clinical Data: No additional findings.   Subjective: Chief Complaint  Patient presents with   Right Knee - Pain    HPI 75 year old female here with her sister.  Patient has some Alzheimer's she has had some aching pain in her knee particularly when her knee gets cold.  She has bilateral genu valgum slight circumduction type gait.  Mild swelling of the knee.  Bilateral pitting edema.  She is to have TED hose but does not know where they are currently.  Denies any true locking of the knee just occasional aching.  Knee injection in April gave her some relief for several months.  Review of Systems all other systems noncontributory at other than as mentioned in HPI.   Objective: Vital Signs: Ht '5\' 10"'$  (1.778 m)   Wt 179 lb (81.2 kg)   BMI 25.68 kg/m   Physical Exam Constitutional:      Appearance: She is well-developed.  HENT:     Head: Normocephalic.     Right Ear: External ear normal.     Left Ear: External ear normal. There is no impacted cerumen.  Eyes:     Pupils: Pupils are equal, round, and reactive to light.  Neck:     Thyroid: No thyromegaly.     Trachea:  No tracheal deviation.  Cardiovascular:     Rate and Rhythm: Normal rate.  Pulmonary:     Effort: Pulmonary effort is normal.  Abdominal:     Palpations: Abdomen is soft.  Musculoskeletal:     Cervical back: No rigidity.  Skin:    General: Skin is warm and dry.  Neurological:     Mental Status: She is alert and oriented to person, place, and time.  Psychiatric:        Behavior: Behavior normal.    Ortho Exam mild right knee effusion.  Genu valgum.  She lacks slightly more than 10 degrees reaching full extension of the right knee.  Bilateral pitting edema.  No venous stasis ulceration.  Pedal pulse palpable.  Specialty Comments:  No specialty comments available.  Imaging: No results found.   PMFS History: Patient Active Problem List   Diagnosis Date Noted   Alzheimer disease ([place]) 05/27/2020   History of DVT of lower extremity 05/27/2020   S/P hysterectomy 05/27/2020   Encounter for screening fecal occult blood testing 05/27/2020   Pain and swelling of right knee 05/27/2020   Encounter for well woman exam 05/27/2020   Acute deep vein thrombosis (  DVT) of proximal vein of left lower extremity ([place]) 09/30/2019   Hives 09/17/2019   Major neurocognitive disorder due to Alzheimer's disease, possible 08/13/2019   Unilateral primary osteoarthritis, right knee 01/17/2019   Urinary frequency 12/07/2014   Hematuria 12/07/2014   Postmenopausal 12/07/2014   Constipation 12/02/2012   Unspecified hereditary and idiopathic peripheral neuropathy 11/15/2012   Impaired fasting glucose 11/15/2012   Arthritis 05/09/2012   Hyperlipidemia 05/09/2012   Lymphocytosis 05/09/2012   Hypertension 05/09/2012   Weight loss 08/24/2010   Flatulence, eructation, and gas pain 10/13/2009   Abdominal pain, epigastric 08/28/2008   Past Medical History:  Diagnosis Date   Arthritis 05/09/2012   Borderline diabetes    DVT (deep venous thrombosis) (H[facility])    Generalized anxiety disorder    GERD  (gastroesophageal reflux disease)    Hematuria 12/07/2014   Hemorrhoids    Hyperlipemia    Hyperlipidemia 05/09/2012   Hypertension    Impaired fasting glucose 11/15/2012   Lung nodules    Lymphocytosis 05/09/2012   Major neurocognitive disorder due to Alzheimer's disease, possible 08/13/2019   Postmenopausal 12/07/2014   Tendonitis    Unilateral primary osteoarthritis, right knee 01/17/2019   Urinary frequency 12/07/2014    Family History  Problem Relation Age of Onset   Hypertension Mother    Diabetes Mother    Hypertension Father    Diabetes Father    Heart attack Father    Stroke Father    Cancer Brother    Colon cancer Neg Hx    Colon polyps Neg Hx     Past Surgical History:  Procedure Laterality Date   ABDOMINAL HYSTERECTOMY     BACTERIAL OVERGROWTH TEST  01/01/2012   Procedure: BACTERIAL OVERGROWTH TEST;  Surgeon: Danie Binder, MD;  Location: [facility];  Service: Endoscopy;  Laterality: N/A;  7:30   BREAST BIOPSY     BREAST EXCISIONAL BIOPSY Left 1978   benign   BREAST SURGERY     tumor removed   COLONOSCOPY  2004   COLONOSCOPY  09/20/2010   [id number] polyps,internal hemorrhoids/polypoid lesion in the cecum   ESOPHAGOGASTRODUODENOSCOPY  09/20/2010   [id number] gastritis   Social History   Occupational History   Occupation: retired Pharmacist, hospital   Tobacco Use   Smoking status: Never   Smokeless tobacco: Never  Vaping Use   Vaping Use: Never used  Substance and Sexual Activity   Alcohol use: No   Drug use: No   Sexual activity: Not Currently    Birth control/protection: Surgical, Post-menopausal    Comment: hyst

## 2020-10-14 ENCOUNTER — Ambulatory Visit (INDEPENDENT_AMBULATORY_CARE_PROVIDER_SITE_OTHER): Payer: Medicare PPO | Admitting: Orthopaedic Surgery

## 2020-10-14 ENCOUNTER — Other Ambulatory Visit: Payer: Self-pay

## 2020-10-14 ENCOUNTER — Encounter: Payer: Self-pay | Admitting: Orthopaedic Surgery

## 2020-10-14 VITALS — Ht 70.0 in | Wt 179.0 lb

## 2020-10-14 DIAGNOSIS — M1711 Unilateral primary osteoarthritis, right knee: Secondary | ICD-10-CM | POA: Diagnosis not present

## 2020-10-14 MED ORDER — METHYLPREDNISOLONE ACETATE 40 MG/ML IJ SUSP
40.0000 mg | INTRAMUSCULAR | Status: AC | PRN
  Administered 2020-10-14: 40 mg via INTRA_ARTICULAR

## 2020-10-14 MED ORDER — LIDOCAINE HCL 1 % IJ SOLN
0.5000 mL | INTRAMUSCULAR | Status: AC | PRN
  Administered 2020-10-14: .5 mL

## 2020-10-14 MED ORDER — BUPIVACAINE HCL 0.25 % IJ SOLN
4.0000 mL | INTRAMUSCULAR | Status: AC | PRN
Start: 2020-10-14 — End: 2020-10-14
  Administered 2020-10-14: 4 mL via INTRA_ARTICULAR

## 2020-10-14 NOTE — Progress Notes (Signed)
Office Visit Note   Patient: Erin Wall           Date of Birth: 20-Nov-1945           MRN: [id number] Visit Date: 10/14/2020              Requested by: Erven Colla, DO [place],  [place] [id number] PCP: Erven Colla, DO   Assessment & Plan: Visit Diagnoses:  1. Unilateral primary osteoarthritis, right knee     Plan: Right knee injection performed.  Of note is history of DVT.  She has cognitive problems.  She can follow-up if she has persistent problems with her knee and she is here with her sister Erin Wall.  Follow-Up Instructions: No follow-ups on file.   Orders:  Orders Placed This Encounter  Procedures   Large Joint Inj: R knee   No orders of the defined types were placed in this encounter.     Procedures: Large Joint Inj: R knee on 10/14/2020 11:01 AM Indications: pain and joint swelling Details: 22 G 1.5 in needle, anterolateral approach  Arthrogram: No  Medications: 40 mg methylPREDNISolone acetate 40 MG/ML; 0.5 mL lidocaine 1 %; 4 mL bupivacaine 0.25 % Outcome: tolerated well, no immediate complications Procedure, treatment alternatives, risks and benefits explained, specific risks discussed. Consent was given by the patient. Immediately prior to procedure a time out was called to verify the correct patient, procedure, equipment, support staff and site/side marked as required. Patient was prepped and draped in the usual sterile fashion.      Clinical Data: No additional findings.   Subjective: Chief Complaint  Patient presents with   Right Knee - Pain   Left Knee - Pain    HPI 75 year old female returns for ongoing problems of the right knee and is requesting her 1 final repeat injection.  She has some swelling in her knees some pain medial and lateral.  Mild genu valgum slightly worse on the right than left knee.  Review of Systems 14 point system update unchanged from 09/16/2020.   Objective: Vital Signs: Ht '5\' 10"'$  (1.778  m)   Wt 179 lb (81.2 kg)   BMI 25.68 kg/m   Physical Exam Constitutional:      Appearance: She is well-developed.  HENT:     Head: Normocephalic.     Right Ear: External ear normal.     Left Ear: External ear normal. There is no impacted cerumen.  Eyes:     Pupils: Pupils are equal, round, and reactive to light.  Neck:     Thyroid: No thyromegaly.     Trachea: No tracheal deviation.  Cardiovascular:     Rate and Rhythm: Normal rate.  Pulmonary:     Effort: Pulmonary effort is normal.  Abdominal:     Palpations: Abdomen is soft.  Musculoskeletal:     Cervical back: No rigidity.  Skin:    General: Skin is warm and dry.  Neurological:     Mental Status: She is alert and oriented to person, place, and time.  Psychiatric:        Behavior: Behavior normal.    Ortho Exam mild knee effusion some tenderness at the joint line more lateral than medial.  Crepitus with knee range of motion.  She lacks 10 degrees reaching full extension the right knee.  Slight pitting edema both knees.  Specialty Comments:  No specialty comments available.  Imaging: No results found.   PMFS History: Patient Active Problem  List   Diagnosis Date Noted   Alzheimer disease ([facility]) 05/27/2020   History of DVT of lower extremity 05/27/2020   S/P hysterectomy 05/27/2020   Encounter for screening fecal occult blood testing 05/27/2020   Pain and swelling of right knee 05/27/2020   Encounter for well woman exam 05/27/2020   Acute deep vein thrombosis (DVT) of proximal vein of left lower extremity ([facility]) 09/30/2019   Hives 09/17/2019   Major neurocognitive disorder due to Alzheimer's disease, possible 08/13/2019   Unilateral primary osteoarthritis, right knee 01/17/2019   Urinary frequency 12/07/2014   Hematuria 12/07/2014   Postmenopausal 12/07/2014   Constipation 12/02/2012   Unspecified hereditary and idiopathic peripheral neuropathy 11/15/2012   Impaired fasting glucose 11/15/2012   Arthritis  05/09/2012   Hyperlipidemia 05/09/2012   Lymphocytosis 05/09/2012   Hypertension 05/09/2012   Weight loss 08/24/2010   Flatulence, eructation, and gas pain 10/13/2009   Abdominal pain, epigastric 08/28/2008   Past Medical History:  Diagnosis Date   Arthritis 05/09/2012   Borderline diabetes    DVT (deep venous thrombosis) ([facility])    Generalized anxiety disorder    GERD (gastroesophageal reflux disease)    Hematuria 12/07/2014   Hemorrhoids    Hyperlipemia    Hyperlipidemia 05/09/2012   Hypertension    Impaired fasting glucose 11/15/2012   Lung nodules    Lymphocytosis 05/09/2012   Major neurocognitive disorder due to Alzheimer's disease, possible 08/13/2019   Postmenopausal 12/07/2014   Tendonitis    Unilateral primary osteoarthritis, right knee 01/17/2019   Urinary frequency 12/07/2014    Family History  Problem Relation Age of Onset   Hypertension Mother    Diabetes Mother    Hypertension Father    Diabetes Father    Heart attack Father    Stroke Father    Cancer Brother    Colon cancer Neg Hx    Colon polyps Neg Hx     Past Surgical History:  Procedure Laterality Date   ABDOMINAL HYSTERECTOMY     BACTERIAL OVERGROWTH TEST  01/01/2012   Procedure: BACTERIAL OVERGROWTH TEST;  Surgeon: Danie Binder, MD;  Location: [facility];  Service: Endoscopy;  Laterality: N/A;  7:30   BREAST BIOPSY     BREAST EXCISIONAL BIOPSY Left 1978   benign   BREAST SURGERY     tumor removed   COLONOSCOPY  2004   COLONOSCOPY  09/20/2010   [id number] polyps,internal hemorrhoids/polypoid lesion in the cecum   ESOPHAGOGASTRODUODENOSCOPY  09/20/2010   [id number] gastritis   Social History   Occupational History   Occupation: retired Pharmacist, hospital   Tobacco Use   Smoking status: Never   Smokeless tobacco: Never  Vaping Use   Vaping Use: Never used  Substance and Sexual Activity   Alcohol use: No   Drug use: No   Sexual activity: Not Currently    Birth control/protection:  Surgical, Post-menopausal    Comment: hyst

## 2021-04-18 ENCOUNTER — Other Ambulatory Visit: Payer: Self-pay

## 2021-04-18 ENCOUNTER — Encounter: Payer: Self-pay | Admitting: Neurology

## 2021-04-18 ENCOUNTER — Ambulatory Visit: Payer: Medicare PPO | Admitting: Neurology

## 2021-04-18 VITALS — BP 126/61 | HR 102 | Ht 71.0 in | Wt 190.2 lb

## 2021-04-18 DIAGNOSIS — F02818 Dementia in other diseases classified elsewhere, unspecified severity, with other behavioral disturbance: Secondary | ICD-10-CM | POA: Diagnosis not present

## 2021-04-18 DIAGNOSIS — G301 Alzheimer's disease with late onset: Secondary | ICD-10-CM

## 2021-04-18 MED ORDER — RIVASTIGMINE TARTRATE 1.5 MG PO CAPS: 1.5000 mg | ORAL_CAPSULE | Freq: Two times a day (BID) | ORAL | 11 refills | Status: DC

## 2021-04-18 MED ORDER — MIRTAZAPINE 15 MG PO TABS: 15.0000 mg | ORAL_TABLET | Freq: Every day | ORAL | 11 refills | Status: DC

## 2021-04-18 NOTE — Progress Notes (Signed)
NEUROLOGY FOLLOW UP OFFICE NOTE  Erin Wall [id number] 06-Aug-1945  HISTORY OF PRESENT ILLNESS: I had the pleasure of seeing Erin Wall in follow-up in the neurology clinic on 04/18/2021.  The patient was last seen almost a year ago for dementia likely due to Alzheimer's disease. She is again accompanied by her sister Erin Wall who supplements the history today.  Records and images were personally reviewed where available.  On her last visit, they reported side effects on Donepezil and Rivastigmine. Concern on last visit was sleep difficulties and occasional visual hallucinations. She was started on mirtazapine '15mg'$  qhs however this is not on her medication list, her sister reports it was not filled at the pharmacy. She brings a bottle of Donepezil and Rivastigmine, but states the patient is not taking either of these (both are expired). There has been continued decline since last visit. She is alone at home at night, but during the day her sister stays with her then an aide comes for 5 hours 5 days a week and helps with meals. Erin Wall manages her medications. She does not drive. There is no incontinence, she has difficulty remembering to flush the toilet and needs reminders to bathe. She denies any headaches, dizziness, focal numbness/tingling/weakness. She feels her sleep is "a bit better, I haven't complained." Erin Wall feels she does not sleep well at night, she does not nap during the day, but she notices more agitation and hallucinations if she does not get good sleep. She talks about other people living at home besides her.   History on Initial Assessment 12/04/2018: This is a 76 year old right-handed woman with a history of diet-controlled hypertension, hyperlipidemia, presenting for evaluation of memory loss. Her sister Erin Wall is present during the visit to provide additional information. The patient reports that her memory is not bad, but not as sharp. She lives alone. She denies getting lost  driving. She reports taking medications regularly. She reports missing a bill payment in Feb/March. She forgets names but not very often. She occasionally misplaces things. Her sister provides a different history. Erin Wall reports the patient started having memory changes a year ago. She is more forgetful, worse in the evening hours. She has lost her keys several times. In Feb/March, utilities were cut off, including her electricity, cable, and cell phone, she had bills up to $300-400, family helped her fix this and put everything on draft. She was getting lost driving Erin Wall disclosed this privately). Erin Wall reports she gets very confused when she wakes up from dreams. She states she is having dreams. She has sundowning, getting confused and having difficulty understanding what people are telling her. She knows what she wants to say but gets tangled up getting them out. Over the past 1-2 months, she started having hallucinations, telling her sister that there are children in the house or someone had come or their brother who lives out of town is there. She loses things and says somebody got her shoes and found it in her car. She states there was a bazaar that occurred. She is independent with dressing and bathing, no hygiene concerns. Her father had dementia in his 63s. She denies any history of significant head injuries or alcohol use.  She has urinary frequency. She denies any headaches, dizziness, diplopia, dysarthria, dysphagia, neck/back pain, focal numbness/tingling/weakness, bowel dysfunction. No anosmia, tremors, no falls. Sleep is good. She fell a few months ago on her deck and injured her knee. She states her mood is great. She is a  retired Pharmacist, [employer].  I personally reviewed head CT without contrast done 09/2018 which did not show any acute changes. It was noted that the ventricles are rather prominent compared to sulci. While this finding may be due to atrophy, a degree of  communicating hydrocephalus cannot be excluded on this study.   Prior medications: Donepezil (side effects)  Neuropsychological testing in June 2021 showed prominent areas of weakness surrounding learning and memory, cognitive flexibility, and expressive language. Diagnosis of Major Neurocognitive Disorder (ie dementia) likely due to Alzheimer's disease. It was noted that Lewy body dementia could not be ruled out, she does have hallucinations however did not have any other associated symptoms with intact visuoconstructional abilities on testing.   Laboratory Data: Lab Results  Component Value Date   TSH 0.661 09/23/2018   Lab Results  Component Value Date   [id number] 144 09/23/2018   PAST MEDICAL HISTORY: Past Medical History:  Diagnosis Date   Arthritis 05/09/2012   Borderline diabetes    DVT (deep venous thrombosis) (HCC)    Generalized anxiety disorder    GERD (gastroesophageal reflux disease)    Hematuria 12/07/2014   Hemorrhoids    Hyperlipemia    Hyperlipidemia 05/09/2012   Hypertension    Impaired fasting glucose 11/15/2012   Lung nodules    Lymphocytosis 05/09/2012   Major neurocognitive disorder due to Alzheimer's disease, possible 08/13/2019   Postmenopausal 12/07/2014   Tendonitis    Unilateral primary osteoarthritis, right knee 01/17/2019   Urinary frequency 12/07/2014    MEDICATIONS: Current Outpatient Medications on File Prior to Visit  Medication Sig Dispense Refill   EPINEPHrine 0.3 mg/0.3 mL IJ SOAJ injection Inject 0.3 mLs (0.3 mg total) into the muscle as needed for anaphylaxis. 1 each 0   rivaroxaban (XARELTO) 20 MG TABS tablet Take 20 mg by mouth daily with supper.     donepezil (ARICEPT) 10 MG tablet Take 10 mg by mouth at bedtime. (Patient not taking: Reported on 04/18/2021)     No current facility-administered medications on file prior to visit.    ALLERGIES: Allergies  Allergen Reactions   Pravachol Other (See Comments)    Muscle aches     FAMILY HISTORY: Family History  Problem Relation Age of Onset   Hypertension Mother    Diabetes Mother    Hypertension Father    Diabetes Father    Heart attack Father    Stroke Father    Cancer Brother    Colon cancer Neg Hx    Colon polyps Neg Hx     SOCIAL HISTORY: Social History   Socioeconomic History   Marital status: Single    Spouse name: Not on file   Number of children: Not on file   Years of education: 18   Highest education level: Master's degree (e.g., MA, MS, MEng, MEd, MSW, MBA)  Occupational History   Occupation: retired Pharmacist, hospital   Tobacco Use   Smoking status: Never   Smokeless tobacco: Never  Vaping Use   Vaping Use: Never used  Substance and Sexual Activity   Alcohol use: No   Drug use: No   Sexual activity: Not Currently    Birth control/protection: Surgical, Post-menopausal    Comment: hyst  Other Topics Concern   Not on file  Social History Narrative   Lives one level lives alone   Right handed   Caffeine - not much    Exercise - some   Education - masters   Social Determinants of Health  Financial Resource Strain: Low Risk    Difficulty of Paying Living Expenses: Not hard at all  Food Insecurity: No Food Insecurity   Worried About Charity fundraiser in the Last Year: Never true   Ran Out of Food in the Last Year: Never true  Transportation Needs: No Transportation Needs   Lack of Transportation (Medical): No   Lack of Transportation (Non-Medical): No  Physical Activity: Inactive   Days of Exercise per Week: 0 days   Minutes of Exercise per Session: 0 min  Stress: Stress Concern Present   Feeling of Stress : To some extent  Social Connections: Socially Isolated   Frequency of Communication with Friends and Family: More than three times a week   Frequency of Social Gatherings with Friends and Family: More than three times a week   Attends Religious Services: Never   Marine scientist or Organizations: No   Attends Arts development officer: Never   Marital Status: Never married  Human resources officer Violence: Not At Risk   Fear of Current or Ex-Partner: No   Emotionally Abused: No   Physically Abused: No   Sexually Abused: No     PHYSICAL EXAM: Vitals:   04/18/21 1318  BP: 126/61  Pulse: (!) 102  SpO2: 99%   General: No acute distress Head:  Normocephalic/atraumatic Skin/Extremities: No rash, no edema Neurological Exam: alert and oriented to person, place, city is [place]. Able to say month by guessing from weather. No aphasia or dysarthria. Fund of knowledge is reduced. Recent and remote memory are impaired.  Attention and concentration are normal. MMSE 19/30 MMSE - Mini Mental State Exam 04/18/2021  Orientation to time 1  Orientation to Place 2  Registration 3  Attention/ Calculation 5  Recall 0  Language- name 2 objects 2  Language- repeat 1  Language- follow 3 step command 3  Language- read & follow direction 1  Write a sentence 1  Copy design 0  Total score 19   Cranial nerves: Pupils equal, round. Extraocular movements intact with no nystagmus. Visual fields full.  No facial asymmetry.  Motor: Bulk and tone normal, no cogwheeling, muscle strength 5/5 throughout with no pronator drift.   Finger to nose testing intact.  Gait narrow-based and steady, able to tandem walk adequately.  Romberg negative. No tremors.   IMPRESSION: This is a 76 yo RH woman with a history of diet-controlled hypertension, hyperlipidemia, with Neuropsychological evaluation in June 2021 indicating dementia likely due to Alzheimer's disease. MMSE today 19/30. There was some confusion about her dementia medications, we discussed restarting Rivastigmine 1.'5mg'$  BID, however her sister would like to address sleep difficulties first. We agreed on starting one medication at a time. Start mirtazapine '15mg'$  qhs, update our office in a month and if no side effects, we will restart rivastigmine at that point. Continue close  supervision. Continue control of vascular risk factors, physical exercise, and brain stimulation exercises for overall brain health. Follow-up with [facility] Disorders PA Sharene Butters in 6 months, call for any changes.   Thank you for allowing me to participate in her care.  Please do not hesitate to call for any questions or concerns.    Ellouise Newer, M.D.   CC: Dr. Lacinda Axon

## 2021-04-18 NOTE — Patient Instructions (Signed)
Good to see you. ? ? ?Start mirtazapine '15mg'$ : take 1 tablet every night to help with sleep. Update our office on how you are feeling in a month, if no side effects, we will restart the Rivastigmine 1.'5mg'$  tablet: take 1 tablet twice a day ? ?2. Continue close supervision ? ?3. Follow-up in 6 months, call for any changes ? ? ?FALL PRECAUTIONS: Be cautious when walking. Scan the area for obstacles that may increase the risk of trips and falls. When getting up in the mornings, sit up at the edge of the bed for a few minutes before getting out of bed. Consider elevating the bed at the head end to avoid drop of blood pressure when getting up. Walk always in a well-lit room (use night lights in the walls). Avoid area rugs or power cords from appliances in the middle of the walkways. Use a walker or a cane if necessary and consider physical therapy for balance exercise. Get your eyesight checked regularly. ? ?HOME SAFETY: Consider the safety of the kitchen when operating appliances like stoves, microwave oven, and blender. Consider having supervision and share cooking responsibilities until no longer able to participate in those. Accidents with firearms and other hazards in the house should be identified and addressed as well. ? ?ABILITY TO BE LEFT ALONE: If patient is unable to contact 911 operator, consider using LifeLine, or when the need is there, arrange for someone to stay with patients. Smoking is a fire hazard, consider supervision or cessation. Risk of wandering should be assessed by caregiver and if detected at any point, supervision and safe proof recommendations should be instituted. ? ? ?RECOMMENDATIONS FOR ALL PATIENTS WITH MEMORY PROBLEMS: ?1. Continue to exercise (Recommend 30 minutes of walking everyday, or 3 hours every week) ?2. Increase social interactions - continue going to [place] and enjoy social gatherings with friends and family ?3. Eat healthy, avoid fried foods and eat more fruits and  vegetables ?4. Maintain adequate blood pressure, blood sugar, and blood cholesterol level. Reducing the risk of stroke and cardiovascular disease also helps promoting better memory. ?5. Avoid stressful situations. Live a simple life and avoid aggravations. Organize your time and prepare for the next day in anticipation. ?6. Sleep well, avoid any interruptions of sleep and avoid any distractions in the bedroom that may interfere with adequate sleep quality ?7. Avoid sugar, avoid sweets as there is a strong link between excessive sugar intake, diabetes, and cognitive impairment ?The Mediterranean diet has been shown to help patients reduce the risk of progressive memory disorders and reduces cardiovascular risk. This includes eating fish, eat fruits and green leafy vegetables, nuts like almonds and hazelnuts, walnuts, and also use olive oil. Avoid fast foods and fried foods as much as possible. Avoid sweets and sugar as sugar use has been linked to worsening of memory function. ? ?There is always a concern of gradual progression of memory problems. If this is the case, then we may need to adjust level of care according to patient needs. Support, both to the patient and caregiver, should then be put into place. ? ?

## 2021-04-19 ENCOUNTER — Ambulatory Visit: Payer: Medicare PPO | Admitting: Family Medicine

## 2021-04-19 ENCOUNTER — Encounter: Payer: Self-pay | Admitting: Family Medicine

## 2021-04-19 VITALS — BP 132/86 | HR 89 | Temp 98.5°F | Wt 190.4 lb

## 2021-04-19 DIAGNOSIS — E785 Hyperlipidemia, unspecified: Secondary | ICD-10-CM

## 2021-04-19 DIAGNOSIS — Z130 Encounter for screening for diseases of the blood and blood-forming organs and certain disorders involving the immune mechanism: Secondary | ICD-10-CM | POA: Diagnosis not present

## 2021-04-19 DIAGNOSIS — F0280 Dementia in other diseases classified elsewhere without behavioral disturbance: Secondary | ICD-10-CM | POA: Diagnosis not present

## 2021-04-19 DIAGNOSIS — Z86718 Personal history of other venous thrombosis and embolism: Secondary | ICD-10-CM

## 2021-04-19 DIAGNOSIS — I10 Essential (primary) hypertension: Secondary | ICD-10-CM

## 2021-04-19 DIAGNOSIS — G309 Alzheimer's disease, unspecified: Secondary | ICD-10-CM

## 2021-04-19 NOTE — Progress Notes (Signed)
Subjective:  Patient ID: Erin Wall, female    DOB: [birth year]  Age: 76 y.o. MRN: [id number]  CC: Chief Complaint  Patient presents with   Follow-up    HPI:  76 year old female with Alzheimer disease, hypertension (controlled without pharmacotherapy), osteoarthritis of the knees, hyperlipidemia, history of DVT presents for follow-up.  Patient is accompanied by her sister.  All summer disease is managed by neurology.  She is currently on Remeron.  Rivastigmine is currently being held.  Sister states that they just started the Remeron.  Seems to be helping agitation particularly at night.  Hypertension is fairly well controlled given advanced age without pharmacotherapy.    Patient is not on any medication regarding hyperlipidemia.  Needs labs.  Has a history of unprovoked DVT.  Had a DVT of the left lower extremity and August 2021.  Patient remains on Xarelto.  Will discuss possible discontinuation.  She has some swelling of the lower extremities bilaterally.  Improves with compression stockings/socks.  Patient Active Problem List   Diagnosis Date Noted   Alzheimer disease ([place]) 05/27/2020   History of DVT of lower extremity 05/27/2020   S/P hysterectomy 05/27/2020   Unilateral primary osteoarthritis, right knee 01/17/2019   Hyperlipidemia 05/09/2012   Hypertension 05/09/2012    Social Hx   Social History   Socioeconomic History   Marital status: Single    Spouse name: Not on file   Number of children: Not on file   Years of education: 18   Highest education level: Master's degree (e.g., MA, MS, MEng, MEd, MSW, MBA)  Occupational History   Occupation: retired Pharmacist, hospital   Tobacco Use   Smoking status: Never   Smokeless tobacco: Never  Vaping Use   Vaping Use: Never used  Substance and Sexual Activity   Alcohol use: No   Drug use: No   Sexual activity: Not Currently    Birth control/protection: Surgical, Post-menopausal    Comment: hyst  Other Topics Concern    Not on file  Social History Narrative   Lives one level lives alone   Right handed   Caffeine - not much    Exercise - some   Education - masters   Social Determinants of Health   Financial Resource Strain: Low Risk    Difficulty of Paying Living Expenses: Not hard at all  Food Insecurity: No Food Insecurity   Worried About Charity fundraiser in the Last Year: Never true   Arboriculturist in the Last Year: Never true  Transportation Needs: No Transportation Needs   Lack of Transportation (Medical): No   Lack of Transportation (Non-Medical): No  Physical Activity: Inactive   Days of Exercise per Week: 0 days   Minutes of Exercise per Session: 0 min  Stress: Stress Concern Present   Feeling of Stress : To some extent  Social Connections: Socially Isolated   Frequency of Communication with Friends and Family: More than three times a week   Frequency of Social Gatherings with Friends and Family: More than three times a week   Attends Religious Services: Never   Marine scientist or Organizations: No   Attends Archivist Meetings: Never   Marital Status: Never married    Review of Systems Per HPI  Objective:  BP 132/86    Pulse 89    Temp 98.5 F (36.9 C)    Wt 190 lb 6.4 oz (86.4 kg)    SpO2 99%    BMI 26.56  kg/m   BP/Weight 04/19/2021 05/21/1857 0/10/3110  Systolic BP 162 446 -  Diastolic BP 86 61 -  Wt. (Lbs) 190.4 190.2 179  BMI 26.56 26.53 25.68    Physical Exam Constitutional:      General: She is not in acute distress.    Appearance: Normal appearance.  HENT:     Head: Normocephalic and atraumatic.  Eyes:     General:        Right eye: No discharge.     Conjunctiva/sclera: Conjunctivae normal.  Cardiovascular:     Rate and Rhythm: Normal rate and regular rhythm.  Pulmonary:     Effort: Pulmonary effort is normal.     Breath sounds: Normal breath sounds. No wheezing, rhonchi or rales.  Neurological:     Mental Status: She is alert.   Psychiatric:        Mood and Affect: Mood normal.        Behavior: Behavior normal.    Lab Results  Component Value Date   WBC 3.9 03/26/2020   HGB 14.1 03/26/2020   HCT 42.7 03/26/2020   PLT 366 03/26/2020   GLUCOSE 99 03/26/2020   CHOL 210 (H) 03/26/2020   TRIG 59 03/26/2020   HDL 65 03/26/2020   LDLCALC 134 (H) 03/26/2020   ALT 18 03/26/2020   AST 23 03/26/2020   NA 140 03/26/2020   K 4.0 03/26/2020   CL 104 03/26/2020   CREATININE 1.06 (H) 03/26/2020   BUN 13 03/26/2020   CO2 21 03/26/2020   TSH 0.661 09/23/2018   HGBA1C 5.7 (H) 11/10/2016     Assessment & Plan:   Problem List Items Addressed This Visit       Cardiovascular and Mediastinum   Hypertension    BP is stable.  No pharmacotherapy at this time.  We will continue to monitor closely.      Relevant Orders   CMP14+EGFR     Nervous and Auditory   Alzheimer disease ([place])    Managed by neurology.  Continue Remeron.        Other   Hyperlipidemia - Primary    Lipid panel for further evaluation today.      Relevant Orders   Lipid panel   History of DVT of lower extremity    Will discuss case with hematology/oncology.  May need further work-up.  Will await their recommendations.      Other Visit Diagnoses     Screening for deficiency anemia       Relevant Orders   CBC      Follow-up: 3 to 6 months.  [place]

## 2021-04-19 NOTE — Assessment & Plan Note (Signed)
Will discuss case with hematology/oncology.  May need further work-up.  Will await their recommendations. ?

## 2021-04-19 NOTE — Assessment & Plan Note (Signed)
Lipid panel for further evaluation today. ?

## 2021-04-19 NOTE — Assessment & Plan Note (Signed)
BP is stable.  No pharmacotherapy at this time.  We will continue to monitor closely. ?

## 2021-04-19 NOTE — Assessment & Plan Note (Signed)
Managed by neurology.  Continue Remeron. ?

## 2021-04-19 NOTE — Patient Instructions (Addendum)
Labs today.  ? ?I am going to reach out to Hematology/Oncology and get his input. ? ?Follow up in 3-6 months (I will be in touch regarding what he says). ? ?Take care ? ?Dr. Lacinda Axon  ?

## 2021-04-20 LAB — CMP14+EGFR
ALT: 23 IU/L (ref 0–32)
AST: 24 IU/L (ref 0–40)
Albumin/Globulin Ratio: 1.6 (ref 1.2–2.2)
Albumin: 3.9 g/dL (ref 3.7–4.7)
Alkaline Phosphatase: 64 IU/L (ref 44–121)
BUN/Creatinine Ratio: 10 — ABNORMAL LOW (ref 12–28)
BUN: 11 mg/dL (ref 8–27)
Bilirubin Total: 0.3 mg/dL (ref 0.0–1.2)
CO2: 25 mmol/L (ref 20–29)
Calcium: 9.7 mg/dL (ref 8.7–10.3)
Chloride: 107 mmol/L — ABNORMAL HIGH (ref 96–106)
Creatinine, Ser: 1.14 mg/dL — ABNORMAL HIGH (ref 0.57–1.00)
Globulin, Total: 2.4 g/dL (ref 1.5–4.5)
Glucose: 87 mg/dL (ref 70–99)
Potassium: 5.5 mmol/L — ABNORMAL HIGH (ref 3.5–5.2)
Sodium: 141 mmol/L (ref 134–144)
Total Protein: 6.3 g/dL (ref 6.0–8.5)
eGFR: 50 mL/min/{1.73_m2} — ABNORMAL LOW (ref >59)

## 2021-04-20 LAB — CBC
Hematocrit: 42.5 % (ref 34.0–46.6)
Hemoglobin: 14 g/dL (ref 11.1–15.9)
MCH: 28.6 pg (ref 26.6–33.0)
MCHC: 32.9 g/dL (ref 31.5–35.7)
MCV: 87 fL (ref 79–97)
Platelets: 315 10*3/uL (ref 150–450)
RBC: 4.9 x10E6/uL (ref 3.77–5.28)
RDW: 13.7 % (ref 11.7–15.4)
WBC: 5.1 10*3/uL (ref 3.4–10.8)

## 2021-04-20 LAB — LIPID PANEL
Chol/HDL Ratio: 3 ratio (ref 0.0–4.4)
Cholesterol, Total: 204 mg/dL — ABNORMAL HIGH (ref 100–199)
HDL: 68 mg/dL (ref >39)
LDL Chol Calc (NIH): 127 mg/dL — ABNORMAL HIGH (ref 0–99)
Triglycerides: 52 mg/dL (ref 0–149)
VLDL Cholesterol Cal: 9 mg/dL (ref 5–40)

## 2021-04-21 ENCOUNTER — Other Ambulatory Visit: Payer: Self-pay

## 2021-04-21 DIAGNOSIS — E875 Hyperkalemia: Secondary | ICD-10-CM

## 2021-04-21 NOTE — Progress Notes (Signed)
Lab results given to patient per instructions. ?

## 2021-04-25 ENCOUNTER — Telehealth: Payer: Self-pay | Admitting: Physician Assistant

## 2021-04-25 NOTE — Telephone Encounter (Signed)
Pt's sister called in stating the mirtazapine is keeping the patient up. She can't get her to sleep and she is confused almost everyday around 3:00. ?

## 2021-04-27 NOTE — Telephone Encounter (Signed)
If she is not having side effects on mirtazapine '15mg'$ , pls have them increase to 2 tablets every night. Thanks ?

## 2021-04-27 NOTE — Telephone Encounter (Signed)
Advised to caregiver to increase dose. To call and update office status in a few weeks.  ?

## 2021-04-27 NOTE — Telephone Encounter (Signed)
Patients sister called back to follow up. ?

## 2021-04-27 NOTE — Telephone Encounter (Signed)
Patient is on mirtazapine, she is staying busy as her sister says. Pacing the floors and folding clothes, etc. Up late want lay down in bed. Confusion around 3 every day. No fever, or urinary symptoms, wanted me to route to you. Also note, she had labs done from PCP. She said,"to let you know". Please advise  ?

## 2021-04-28 DIAGNOSIS — E875 Hyperkalemia: Secondary | ICD-10-CM | POA: Diagnosis not present

## 2021-04-29 LAB — BASIC METABOLIC PANEL
BUN/Creatinine Ratio: 13 (ref 12–28)
BUN: 13 mg/dL (ref 8–27)
CO2: 24 mmol/L (ref 20–29)
Calcium: 9.7 mg/dL (ref 8.7–10.3)
Chloride: 105 mmol/L (ref 96–106)
Creatinine, Ser: 0.97 mg/dL (ref 0.57–1.00)
Glucose: 93 mg/dL (ref 70–99)
Potassium: 4.6 mmol/L (ref 3.5–5.2)
Sodium: 140 mmol/L (ref 134–144)
eGFR: 61 mL/min/{1.73_m2} (ref >59)

## 2021-06-07 ENCOUNTER — Ambulatory Visit: Payer: Medicare PPO | Admitting: Family Medicine

## 2021-06-07 ENCOUNTER — Encounter: Payer: Self-pay | Admitting: Family Medicine

## 2021-06-07 VITALS — BP 127/78 | HR 85 | Temp 97.3°F | Ht 71.0 in | Wt 210.0 lb

## 2021-06-07 DIAGNOSIS — G309 Alzheimer's disease, unspecified: Secondary | ICD-10-CM

## 2021-06-07 DIAGNOSIS — R410 Disorientation, unspecified: Secondary | ICD-10-CM

## 2021-06-07 DIAGNOSIS — R600 Localized edema: Secondary | ICD-10-CM | POA: Insufficient documentation

## 2021-06-07 DIAGNOSIS — F0280 Dementia in other diseases classified elsewhere without behavioral disturbance: Secondary | ICD-10-CM

## 2021-06-07 LAB — POCT URINALYSIS DIP (MANUAL ENTRY)
Bilirubin, UA: NEGATIVE
Glucose, UA: NEGATIVE mg/dL
Ketones, POC UA: NEGATIVE mg/dL
Leukocytes, UA: NEGATIVE
Nitrite, UA: NEGATIVE
Spec Grav, UA: 1.01 (ref 1.010–1.025)
Urobilinogen, UA: 0.2 E.U./dL
pH, UA: 7 (ref 5.0–8.0)

## 2021-06-07 NOTE — Assessment & Plan Note (Signed)
Patient's recent confusion per caregiver is due to underlying Alzheimer disease.  She is stable at this time.  Urinalysis with no evidence of infection.  Advise follow-up with neurology. ?

## 2021-06-07 NOTE — Progress Notes (Signed)
? ?Subjective:  ?Patient ID: Erin Wall, female    DOB: Nov 08, 1945  Age: 76 y.o. MRN: [id number] ? ?CC: ?Chief Complaint  ?Patient presents with  ? increased confusion  ?  Since last week - inability to express with words   ? ? ?HPI: ? ?76 year old female presents for evaluation of the above. ? ?Patient has underlying Alzheimer disease.  Managed by neurology.  Caregiver states that since last week she seems to be slightly more confused.  Caregiver states that she seems to have difficulty expressing her thoughts at times and sometimes they do not make sense.  This has been worse over the past week.  Caregiver has also noticed some increase in going to the bathroom.  She is concerned that she may have a UTI.  Additionally, patient has been experiencing worsening lower extremity edema.  No reports of shortness of breath.  No other complaints. ? ?Patient Active Problem List  ? Diagnosis Date Noted  ? Lower extremity edema 06/07/2021  ? Alzheimer disease ([place]) 05/27/2020  ? History of DVT of lower extremity 05/27/2020  ? S/P hysterectomy 05/27/2020  ? Unilateral primary osteoarthritis, right knee 01/17/2019  ? Hyperlipidemia 05/09/2012  ? Hypertension 05/09/2012  ? ? ?Social Hx   ?Social History  ? ?Socioeconomic History  ? Marital status: Single  ?  Spouse name: Not on file  ? Number of children: Not on file  ? Years of education: 35  ? Highest education level: Master's degree (e.g., MA, MS, MEng, MEd, MSW, MBA)  ?Occupational History  ? Occupation: retired Pharmacist, hospital   ?Tobacco Use  ? Smoking status: Never  ? Smokeless tobacco: Never  ?Vaping Use  ? Vaping Use: Never used  ?Substance and Sexual Activity  ? Alcohol use: No  ? Drug use: No  ? Sexual activity: Not Currently  ?  Birth control/protection: Surgical, Post-menopausal  ?  Comment: hyst  ?Other Topics Concern  ? Not on file  ?Social History Narrative  ? Lives one level lives alone  ? Right handed  ? Caffeine - not much   ? Exercise - some  ? Education -  masters  ? ?Social Determinants of Health  ? ?Financial Resource Strain: Not on file  ?Food Insecurity: Not on file  ?Transportation Needs: Not on file  ?Physical Activity: Not on file  ?Stress: Not on file  ?Social Connections: Not on file  ? ? ?Review of Systems ?Per HPI ? ?Objective:  ?BP 127/78   Pulse 85   Temp (!) 97.3 ?F (36.3 ?C)   Ht '5\' 11"'$  (1.803 m)   Wt 210 lb (95.3 kg)   SpO2 99%   BMI 29.29 kg/m?  ? ? ?  06/07/2021  ? 10:40 AM 04/19/2021  ?  8:27 AM 04/18/2021  ?  1:18 PM  ?BP/Weight  ?Systolic BP 371 696 789  ?Diastolic BP 78 86 61  ?Wt. (Lbs) 210 190.4 190.2  ?BMI 29.29 kg/m2 26.56 kg/m2 26.53 kg/m2  ? ? ?Physical Exam ?Vitals and nursing note reviewed.  ?Constitutional:   ?   General: She is not in acute distress. ?   Appearance: Normal appearance. She is not ill-appearing.  ?HENT:  ?   Head: Normocephalic and atraumatic.  ?Eyes:  ?   General:     ?   Right eye: No discharge.  ?   Conjunctiva/sclera: Conjunctivae normal.  ?Cardiovascular:  ?   Comments: 2+ pitting lower extremity edema. ?Pulmonary:  ?   Effort: Pulmonary effort is normal. No  respiratory distress.  ?Neurological:  ?   Mental Status: She is alert.  ?Psychiatric:     ?   Mood and Affect: Mood normal.     ?   Behavior: Behavior normal.  ? ? ?Lab Results  ?Component Value Date  ? WBC 5.1 04/19/2021  ? HGB 14.0 04/19/2021  ? HCT 42.5 04/19/2021  ? PLT 315 04/19/2021  ? GLUCOSE 93 04/28/2021  ? CHOL 204 (H) 04/19/2021  ? TRIG 52 04/19/2021  ? HDL 68 04/19/2021  ? LDLCALC 127 (H) 04/19/2021  ? ALT 23 04/19/2021  ? AST 24 04/19/2021  ? NA 140 04/28/2021  ? K 4.6 04/28/2021  ? CL 105 04/28/2021  ? CREATININE 0.97 04/28/2021  ? BUN 13 04/28/2021  ? CO2 24 04/28/2021  ? TSH 0.661 09/23/2018  ? HGBA1C 5.7 (H) 11/10/2016  ? ? ? ?Assessment & Plan:  ? ?Problem List Items Addressed This Visit   ? ?  ? Nervous and Auditory  ? Alzheimer disease ([place]) - Primary  ?  Patient's recent confusion per caregiver is due to underlying Alzheimer disease.  She  is stable at this time.  Urinalysis with no evidence of infection.  Advise follow-up with neurology. ? ?  ?  ?  ? Other  ? Lower extremity edema  ?  Advised elevation and use of compression stockings. ? ?  ?  ? ?Other Visit Diagnoses   ? ? Confusion      ? Relevant Orders  ? POCT urinalysis dipstick (Completed)  ? ?  ? ?Thersa Salt DO ?[place] ? ?

## 2021-06-07 NOTE — Patient Instructions (Signed)
Elevate legs and wear compression stockings. ? ?Call with concerns. ? ?Take care ? ?Dr. Lacinda Axon  ?

## 2021-06-07 NOTE — Assessment & Plan Note (Signed)
Advised elevation and use of compression stockings. ?

## 2021-06-18 IMAGING — DX DG SHOULDER 2+V*R*
3 series · 3 of 3 positions shown · non-contrast
Comparison: None.

CLINICAL DATA: Fall a little over 1 month ago. Right shoulder pain.

EXAM:
RIGHT SHOULDER - 2+ VIEW

[shoulder grashey]
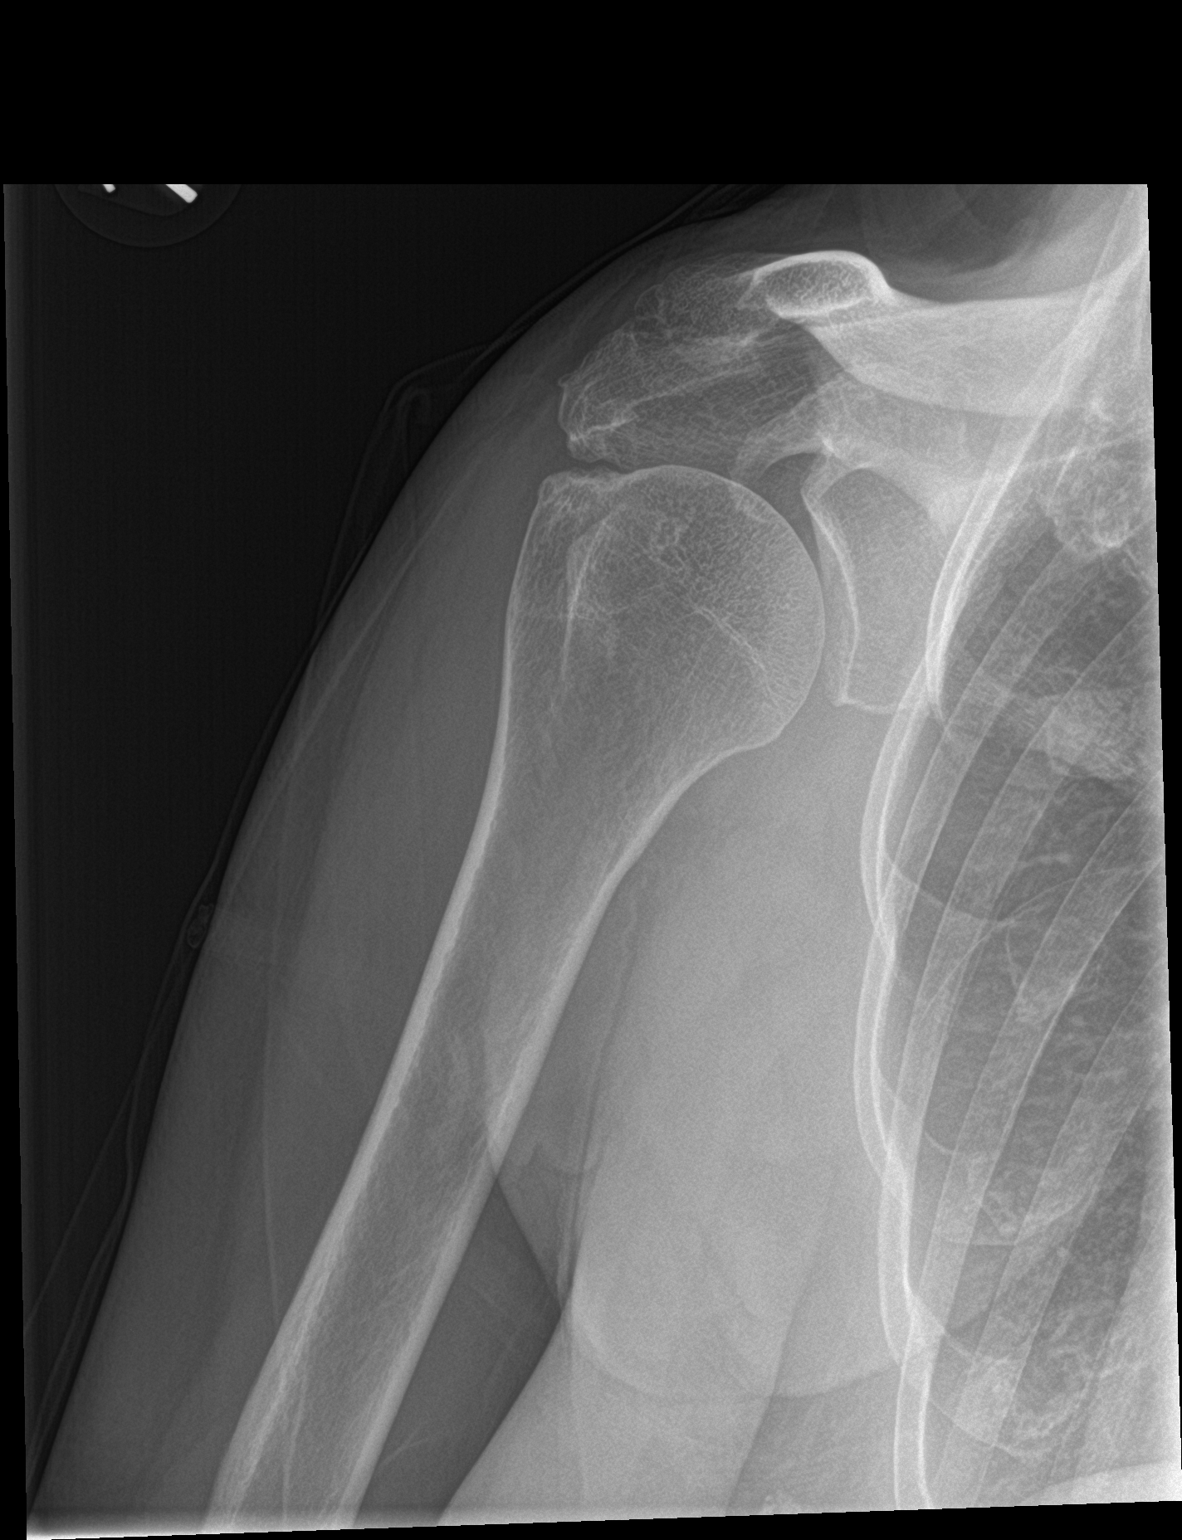

[shoulder y view]
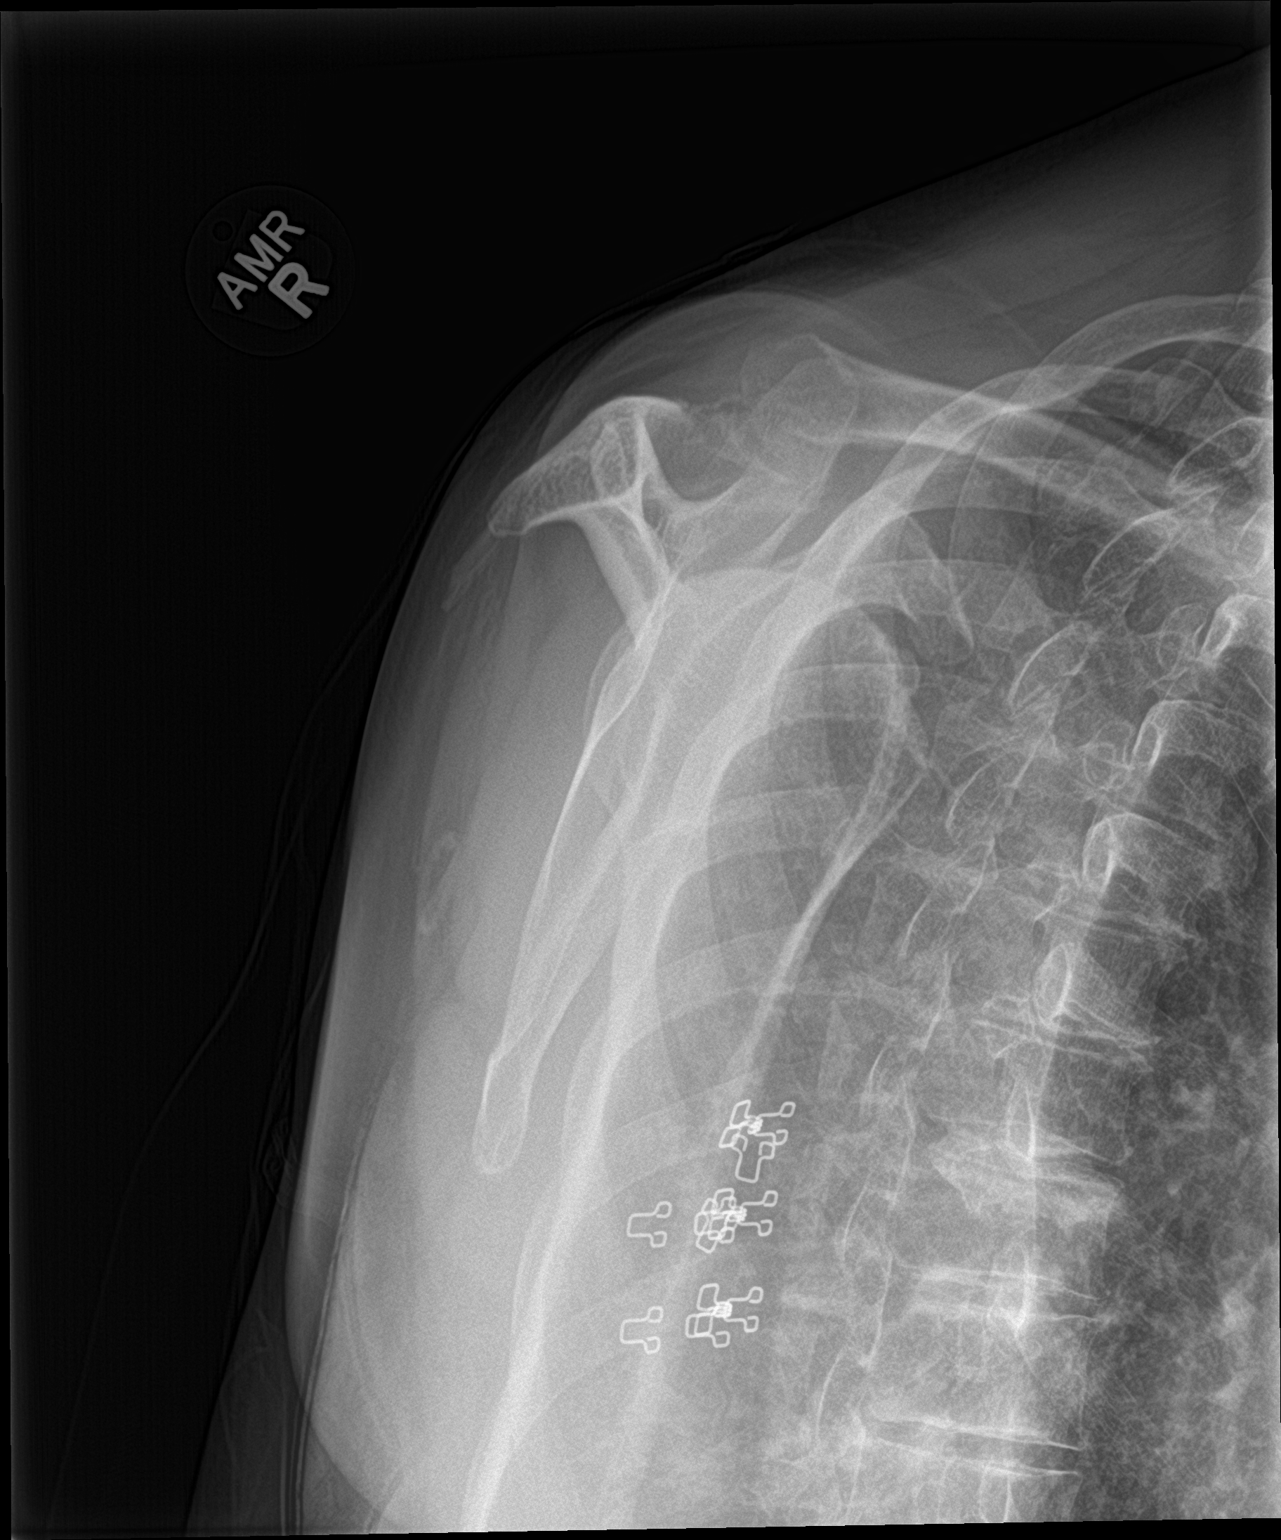

[shoulder axillary]
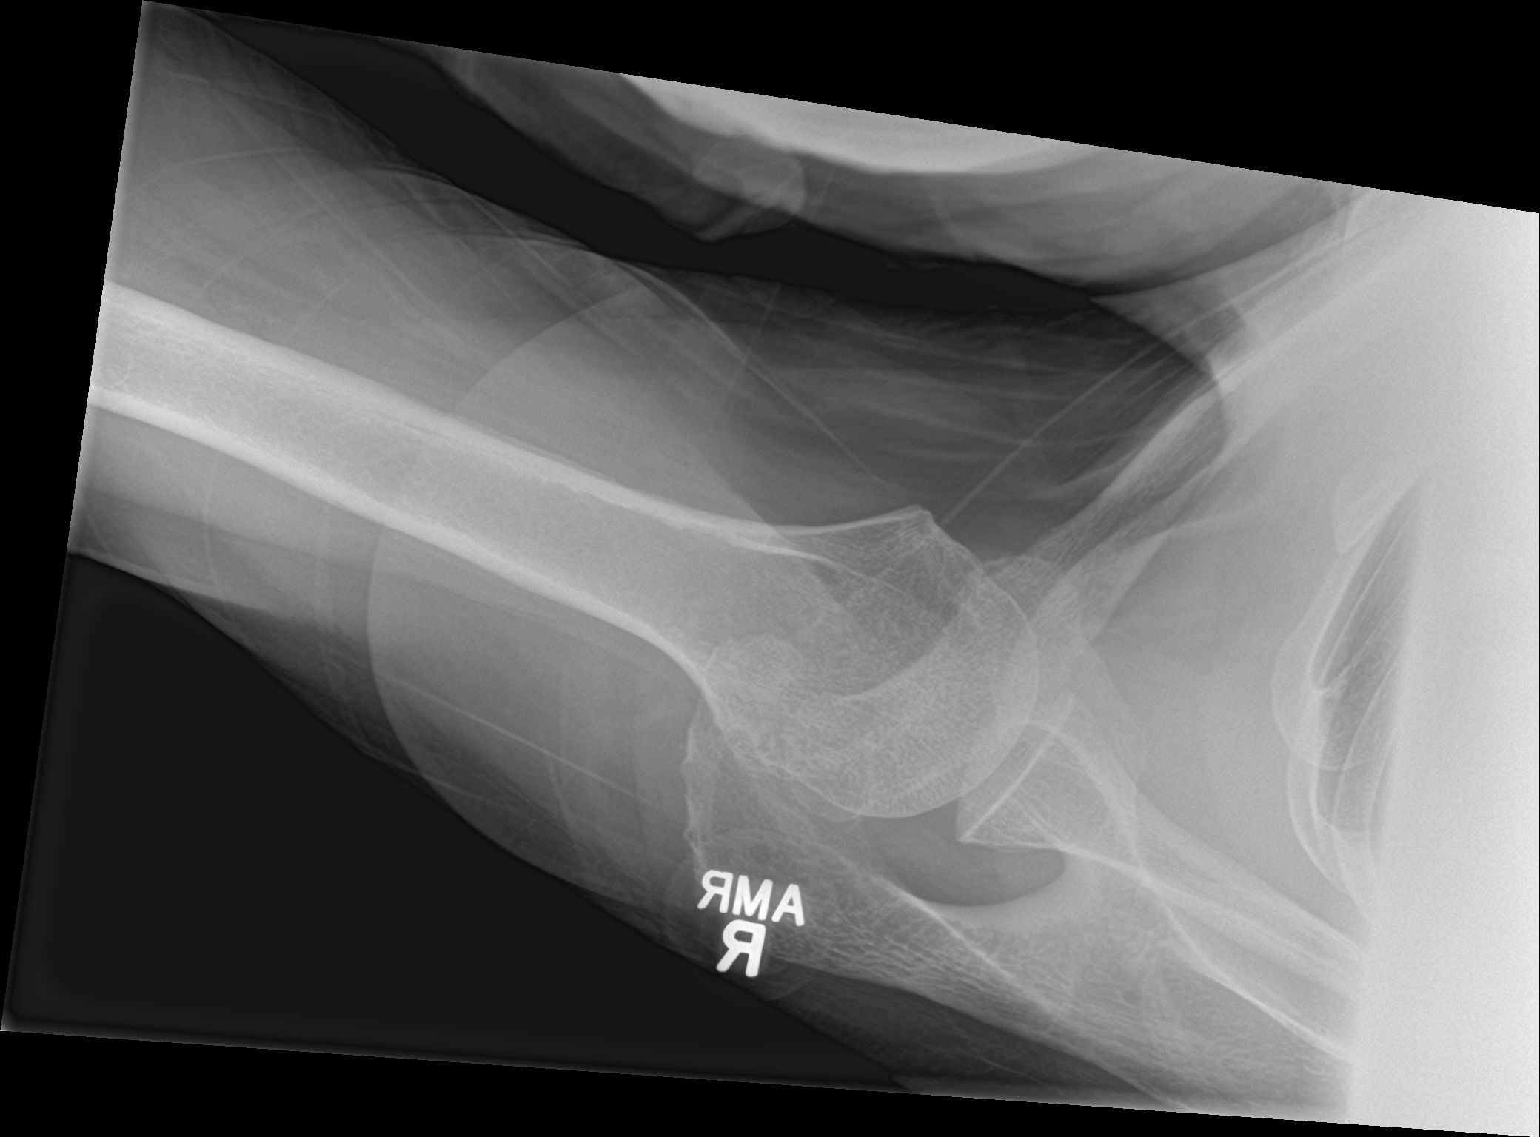

[3 of 3 positions shown; findings below may reference images not displayed]

FINDINGS: No fracture.  No bone lesion.

Glenohumeral joint normally spaced and aligned.

Mild AC joint osteoarthritis.

Soft tissues are unremarkable.
IMPRESSION: 1. No fracture or dislocation.  No bone lesion.
2. Mild AC joint osteoarthritis.

## 2021-07-29 ENCOUNTER — Other Ambulatory Visit: Payer: Self-pay | Admitting: Family Medicine

## 2021-08-10 ENCOUNTER — Telehealth: Payer: Self-pay | Admitting: Physician Assistant

## 2021-08-10 NOTE — Telephone Encounter (Signed)
Patient is acting out and is having anxiety  and Mrs Erin Wall wants to know if we can give her something for her anxiety per her Voicemail she left

## 2021-08-10 NOTE — Telephone Encounter (Signed)
Spoke to patients sister and she is behaving very violent and really being difficult with helpers that come into the house to help patient very angry

## 2021-08-11 ENCOUNTER — Ambulatory Visit: Payer: Medicare PPO | Admitting: Nurse Practitioner

## 2021-08-11 ENCOUNTER — Encounter: Payer: Self-pay | Admitting: Nurse Practitioner

## 2021-08-11 VITALS — BP 146/92 | HR 85 | Temp 98.1°F | Ht 71.0 in | Wt 212.2 lb

## 2021-08-11 DIAGNOSIS — F0280 Dementia in other diseases classified elsewhere without behavioral disturbance: Secondary | ICD-10-CM

## 2021-08-11 DIAGNOSIS — G309 Alzheimer's disease, unspecified: Secondary | ICD-10-CM

## 2021-08-11 DIAGNOSIS — B351 Tinea unguium: Secondary | ICD-10-CM

## 2021-08-11 DIAGNOSIS — R350 Frequency of micturition: Secondary | ICD-10-CM

## 2021-08-11 LAB — POCT URINALYSIS DIPSTICK
Bilirubin, UA: NEGATIVE
Glucose, UA: NEGATIVE
Ketones, UA: NEGATIVE
Leukocytes, UA: NEGATIVE
Nitrite, UA: NEGATIVE
Protein, UA: NEGATIVE
Spec Grav, UA: 1.01 (ref 1.010–1.025)
Urobilinogen, UA: 0.2 E.U./dL
pH, UA: 6 (ref 5.0–8.0)

## 2021-08-11 MED ORDER — AMOXICILLIN-POT CLAVULANATE 875-125 MG PO TABS: 1.0000 | ORAL_TABLET | Freq: Two times a day (BID) | ORAL | 0 refills | Status: AC

## 2021-08-11 NOTE — Progress Notes (Signed)
Subjective:    Patient ID: Erin Wall, female    DOB: 1945/04/04, 76 y.o.   MRN: [id number]  HPI  76 year old with history of Alzheimer's presents to clinic with sister with complaints of urinary frequency for couple of months.  Sister also concerned that patient has been having more frequent mood swings and anger issues for several weeks.  The sister states that the patient's aides have also noticed her mood changes and increased irritability.  Sisters states that patient is frequently cold and that she occasionally does have urinary urgency.  Patient denies any urinary odor, blood in her urine, back pain, fever, body aches.   Review of Systems  All other systems reviewed and are negative.      Objective:   Physical Exam Vitals reviewed.  Constitutional:      General: She is not in acute distress.    Appearance: Normal appearance. She is normal weight. She is not ill-appearing, toxic-appearing or diaphoretic.  HENT:     Head: Normocephalic and atraumatic.  Cardiovascular:     Rate and Rhythm: Normal rate and regular rhythm.     Pulses: Normal pulses.     Heart sounds: Normal heart sounds. No murmur heard. Pulmonary:     Effort: Pulmonary effort is normal. No respiratory distress.     Breath sounds: Normal breath sounds. No wheezing.  Abdominal:     General: Abdomen is flat. Bowel sounds are normal. There is no distension.     Palpations: Abdomen is soft. There is no mass.     Tenderness: There is no abdominal tenderness. There is left CVA tenderness. There is no right CVA tenderness, guarding or rebound.     Hernia: No hernia is present.  Musculoskeletal:     Comments: Grossly intact  Skin:    General: Skin is warm.     Capillary Refill: Capillary refill takes less than 2 seconds.     Comments: Thickened, overgrown nails.   Neurological:     Mental Status: She is alert.     Comments: Grossly intact  Psychiatric:        Attention and Perception: She is  inattentive.        Mood and Affect: Mood normal. Affect is blunt.        Speech: Speech normal.        Behavior: Behavior normal. Behavior is cooperative.        Cognition and Memory: Cognition is impaired.           Assessment & Plan:   1. Urinary frequency -We will treat for urinary tract infection prophylactically -No leukocytes or nitrites found on point-of-care and urinary testing however mild blood was noted. -We will send urine off for culture. - POCT urinalysis dipstick - Urine Culture - amoxicillin-clavulanate (AUGMENTIN) 875-125 MG tablet; Take 1 tablet by mouth 2 (two) times daily for 5 days.  Dispense: 10 tablet; Refill: 0 -Symptoms should be better within 2 to 3 days of antibiotic use.  If symptoms not better return to clinic.  2. Onychomycosis - Ambulatory referral to Podiatry for nail trimming  3. Alzheimer disease ([place]) -Possible that UTI could be causing mood.  We will treat UTI prophylactically and send urine out for culture -If urine culture results as negative will have patient follow-up with Dr. Lacinda Axon or neurology for further evaluation    Note:  This document was prepared using Dragon voice recognition software and may include unintentional dictation errors. Note - This record has been  created using [employer].  Chart creation errors have been sought, but may not always  have been located. Such creation errors do not reflect on  the standard of medical care.

## 2021-08-11 NOTE — Telephone Encounter (Signed)
Called patients sister and she is calling PCP today to get patient screened and on some medication

## 2021-08-13 LAB — URINE CULTURE

## 2021-09-19 ENCOUNTER — Ambulatory Visit: Payer: Medicare PPO | Admitting: Podiatry

## 2021-09-19 DIAGNOSIS — B351 Tinea unguium: Secondary | ICD-10-CM

## 2021-09-19 NOTE — Progress Notes (Signed)
   Chief Complaint  Patient presents with   Nail Problem    Onychomycosis    Subjective: 76 y.o. female presenting today with her sister for evaluation of discoloration with thickening to the bilateral toenails and she is concerned for toenail fungus.  This has been ongoing for a few years now.  Patient states that recently she just had a pedicure which provided some relief.  She presents for further treatment and evaluation  Past Medical History:  Diagnosis Date   Arthritis 05/09/2012   Borderline diabetes    DVT (deep venous thrombosis) (HCC)    Generalized anxiety disorder    GERD (gastroesophageal reflux disease)    Hematuria 12/07/2014   Hemorrhoids    Hyperlipemia    Hyperlipidemia 05/09/2012   Hypertension    Impaired fasting glucose 11/15/2012   Lung nodules    Lymphocytosis 05/09/2012   Major neurocognitive disorder due to Alzheimer's disease, possible 08/13/2019   Postmenopausal 12/07/2014   Tendonitis    Unilateral primary osteoarthritis, right knee 01/17/2019   Urinary frequency 12/07/2014    Past Surgical History:  Procedure Laterality Date   ABDOMINAL HYSTERECTOMY     BACTERIAL OVERGROWTH TEST  01/01/2012   Procedure: BACTERIAL OVERGROWTH TEST;  Surgeon: Danie Binder, MD;  Location: [facility];  Service: Endoscopy;  Laterality: N/A;  7:30   BREAST BIOPSY     BREAST EXCISIONAL BIOPSY Left 1978   benign   BREAST SURGERY     tumor removed   COLONOSCOPY  2004   COLONOSCOPY  09/20/2010   IEP:PIRJJOAC polyps,internal hemorrhoids/polypoid lesion in the cecum   ESOPHAGOGASTRODUODENOSCOPY  09/20/2010   ZYS:AYTKZSWFU/XNAT gastritis    Allergies  Allergen Reactions   Pravachol Other (See Comments)    Muscle aches    Objective: Physical Exam General: The patient is alert and oriented x3 in no acute distress.  Dermatology: Hyperkeratotic, discolored, thickened, onychodystrophy noted 1-5 bilateral. Skin is warm, dry and supple bilateral lower extremities.  Negative for open lesions or macerations.  Vascular: Palpable pedal pulses bilaterally. No edema or erythema noted. Capillary refill within normal limits.  Neurological: Epicritic and protective threshold grossly intact bilaterally.   Musculoskeletal Exam: No pedal deformity noted  Assessment: #1 Onychomycosis of toenails  Plan of Care:  #1 Patient was evaluated. #2  Today we discussed different treatment options including oral, topical, and laser antifungal treatment modalities.  We discussed their efficacies and side effects.  Patient opts for topical antifungal #3  Formula 3 topical antifungal was dispensed at checkout #4 return to clinic as needed   Edrick Kins, DPM [facility]  Dr. Edrick Kins, DPM    [address], [place] [address]                Office [phone]  Fax [phone]

## 2021-10-14 ENCOUNTER — Ambulatory Visit
Admission: EM | Admit: 2021-10-14 | Discharge: 2021-10-14 | Disposition: A | Discharge status: Home / Self Care | Payer: Medicare PPO | Attending: Family Medicine | Admitting: Family Medicine

## 2021-10-14 ENCOUNTER — Other Ambulatory Visit: Payer: Self-pay

## 2021-10-14 ENCOUNTER — Encounter: Payer: Self-pay | Admitting: Emergency Medicine

## 2021-10-14 DIAGNOSIS — R600 Localized edema: Secondary | ICD-10-CM

## 2021-10-14 DIAGNOSIS — R4689 Other symptoms and signs involving appearance and behavior: Secondary | ICD-10-CM

## 2021-10-14 DIAGNOSIS — R4182 Altered mental status, unspecified: Secondary | ICD-10-CM

## 2021-10-14 LAB — POCT URINALYSIS DIP (MANUAL ENTRY)
Bilirubin, UA: NEGATIVE
Glucose, UA: NEGATIVE mg/dL
Ketones, POC UA: NEGATIVE mg/dL
Leukocytes, UA: NEGATIVE
Nitrite, UA: NEGATIVE
Protein Ur, POC: NEGATIVE mg/dL
Spec Grav, UA: 1.015 (ref 1.010–1.025)
Urobilinogen, UA: 0.2 E.U./dL
pH, UA: 7 (ref 5.0–8.0)

## 2021-10-14 LAB — POCT FASTING CBG KUC MANUAL ENTRY: POCT Glucose (KUC): 91 mg/dL (ref 70–99)

## 2021-10-14 NOTE — ED Triage Notes (Signed)
Pt family reports increased confusion and agitation since Wednesday. Denies any known injury but reports wanted to see if pt has UTI.

## 2021-10-14 NOTE — ED Provider Notes (Signed)
[facility]    CSN: [id number] Arrival date & time: 10/14/21  1837      History   Chief Complaint Chief Complaint  Patient presents with   Altered Mental Status    HPI Erin Wall is a 76 y.o. female.   Patient presenting today with her daughter for evaluation of 2-day history of increased confusion, agitation and combative behaviors.  Daughter is requesting that she be checked for urinary tract infection in case this may be causing her symptoms.  She does have a history of Alzheimer's at baseline but daughter states these behaviors are out of character for her.  They deny any known fevers, chills, body aches, cough, chest pain, shortness of breath, dizziness, abdominal pain, nausea vomiting or diarrhea.  Daughter does note that her legs have been staying may be a bit more swollen than usual because she is refusing to sleep in her bed currently and sleeping with her legs down on the floor.  She does have a history of chronic lower extremity edema, has compression stockings but does not wear them.    Past Medical History:  Diagnosis Date   Arthritis 05/09/2012   Borderline diabetes    DVT (deep venous thrombosis) (HCC)    Generalized anxiety disorder    GERD (gastroesophageal reflux disease)    Hematuria 12/07/2014   Hemorrhoids    Hyperlipemia    Hyperlipidemia 05/09/2012   Hypertension    Impaired fasting glucose 11/15/2012   Lung nodules    Lymphocytosis 05/09/2012   Major neurocognitive disorder due to Alzheimer's disease, possible 08/13/2019   Postmenopausal 12/07/2014   Tendonitis    Unilateral primary osteoarthritis, right knee 01/17/2019   Urinary frequency 12/07/2014    Patient Active Problem List   Diagnosis Date Noted   Lower extremity edema 06/07/2021   Alzheimer disease ([place]) 05/27/2020   History of DVT of lower extremity 05/27/2020   S/P hysterectomy 05/27/2020   Unilateral primary osteoarthritis, right knee 01/17/2019   Hyperlipidemia  05/09/2012   Hypertension 05/09/2012    Past Surgical History:  Procedure Laterality Date   ABDOMINAL HYSTERECTOMY     BACTERIAL OVERGROWTH TEST  01/01/2012   Procedure: BACTERIAL OVERGROWTH TEST;  Surgeon: Danie Binder, MD;  Location: [facility];  Service: Endoscopy;  Laterality: N/A;  7:30   BREAST BIOPSY     BREAST EXCISIONAL BIOPSY Left 1978   benign   BREAST SURGERY     tumor removed   COLONOSCOPY  2004   COLONOSCOPY  09/20/2010   TIR:[id number] polyps,internal hemorrhoids/polypoid lesion in the cecum   ESOPHAGOGASTRODUODENOSCOPY  09/20/2010   QQP:[id number]/ZTIW gastritis    OB History     Gravida  0   Para  0   Term  0   Preterm  0   AB  0   Living  0      SAB  0   IAB  0   Ectopic  0   Multiple  0   Live Births               Home Medications    Prior to Admission medications   Medication Sig Start Date End Date Taking? Authorizing Provider  EPINEPHrine 0.3 mg/0.3 mL IJ SOAJ injection Inject 0.3 mLs (0.3 mg total) into the muscle as needed for anaphylaxis. 09/15/19   Lucrezia Starch, MD  mirtazapine (REMERON) 15 MG tablet Take 1 tablet (15 mg total) by mouth at bedtime. 04/18/21   Cameron Sprang, MD  rivastigmine (EXELON) 1.5 MG capsule Take 1 capsule (1.5 mg total) by mouth 2 (two) times daily. 04/18/21   Cameron Sprang, MD  XARELTO 20 MG TABS tablet TAKE 1 TABLET BY MOUTH ONCE DAILY WITH SUPPER 07/31/21   Coral Spikes, DO    Family History Family History  Problem Relation Age of Onset   Hypertension Mother    Diabetes Mother    Hypertension Father    Diabetes Father    Heart attack Father    Stroke Father    Cancer Brother    Colon cancer Neg Hx    Colon polyps Neg Hx     Social History Social History   Tobacco Use   Smoking status: Never   Smokeless tobacco: Never  Vaping Use   Vaping Use: Never used  Substance Use Topics   Alcohol use: No   Drug use: No     Allergies   Pravachol   Review of Systems Review of  Systems Per HPI  Physical Exam Triage Vital Signs ED Triage Vitals  Enc Vitals Group     BP 10/14/21 1922 (!) 186/84     Pulse Rate 10/14/21 1922 93     Resp 10/14/21 1922 17     Temp 10/14/21 1922 (!) 97.5 F (36.4 C)     Temp Source 10/14/21 1922 Oral     SpO2 10/14/21 1922 98 %     Weight --      Height --      Head Circumference --      Peak Flow --      Pain Score 10/14/21 1930 0     Pain Loc --      Pain Edu? --      Excl. in [place]? --    No data found.  Updated Vital Signs BP (!) 186/84 (BP Location: Right Arm)   Pulse 93   Temp (!) 97.5 F (36.4 C) (Oral)   Resp 17   SpO2 98%   Visual Acuity Right Eye Distance:   Left Eye Distance:   Bilateral Distance:    Right Eye Near:   Left Eye Near:    Bilateral Near:     Physical Exam Vitals and nursing note reviewed.  Constitutional:      Appearance: Normal appearance. She is not ill-appearing.  HENT:     Head: Atraumatic.     Mouth/Throat:     Mouth: Mucous membranes are moist.  Eyes:     Extraocular Movements: Extraocular movements intact.     Conjunctiva/sclera: Conjunctivae normal.  Cardiovascular:     Rate and Rhythm: Normal rate and regular rhythm.     Heart sounds: Normal heart sounds.  Pulmonary:     Effort: Pulmonary effort is normal.     Breath sounds: Normal breath sounds.  Abdominal:     General: Bowel sounds are normal. There is no distension.     Palpations: Abdomen is soft.     Tenderness: There is no abdominal tenderness. There is no right CVA tenderness, left CVA tenderness or guarding.  Musculoskeletal:        General: Swelling present. Normal range of motion.     Cervical back: Normal range of motion and neck supple.     Comments: Symmetric bilateral nonpitting lower extremity edema, nontender, not erythematous  Skin:    General: Skin is warm and dry.  Neurological:     General: No focal deficit present.     Mental Status: She is alert.  Cranial Nerves: No cranial nerve  deficit.     Motor: No weakness.     Gait: Gait normal.     Comments: Bilateral lower extremities neurovascularly intact Appears grossly neurologically intact on exam today.  Psychiatric:        Mood and Affect: Mood normal.        Thought Content: Thought content normal.        Judgment: Judgment normal.      UC Treatments / Results  Labs (all labs ordered are listed, but only abnormal results are displayed) Labs Reviewed  POCT URINALYSIS DIP (MANUAL ENTRY) - Abnormal; Notable for the following components:      Result Value   Blood, UA large (*)    All other components within normal limits  CBC WITH DIFFERENTIAL/PLATELET  COMPREHENSIVE METABOLIC PANEL  POCT FASTING CBG [place]    EKG   Radiology No results found.  Procedures Procedures (including critical care time)  Medications Ordered in UC Medications - No data to display  Initial Impression / Assessment and Plan / UC Course  I have reviewed the triage vital signs and the nursing notes.  Pertinent labs & imaging results that were available during my care of the patient were reviewed by me and considered in my medical decision making (see chart for details).     Very well-appearing 76 year old woman with no significant abnormalities noted on exam.  She is hypertensive today, above her typical baseline but not having any chest pain, shortness of breath, dizziness or other symptoms to be associated with this.  Discussed with patient and daughter to be checking these readings at home and following up with primary care provider on this.  Regarding her altered mental status, urinalysis without evidence of urinary tract infection, CBG within normal limits in clinic, labs pending for further rule out.  She is currently asymptomatic and well-appearing.  Discussed ED for significantly worsening symptoms at any time and close PCP follow-up next week.  Regarding her lower extremity edema, compression stockings, leg  elevation and good moisturization as the skin is very dry and cracking on her legs.  Follow-up with PCP.  Final Clinical Impressions(s) / UC Diagnoses   Final diagnoses:  Altered mental status, unspecified altered mental status type  Combative behavior  Lower leg edema     Discharge Instructions      Your urine did not show evidence of a urinary tract infection today.  We have sent off some basic labs and will let you know if any of that comes back abnormal.  Make sure to stay well-hydrated, get good rest and follow-up with your primary care provider first thing next week.  If your symptoms significantly worsen at any time please go to the emergency department for further evaluation.    ED Prescriptions   None    PDMP not reviewed this encounter.   Volney American, [place] 10/14/21 2023

## 2021-10-14 NOTE — Discharge Instructions (Signed)
Your urine did not show evidence of a urinary tract infection today.  We have sent off some basic labs and will let you know if any of that comes back abnormal.  Make sure to stay well-hydrated, get good rest and follow-up with your primary care provider first thing next week.  If your symptoms significantly worsen at any time please go to the emergency department for further evaluation.

## 2021-10-16 LAB — COMPREHENSIVE METABOLIC PANEL
ALT: 17 IU/L (ref 0–32)
AST: 26 IU/L (ref 0–40)
Albumin/Globulin Ratio: 1.5 (ref 1.2–2.2)
Albumin: 4.2 g/dL (ref 3.8–4.8)
Alkaline Phosphatase: 69 IU/L (ref 44–121)
BUN/Creatinine Ratio: 12 (ref 12–28)
BUN: 13 mg/dL (ref 8–27)
Bilirubin Total: <0.2 mg/dL (ref 0.0–1.2)
CO2: 18 mmol/L — ABNORMAL LOW (ref 20–29)
Calcium: 9.8 mg/dL (ref 8.7–10.3)
Chloride: 105 mmol/L (ref 96–106)
Creatinine, Ser: 1.13 mg/dL — ABNORMAL HIGH (ref 0.57–1.00)
Globulin, Total: 2.8 g/dL (ref 1.5–4.5)
Glucose: 95 mg/dL (ref 70–99)
Potassium: 4.1 mmol/L (ref 3.5–5.2)
Sodium: 141 mmol/L (ref 134–144)
Total Protein: 7 g/dL (ref 6.0–8.5)
eGFR: 51 mL/min/{1.73_m2} — ABNORMAL LOW (ref >59)

## 2021-10-16 LAB — CBC WITH DIFFERENTIAL/PLATELET
Basophils Absolute: 0 10*3/uL (ref 0.0–0.2)
Basos: 0 %
EOS (ABSOLUTE): 0.2 10*3/uL (ref 0.0–0.4)
Eos: 3 %
Hematocrit: 43.4 % (ref 34.0–46.6)
Hemoglobin: 13.8 g/dL (ref 11.1–15.9)
Immature Grans (Abs): 0 10*3/uL (ref 0.0–0.1)
Immature Granulocytes: 0 %
Lymphocytes Absolute: 2.1 10*3/uL (ref 0.7–3.1)
Lymphs: 35 %
MCH: 27.7 pg (ref 26.6–33.0)
MCHC: 31.8 g/dL (ref 31.5–35.7)
MCV: 87 fL (ref 79–97)
Monocytes Absolute: 0.3 10*3/uL (ref 0.1–0.9)
Monocytes: 5 %
Neutrophils Absolute: 3.4 10*3/uL (ref 1.4–7.0)
Neutrophils: 57 %
Platelets: 330 10*3/uL (ref 150–450)
RBC: 4.99 x10E6/uL (ref 3.77–5.28)
RDW: 13.9 % (ref 11.7–15.4)
WBC: 6 10*3/uL (ref 3.4–10.8)

## 2021-10-20 ENCOUNTER — Encounter: Payer: Self-pay | Admitting: Physician Assistant

## 2021-10-20 ENCOUNTER — Ambulatory Visit: Payer: Medicare PPO | Admitting: Physician Assistant

## 2021-10-20 VITALS — BP 157/83 | HR 89 | Ht 70.0 in | Wt 217.6 lb

## 2021-10-20 DIAGNOSIS — F02818 Dementia in other diseases classified elsewhere, unspecified severity, with other behavioral disturbance: Secondary | ICD-10-CM | POA: Diagnosis not present

## 2021-10-20 DIAGNOSIS — G301 Alzheimer's disease with late onset: Secondary | ICD-10-CM | POA: Diagnosis not present

## 2021-10-20 NOTE — Progress Notes (Signed)
Assessment/Plan:   Dementia due to Alzheimer's disease with behavioral disturbance Erin Wall is a very pleasant 76 y.o. RH female with a history of hypertension, hyperlipidemia and a history of dementia likely due to Alzheimer's disease as per neuropsychological evaluation in June 2021 seen today in follow up for memory loss. Patient is currently on rivastigmine 1.5 mg twice daily.  She is also on mirtazapine 15 mg nightly.  Her daughter was not aware that the patient could increase this medicine for better sleep.  She requires close supervision.  Unable to perform MMSE today, her short-term memory is significantly reduced from prior evaluation.    Follow up in  6 months. Increase mirtazapine 30 mg night  Resume rivastigmine 1.5 mg bid with goal of increasing the dose if tolerated. Continue 24/7 supervision    Subjective:    This patient is accompanied in the office by her daughter who supplements the history.  Previous records as well as any outside records available were reviewed prior to todays visit.    Any changes in memory since last visit?  Her memory is "not better, she has no short-term memory at all, she is unable to complete task and she gets very frustrated ".  She has not been taking rivastigmine, so her daughter is not supervising closely.   Patient lives with: alone.  She has 2 sitters to monitor her. Repeats oneself?  Endorsed Disoriented when walking into a room?  "Sometimes she does not know where she is, but this is not constant " Leaving objects in unusual places?  Endorsed.  The other day she placed detergent in the fridge.  "She also loses the remote all the time " Ambulates  with difficulty?   Patient denies   Recent falls?  Patient denies   Any head injuries?  Patient denies   History of seizures?   Patient denies   Wandering behavior?  "Occasionally she wonders across the street ".   Patient drives?   Patient no longer drives  Any mood changes since  last visit?  Patient denies   Any worsening depression?:  Patient denies   Hallucinations? Endorsed by her daughter.  She has "visitors that come to see her, deceased family members and children " Paranoia?  Patient denies   Patient reports that she does not sleep well.  She tried Remeron 15 mg, but has not been increasing as indicated.  She denies any vivid dreams, REM behavior or sleepwalking.  However, she wakes up at night around 3 AM, and begins to reorganize calls folding and unfolding, taking her purses, or sitting in her chair.  She only moves around her bedroom, she does not wander off.   History of sleep apnea?  Patient denies   Any hygiene concerns? Endorsed, "might take 4 days to get her to shower and I had to remind her " Independent of bathing and dressing?  She does need assistance does the patient needs help with medications?  Daughter helps her with the medications Who is in charge of the finances? Daughter  is in charge    Any changes in appetite? Fairly well  Patient have trouble swallowing? Patient denies   Does the patient cook?  Patient denies   Any kitchen accidents such as leaving the stove on? Patient denies   Any headaches?  Patient denies   Double vision? Patient denies   Any focal numbness or tingling?  Patient denies   Chronic back pain Patient denies   Unilateral weakness?  Patient denies   Any tremors?  Patient denies   Any history of anosmia?  Patient denies   Any incontinence of urine?  Patient denies   Any bowel dysfunction?   Patient denies        History on Initial Assessment 12/04/2018: This is a 76 year old right-handed woman with a history of diet-controlled hypertension, hyperlipidemia, presenting for evaluation of memory loss. Her sister Erin Wall is present during the visit to provide additional information. The patient reports that her memory is not bad, but not as sharp. She lives alone. She denies getting lost driving. She reports taking medications  regularly. She reports missing a bill payment in Feb/March. She forgets names but not very often. She occasionally misplaces things. Her sister provides a different history. Erin Wall reports the patient started having memory changes a year ago. She is more forgetful, worse in the evening hours. She has lost her keys several times. In Feb/March, utilities were cut off, including her electricity, cable, and cell phone, she had bills up to $300-400, family helped her fix this and put everything on draft. She was getting lost driving Erin Wall disclosed this privately). Erin Wall reports she gets very confused when she wakes up from dreams. She states she is having dreams. She has sundowning, getting confused and having difficulty understanding what people are telling her. She knows what she wants to say but gets tangled up getting them out. Over the past 1-2 months, she started having hallucinations, telling her sister that there are children in the house or someone had come or their brother who lives out of town is there. She loses things and says somebody got her shoes and found it in her car. She states there was a bazaar that occurred. She is independent with dressing and bathing, no hygiene concerns. Her father had dementia in his 41s. She denies any history of significant head injuries or alcohol use.   She has urinary frequency. She denies any headaches, dizziness, diplopia, dysarthria, dysphagia, neck/back pain, focal numbness/tingling/weakness, bowel dysfunction. No anosmia, tremors, no falls. Sleep is good. She fell a few months ago on her deck and injured her knee. She states her mood is great. She is a retired Pharmacist, [employer].   I personally reviewed head CT without contrast done 09/2018 which did not show any acute changes. It was noted that the ventricles are rather prominent compared to sulci. While this finding may be due to atrophy, a degree of communicating hydrocephalus cannot be  excluded on this study.    Prior medications: Donepezil (side effects)   Neuropsychological testing in June 2021 showed prominent areas of weakness surrounding learning and memory, cognitive flexibility, and expressive language. Diagnosis of Major Neurocognitive Disorder (ie dementia) likely due to Alzheimer's disease. It was noted that Lewy body dementia could not be ruled out, she does have hallucinations however did not have any other associated symptoms with intact visuoconstructional abilities on testing  PREVIOUS MEDICATIONS:   CURRENT MEDICATIONS:  Outpatient Encounter Medications as of 10/20/2021  Medication Sig   EPINEPHrine 0.3 mg/0.3 mL IJ SOAJ injection Inject 0.3 mLs (0.3 mg total) into the muscle as needed for anaphylaxis.   Melatonin 10 MG CAPS Take 10 mg by mouth at bedtime.   mirtazapine (REMERON) 15 MG tablet Take 1 tablet (15 mg total) by mouth at bedtime.   rivastigmine (EXELON) 1.5 MG capsule Take 1 capsule (1.5 mg total) by mouth 2 (two) times daily.   XARELTO 20 MG TABS tablet  TAKE 1 TABLET BY MOUTH ONCE DAILY WITH SUPPER   No facility-administered encounter medications on file as of 10/20/2021.       04/18/2021    2:00 PM  MMSE - Mini Mental State Exam  Orientation to time 1  Orientation to Place 2  Registration 3  Attention/ Calculation 5  Recall 0  Language- name 2 objects 2  Language- repeat 1  Language- follow 3 step command 3  Language- read & follow direction 1  Write a sentence 1  Copy design 0  Total score 19       No data to display          Objective:     PHYSICAL EXAMINATION:    VITALS:   Vitals:   10/20/21 1051  BP: (!) 157/83  Pulse: 89  SpO2: 98%  Weight: 217 lb 9.6 oz (98.7 kg)  Height: '5\' 10"'$  (1.778 m)    GEN:  The patient appears stated age and is in NAD. HEENT:  Normocephalic, atraumatic.   Neurological examination:  General: NAD, well-groomed, appears stated age. Orientation: The patient is alert. Oriented to person  not to place or date. Cranial nerves: There is good facial symmetry.The speech is fluent and clear. No aphasia or dysarthria. Fund of knowledge is reduced.  Recent and remote memory are impaired. Attention and concentration are reduced.  Unable to name objects and repeat phrases.  Hearing is intact to conversational tone.    Sensation: Sensation is intact to light touch throughout Motor: Strength is at least antigravity x4. Tremors: none  DTR's 2/4 in UE/LE     Movement examination: Tone: There is normal tone in the UE/LE Abnormal movements:  no tremor.  No myoclonus.  No asterixis.   Coordination:  There is no decremation with RAM's. Normal finger to nose  Gait and Station: The patient has no difficulty arising out of a deep-seated chair without the use of the hands. The patient's stride length is good.  Gait is cautious and narrow.    Thank you for allowing [place] the opportunity to participate in the care of this nice patient. Please do not hesitate to contact us for any questions or concerns.   Total time spent on today's visit was 30 minutes dedicated to this patient today, preparing to see patient, examining the patient, ordering tests and/or medications and counseling the patient, documenting clinical information in the EHR or other health record, independently interpreting results and communicating results to the patient/family, discussing treatment and goals, answering patient's questions and coordinating care.  Cc:  Ouida Sills [facility] 10/21/2021 7:09 AM

## 2021-10-20 NOTE — Patient Instructions (Signed)
Good to see you.   Take mirtazapine '15mg'$ : increase it 2 tablet every night to help with sleep.   Rivastigmine 1.5 mg tablet: take 1 tablet twice a day  Recommend walker to prevent falls  Continue close supervision  3. Follow-up in 6 months, call for any changes   FALL PRECAUTIONS: Be cautious when walking. Scan the area for obstacles that may increase the risk of trips and falls. When getting up in the mornings, sit up at the edge of the bed for a few minutes before getting out of bed. Consider elevating the bed at the head end to avoid drop of blood pressure when getting up. Walk always in a well-lit room (use night lights in the walls). Avoid area rugs or power cords from appliances in the middle of the walkways. Use a walker or a cane if necessary and consider physical therapy for balance exercise. Get your eyesight checked regularly.  HOME SAFETY: Consider the safety of the kitchen when operating appliances like stoves, microwave oven, and blender. Consider having supervision and share cooking responsibilities until no longer able to participate in those. Accidents with firearms and other hazards in the house should be identified and addressed as well.  ABILITY TO BE LEFT ALONE: If patient is unable to contact 911 operator, consider using LifeLine, or when the need is there, arrange for someone to stay with patients. Smoking is a fire hazard, consider supervision or cessation. Risk of wandering should be assessed by caregiver and if detected at any point, supervision and safe proof recommendations should be instituted.   RECOMMENDATIONS FOR ALL PATIENTS WITH MEMORY PROBLEMS: 1. Continue to exercise (Recommend 30 minutes of walking everyday, or 3 hours every week) 2. Increase social interactions - continue going to [place] and enjoy social gatherings with friends and family 3. Eat healthy, avoid fried foods and eat more fruits and vegetables 4. Maintain adequate blood pressure, blood sugar,  and blood cholesterol level. Reducing the risk of stroke and cardiovascular disease also helps promoting better memory. 5. Avoid stressful situations. Live a simple life and avoid aggravations. Organize your time and prepare for the next day in anticipation. 6. Sleep well, avoid any interruptions of sleep and avoid any distractions in the bedroom that may interfere with adequate sleep quality 7. Avoid sugar, avoid sweets as there is a strong link between excessive sugar intake, diabetes, and cognitive impairment The Mediterranean diet has been shown to help patients reduce the risk of progressive memory disorders and reduces cardiovascular risk. This includes eating fish, eat fruits and green leafy vegetables, nuts like almonds and hazelnuts, walnuts, and also use olive oil. Avoid fast foods and fried foods as much as possible. Avoid sweets and sugar as sugar use has been linked to worsening of memory function.  There is always a concern of gradual progression of memory problems. If this is the case, then we may need to adjust level of care according to patient needs. Support, both to the patient and caregiver, should then be put into place.

## 2021-10-21 ENCOUNTER — Ambulatory Visit: Payer: Medicare PPO | Admitting: Family Medicine

## 2021-10-26 ENCOUNTER — Telehealth: Payer: Self-pay | Admitting: Physician Assistant

## 2021-10-26 ENCOUNTER — Other Ambulatory Visit: Payer: Self-pay | Admitting: Physician Assistant

## 2021-10-26 MED ORDER — MIRTAZAPINE 30 MG PO TABS: 30.0000 mg | ORAL_TABLET | Freq: Every day | ORAL | 11 refills | Status: DC

## 2021-10-26 NOTE — Telephone Encounter (Signed)
Patients sister called, she would like to know if sara can send In the new RX for mirtazapine '30mg'$  to [facility] in [place]. She was on '15mg'$ , but sara wanted her to take '30mg'$  [facility].

## 2021-11-09 ENCOUNTER — Telehealth: Payer: Self-pay | Admitting: Physician Assistant

## 2021-11-09 NOTE — Telephone Encounter (Signed)
Pt sister said pt is not sleeping and is agitated. They upped her to '30mg'$  mirtazapine from '15mg'$  and still isnt helping

## 2021-11-10 NOTE — Telephone Encounter (Signed)
Left message to call back at 3:22pm 11/10/2021

## 2021-11-10 NOTE — Telephone Encounter (Signed)
Advised to increase to '45mg'$  and call with report next week, thanked me for calling.

## 2021-11-20 ENCOUNTER — Other Ambulatory Visit: Payer: Self-pay | Admitting: Family Medicine

## 2021-12-12 ENCOUNTER — Telehealth: Payer: Self-pay | Admitting: Anesthesiology

## 2021-12-12 ENCOUNTER — Other Ambulatory Visit: Payer: Self-pay

## 2021-12-12 MED ORDER — MIRTAZAPINE 30 MG PO TABS: 30.0000 mg | ORAL_TABLET | Freq: Every day | ORAL | 1 refills | Status: DC

## 2021-12-12 NOTE — Telephone Encounter (Signed)
Patient called requesting a refill be sent to her pharmacy for mirtazapine.

## 2021-12-27 ENCOUNTER — Other Ambulatory Visit (HOSPITAL_COMMUNITY): Payer: Self-pay | Admitting: Adult Health

## 2021-12-27 DIAGNOSIS — Z1231 Encounter for screening mammogram for malignant neoplasm of breast: Secondary | ICD-10-CM

## 2022-01-03 ENCOUNTER — Ambulatory Visit: Payer: Medicare PPO | Admitting: Family Medicine

## 2022-01-03 VITALS — BP 154/96 | HR 90 | Temp 97.6°F | Ht 70.0 in | Wt 221.4 lb

## 2022-01-03 DIAGNOSIS — F0280 Dementia in other diseases classified elsewhere without behavioral disturbance: Secondary | ICD-10-CM | POA: Diagnosis not present

## 2022-01-03 DIAGNOSIS — G309 Alzheimer's disease, unspecified: Secondary | ICD-10-CM | POA: Diagnosis not present

## 2022-01-03 NOTE — Patient Instructions (Signed)
Stop the Ex[employer].  Follow up in 3 months.  Take care  Dr. Lacinda Axon

## 2022-01-06 NOTE — Progress Notes (Signed)
Subjective:  Patient ID: Erin Wall, female    DOB: 1945-06-28  Age: 76 y.o. MRN: [id number]  CC: Chief Complaint  Patient presents with   Follow-up    Fall    HPI:  76 year old female with HTN, Osteoarthritis, Hx of DVT (currently on chronic anticoagulation), Alzheimer disease presents for follow up.  Has had 2 recent falls. No significant injury suffered as a result of the falls. Sister (caregiver) is concerned. Remeron has recently been increased. She is currently on Exelon. Has had no significant improvement on the medication.   Has difficulty getting up from a seated position and is mostly sedentary.   Patient Active Problem List   Diagnosis Date Noted   Lower extremity edema 06/07/2021   Alzheimer disease ([place]) 05/27/2020   History of DVT of lower extremity 05/27/2020   S/P hysterectomy 05/27/2020   Unilateral primary osteoarthritis, right knee 01/17/2019   Hyperlipidemia 05/09/2012   Hypertension 05/09/2012    Social Hx   Social History   Socioeconomic History   Marital status: Single    Spouse name: Not on file   Number of children: Not on file   Years of education: 18   Highest education level: Master's degree (e.g., MA, MS, MEng, MEd, MSW, MBA)  Occupational History   Occupation: retired Pharmacist, hospital   Tobacco Use   Smoking status: Never   Smokeless tobacco: Never  Vaping Use   Vaping Use: Never used  Substance and Sexual Activity   Alcohol use: No   Drug use: No   Sexual activity: Not Currently    Birth control/protection: Surgical, Post-menopausal    Comment: hyst  Other Topics Concern   Not on file  Social History Narrative   Lives one level lives alone   Right handed   Caffeine - not much    Exercise - some   Education - masters   Social Determinants of Health   Financial Resource Strain: Low Risk  (05/27/2020)   Overall Financial Resource Strain (CARDIA)    Difficulty of Paying Living Expenses: Not hard at all  Food Insecurity: No Food  Insecurity (05/27/2020)   Hunger Vital Sign    Worried About Running Out of Food in the Last Year: Never true    Ran Out of Food in the Last Year: Never true  Transportation Needs: No Transportation Needs (05/27/2020)   PRAPARE - Hydrologist (Medical): No    Lack of Transportation (Non-Medical): No  Physical Activity: Inactive (05/27/2020)   Exercise Vital Sign    Days of Exercise per Week: 0 days    Minutes of Exercise per Session: 0 min  Stress: Stress Concern Present (05/27/2020)   [place]    Feeling of Stress : To some extent  Social Connections: Socially Isolated (05/27/2020)   Social Connection and Isolation Panel [NHANES]    Frequency of Communication with Friends and Family: More than three times a week    Frequency of Social Gatherings with Friends and Family: More than three times a week    Attends Religious Services: Never    Marine scientist or Organizations: No    Attends Archivist Meetings: Never    Marital Status: Never married    Review of Systems Per HPI  Objective:  BP (!) 154/96   Pulse 90   Temp 97.6 F (36.4 C) (Oral)   Ht '5\' 10"'$  (1.778 m)   Wt  221 lb 6.4 oz (100.4 kg)   SpO2 96%   BMI 31.77 kg/m      01/03/2022    3:36 PM 01/03/2022    3:00 PM 10/20/2021   10:51 AM  BP/Weight  Systolic BP 121 975 883  Diastolic BP 96 70 83  Wt. (Lbs)  221.4 217.6  BMI  31.77 kg/m2 31.22 kg/m2    Physical Exam Constitutional:      General: She is not in acute distress.    Appearance: Normal appearance.  HENT:     Head: Normocephalic and atraumatic.  Eyes:     General:        Right eye: No discharge.        Left eye: No discharge.     Conjunctiva/sclera: Conjunctivae normal.  Cardiovascular:     Rate and Rhythm: Normal rate and regular rhythm.  Pulmonary:     Effort: Pulmonary effort is normal.     Breath sounds: Normal breath sounds. No  wheezing, rhonchi or rales.  Neurological:     Mental Status: She is alert.     Lab Results  Component Value Date   WBC 6.0 10/14/2021   HGB 13.8 10/14/2021   HCT 43.4 10/14/2021   PLT 330 10/14/2021   GLUCOSE 95 10/14/2021   CHOL 204 (H) 04/19/2021   TRIG 52 04/19/2021   HDL 68 04/19/2021   LDLCALC 127 (H) 04/19/2021   ALT 17 10/14/2021   AST 26 10/14/2021   NA 141 10/14/2021   K 4.1 10/14/2021   CL 105 10/14/2021   CREATININE 1.13 (H) 10/14/2021   BUN 13 10/14/2021   CO2 18 (L) 10/14/2021   TSH 0.661 09/23/2018   HGBA1C 5.7 (H) 11/10/2016     Assessment & Plan:   Problem List Items Addressed This Visit       Nervous and Auditory   Alzheimer disease ([place]) - Primary    Stopping Exelon - could be contributing to falls. Continue current dosing of Remeron. Close monitoring.        Follow-up:  Return in about 3 months (around 04/05/2022).  [place]

## 2022-01-06 NOTE — Assessment & Plan Note (Signed)
Stopping Exelon - could be contributing to falls. Continue current dosing of Remeron. Close monitoring.

## 2022-01-09 ENCOUNTER — Ambulatory Visit (HOSPITAL_COMMUNITY)
Admission: RE | Admit: 2022-01-09 | Discharge: 2022-01-09 | Disposition: A | Discharge status: Home / Self Care | Payer: Medicare PPO | Source: Ambulatory Visit | Attending: Adult Health | Admitting: Adult Health

## 2022-01-09 DIAGNOSIS — Z1231 Encounter for screening mammogram for malignant neoplasm of breast: Secondary | ICD-10-CM | POA: Diagnosis not present

## 2022-01-10 ENCOUNTER — Ambulatory Visit (INDEPENDENT_AMBULATORY_CARE_PROVIDER_SITE_OTHER): Payer: Medicare PPO | Admitting: Nurse Practitioner

## 2022-01-10 ENCOUNTER — Encounter: Payer: Medicare PPO | Admitting: Nurse Practitioner

## 2022-01-10 ENCOUNTER — Encounter: Payer: Self-pay | Admitting: Nurse Practitioner

## 2022-01-10 DIAGNOSIS — Z0000 Encounter for general adult medical examination without abnormal findings: Secondary | ICD-10-CM

## 2022-01-10 NOTE — Patient Instructions (Addendum)
Thank you for coming for your annual wellness visit.  Please follow through on any advice that was given to you by today's visit. Remember to maintain compliance with your medications as discussed today.  Also remember it is important to eat a healthy diet and to stay physically active on a daily basis.  Please follow through with any testing or recommended followup office visits as was discussed today. You are due the following test coming up:  Colonoscopy no longer indicated Flu vaccine. Declined today.  COVID vaccine. Per patient guardian patient has received COVID vaccines. Will bring in copy or upload to MyChart Shingles vaccine. Patient to schedule Hepatitis C. Patient to get at next office visit to be drawn with other labs Pneumonia Vaccine. Patient to get at next office visit.      Finally remembered that the annual wellness visit does not take the place of regularly scheduled office visits  chronic health problems such as hypertension/diabetes/cholesterol visits.

## 2022-01-10 NOTE — Progress Notes (Signed)
   Subjective:    Patient ID: Erin Wall, female    DOB: Jun 14, 1945, 76 y.o.   MRN: [id number]  HPI Virtual Visit via Telephone Note   I connected with Erin Wall on 01/10/22 at  There are other unrelated non-urgent complaints, but due to the busy schedule and the amount of time I've already spent with her, time does not permit me to address these routine issues at today's visit. I've requested another appointment to review these additional issues. by telephone and verified that I am speaking with the correct person using two identifiers.   Location: office  Patient: home  Provider: office   I discussed the limitations, risks, security and privacy concerns of performing an evaluation and management service by telephone and the availability of in person appointments. I also discussed with the patient that there may be a patient responsible charge related to this service. The patient expressed understanding and agreed to proceed.  I provided 15 minutes of non-face-to-face time during this encounter.   ---------------------------------------------------------------------------------------------------  AWV- Annual Wellness Visit  The patient was seen for their annual wellness visit. The patient's past medical history, surgical history, and family history were reviewed. Pertinent vaccines were reviewed ( tetanus, pneumonia, shingles, flu) The patient's medication list was reviewed and updated.  The height and weight were entered.  BMI recorded in electronic record elsewhere  Cognitive screening was completed. Outcome of Mini - Cog: telephone visit    Falls /depression screening electronically recorded within record elsewhere  Current tobacco usage:none (All patients who use tobacco were given written and verbal information on quitting)  Recent listing of emergency department/hospitalizations over the past year were reviewed.  current specialist the patient sees on a  regular basis: neuro   Medicare annual wellness visit patient questionnaire was reviewed.  A written screening schedule for the patient for the next 5-10 years was given. Appropriate discussion of followup regarding next visit was discussed.     Review of Systems  All other systems reviewed and are negative.      Objective:   Physical Exam Vitals reviewed.   Spoke with guardian of patient. No signs/symptoms of distress noted via telephone encounter.        Assessment & Plan:   1. Medicare annual wellness visit, subsequent -Adult wellness-complete.wellness physical was conducted today. Importance of diet and exercise were discussed in detail.  Importance of stress reduction and healthy living were discussed.  In addition to this a discussion regarding safety was also covered.  We also reviewed over immunizations and gave recommendations regarding current immunization needed for age.   In addition to this additional areas were also touched on including: Preventative health exams needed:  Colonoscopy no longer indicated Flu vaccine. Declined today.  COVID vaccine. Per patient guardian patient has received COVID vaccines. Will bring in copy or upload to MyChart Shingles vaccine. Patient to schedule Hepatitis C. Patient to get at next office visit to be drawn with other labs Pneumonia Vaccine. Patient to get at next office visit.  Patient was advised yearly wellness exam

## 2022-01-11 ENCOUNTER — Other Ambulatory Visit (HOSPITAL_COMMUNITY): Payer: Self-pay | Admitting: Adult Health

## 2022-01-11 DIAGNOSIS — R928 Other abnormal and inconclusive findings on diagnostic imaging of breast: Secondary | ICD-10-CM

## 2022-01-13 ENCOUNTER — Telehealth: Payer: Self-pay

## 2022-01-13 NOTE — Telephone Encounter (Signed)
Pt sister contacted and states that she would like something to relax her and help her calm down. Shelia states pt hasn't had any falls since being off the medication. Adela Lank states she is looking at her on camera and she is really busy. Please advise. Thank you.

## 2022-01-13 NOTE — Telephone Encounter (Signed)
Caller name: LUDWIKA RODD [facility])   On DPR?: Yes  Call back number: [phone] (mobile) [phone]  Provider they see: Coral Spikes, DO  Reason for call:Pt was taken off ivastigmine (EXELON) 1.5 MG capsule and now she is busy and can't sleep and agitated real easy pt daughter is wanting advise what to do she is taking mirtazapine '30mg'$  that she takes at bed time still

## 2022-01-13 NOTE — Telephone Encounter (Signed)
Please advise. Thank you

## 2022-01-16 ENCOUNTER — Other Ambulatory Visit: Payer: Self-pay | Admitting: Family Medicine

## 2022-01-16 MED ORDER — LORAZEPAM 0.5 MG PO TABS: 0.5000 mg | ORAL_TABLET | Freq: Two times a day (BID) | ORAL | 1 refills | Status: DC | PRN

## 2022-01-16 NOTE — Telephone Encounter (Signed)
Sister(DPR) notified

## 2022-01-16 NOTE — Telephone Encounter (Signed)
Left message to return call 

## 2022-01-24 ENCOUNTER — Ambulatory Visit (HOSPITAL_COMMUNITY)
Admission: RE | Admit: 2022-01-24 | Discharge: 2022-01-24 | Disposition: A | Discharge status: Home / Self Care | Payer: Medicare PPO | Source: Ambulatory Visit | Attending: Adult Health | Admitting: Adult Health

## 2022-01-24 ENCOUNTER — Other Ambulatory Visit (HOSPITAL_COMMUNITY): Payer: Self-pay | Admitting: Adult Health

## 2022-01-24 DIAGNOSIS — R928 Other abnormal and inconclusive findings on diagnostic imaging of breast: Secondary | ICD-10-CM | POA: Insufficient documentation

## 2022-01-24 DIAGNOSIS — R922 Inconclusive mammogram: Secondary | ICD-10-CM | POA: Diagnosis not present

## 2022-01-26 ENCOUNTER — Ambulatory Visit (HOSPITAL_COMMUNITY): Payer: Medicare PPO

## 2022-01-26 ENCOUNTER — Ambulatory Visit (HOSPITAL_COMMUNITY)
Admission: RE | Admit: 2022-01-26 | Discharge: 2022-01-26 | Disposition: A | Discharge status: Home / Self Care | Payer: Medicare PPO | Source: Ambulatory Visit | Attending: Adult Health | Admitting: Adult Health

## 2022-01-26 ENCOUNTER — Encounter (HOSPITAL_COMMUNITY): Payer: Medicare PPO

## 2022-01-26 ENCOUNTER — Other Ambulatory Visit (HOSPITAL_COMMUNITY): Payer: Self-pay | Admitting: Family Medicine

## 2022-01-26 ENCOUNTER — Ambulatory Visit (HOSPITAL_COMMUNITY)
Admission: RE | Admit: 2022-01-26 | Discharge: 2022-01-26 | Disposition: A | Discharge status: Home / Self Care | Payer: Medicare PPO | Source: Ambulatory Visit | Attending: Family Medicine | Admitting: Family Medicine

## 2022-01-26 DIAGNOSIS — N6011 Diffuse cystic mastopathy of right breast: Secondary | ICD-10-CM | POA: Diagnosis not present

## 2022-01-26 DIAGNOSIS — R928 Other abnormal and inconclusive findings on diagnostic imaging of breast: Secondary | ICD-10-CM

## 2022-01-26 DIAGNOSIS — N6081 Other benign mammary dysplasias of right breast: Secondary | ICD-10-CM | POA: Diagnosis not present

## 2022-01-26 DIAGNOSIS — N6312 Unspecified lump in the right breast, upper inner quadrant: Secondary | ICD-10-CM | POA: Diagnosis not present

## 2022-01-26 HISTORY — PX: BREAST BIOPSY: SHX20

## 2022-01-26 MED ORDER — LIDOCAINE-EPINEPHRINE (PF) 1 %-1:200000 IJ SOLN
INTRAMUSCULAR | Status: AC
  Filled 2022-01-26: qty 30

## 2022-01-26 MED ORDER — LIDOCAINE HCL (PF) 2 % IJ SOLN
INTRAMUSCULAR | Status: AC
  Filled 2022-01-26: qty 10

## 2022-01-27 LAB — SURGICAL PATHOLOGY

## 2022-02-07 ENCOUNTER — Telehealth: Payer: Self-pay | Admitting: Family Medicine

## 2022-02-07 ENCOUNTER — Other Ambulatory Visit: Payer: Self-pay

## 2022-02-07 MED ORDER — RIVAROXABAN 20 MG PO TABS: 20.0000 mg | ORAL_TABLET | Freq: Every day | ORAL | 0 refills | Status: DC

## 2022-02-07 NOTE — Telephone Encounter (Signed)
Prescription Request  02/07/2022  Is this a "Controlled Substance" medicine? No  LOV: 01/03/2022  What is the name of the medication or equipment? XARELTO 20 MG TABS tablet   Have you contacted your pharmacy to request a refill? No   Which pharmacy would you like this sent to?  [facility] [facility] [place] - [id number] [place] [address] Phone: [phone] Fax: [phone]    Patient notified that their request is being sent to the clinical staff for review and that they should receive a response within 2 business days.   Please advise at [facility] [phone]

## 2022-03-20 ENCOUNTER — Ambulatory Visit (INDEPENDENT_AMBULATORY_CARE_PROVIDER_SITE_OTHER): Payer: Medicare HMO | Admitting: Family Medicine

## 2022-03-20 DIAGNOSIS — R600 Localized edema: Secondary | ICD-10-CM

## 2022-03-20 DIAGNOSIS — R32 Unspecified urinary incontinence: Secondary | ICD-10-CM | POA: Diagnosis not present

## 2022-03-20 DIAGNOSIS — M545 Low back pain, unspecified: Secondary | ICD-10-CM | POA: Diagnosis not present

## 2022-03-20 DIAGNOSIS — R300 Dysuria: Secondary | ICD-10-CM | POA: Diagnosis not present

## 2022-03-20 DIAGNOSIS — I10 Essential (primary) hypertension: Secondary | ICD-10-CM | POA: Diagnosis not present

## 2022-03-20 LAB — POCT URINALYSIS DIP (CLINITEK)
Bilirubin, UA: NEGATIVE
Glucose, UA: NEGATIVE mg/dL
Ketones, POC UA: NEGATIVE mg/dL
Nitrite, UA: NEGATIVE
POC PROTEIN,UA: NEGATIVE
Spec Grav, UA: 1.025 (ref 1.010–1.025)
Urobilinogen, UA: 0.2 E.U./dL
pH, UA: 6.5 (ref 5.0–8.0)

## 2022-03-20 MED ORDER — CEPHALEXIN 500 MG PO CAPS: 500.0000 mg | ORAL_CAPSULE | Freq: Two times a day (BID) | ORAL | 0 refills | Status: DC

## 2022-03-20 MED ORDER — LORAZEPAM 0.5 MG PO TABS: 0.5000 mg | ORAL_TABLET | Freq: Two times a day (BID) | ORAL | 3 refills | Status: DC | PRN

## 2022-03-20 NOTE — Assessment & Plan Note (Signed)
No pain currently. Will continue to monitor.

## 2022-03-20 NOTE — Assessment & Plan Note (Signed)
BP mildly elevated today. Goal <150/90 given age and dementia. Will continue to monitor.

## 2022-03-20 NOTE — Assessment & Plan Note (Signed)
Discussed low dose HCTZ. Sister and I both would like to avoid this due to currently urinary issues and concern over possible dehydration and electrolyte disturbance.  Rx given for compression stockings. Advised elevation.

## 2022-03-20 NOTE — Assessment & Plan Note (Signed)
UA concerning for UTI. Starting on Keflex while awaiting culture.

## 2022-03-20 NOTE — Progress Notes (Signed)
Subjective:  Patient ID: Erin Wall, female    DOB: 07/25/1945  Age: 77 y.o. MRN: [id number]  CC: Chief Complaint  Patient presents with   pain in low back left side    X 2 weeks   possible blood stoool     Noticed last week, fecal incontinence, more frequent urination in adult diaper    foot and leg swelling     Cellulitis on legs     HPI:  77 year old female with HTN, Alzheimer's disease, history of DVT (is on chronic Xarelto) presents for evaluation of multiple issues.  She is accompanied by her caregiver (sister). She reports that the patient has recently been complaining about left sided lower back pain. No reports of pain today.  Lower extremity edema has been worsening.  Difficulty with compression stockings. Sister has difficulty getting her to elevate legs and also to sleep in the bed instead of a chair.  There has been some redness of the lower legs associated with the edema. Sister has been applying a topical agent to prevent skin breakdown.  Also, sister states that she is now having urinary incontinence. There has been some blood in the depends and with wiping after urination. No reports of blood in the stool.  Patient Active Problem List   Diagnosis Date Noted   Urinary incontinence 03/20/2022   Low back pain 03/20/2022   Lower extremity edema 06/07/2021   Alzheimer disease ([place]) 05/27/2020   History of DVT of lower extremity 05/27/2020   S/P hysterectomy 05/27/2020   Unilateral primary osteoarthritis, right knee 01/17/2019   Hyperlipidemia 05/09/2012   Hypertension 05/09/2012    Social Hx   Social History   Socioeconomic History   Marital status: Single    Spouse name: Not on file   Number of children: Not on file   Years of education: 18   Highest education level: Master's degree (e.g., MA, MS, MEng, MEd, MSW, MBA)  Occupational History   Occupation: retired Pharmacist, hospital   Tobacco Use   Smoking status: Never   Smokeless tobacco: Never  Vaping Use    Vaping Use: Never used  Substance and Sexual Activity   Alcohol use: No   Drug use: No   Sexual activity: Not Currently    Birth control/protection: Surgical, Post-menopausal    Comment: hyst  Other Topics Concern   Not on file  Social History Narrative   Lives one level lives alone   Right handed   Caffeine - not much    Exercise - some   Education - masters   Social Determinants of Health   Financial Resource Strain: Low Risk  (05/27/2020)   Overall Financial Resource Strain (CARDIA)    Difficulty of Paying Living Expenses: Not hard at all  Food Insecurity: No Food Insecurity (05/27/2020)   Hunger Vital Sign    Worried About Running Out of Food in the Last Year: Never true    Ran Out of Food in the Last Year: Never true  Transportation Needs: No Transportation Needs (05/27/2020)   PRAPARE - Hydrologist (Medical): No    Lack of Transportation (Non-Medical): No  Physical Activity: Inactive (05/27/2020)   Exercise Vital Sign    Days of Exercise per Week: 0 days    Minutes of Exercise per Session: 0 min  Stress: Stress Concern Present (05/27/2020)   [place]    Feeling of Stress : To some extent  Social Connections: Socially Isolated (05/27/2020)   Social Connection and Isolation Panel [NHANES]    Frequency of Communication with Friends and Family: More than three times a week    Frequency of Social Gatherings with Friends and Family: More than three times a week    Attends Religious Services: Never    Printmaker: No    Attends Music therapist: Never    Marital Status: Never married    Review of Systems Per HPI  Objective:  BP (!) 151/87   Pulse 91   Temp (!) 97.3 F (36.3 C)   Ht '5\' 10"'$  (1.778 m)   Wt 224 lb (101.6 kg)   SpO2 96%   BMI 32.14 kg/m      03/20/2022    3:22 PM 03/20/2022    3:15 PM 01/03/2022    3:36 PM   BP/Weight  Systolic BP 151 761 607  Diastolic BP 87 92 96  Wt. (Lbs)  224   BMI  32.14 kg/m2     Physical Exam Vitals and nursing note reviewed.  Constitutional:      General: She is not in acute distress.    Appearance: She is obese.  HENT:     Head: Normocephalic and atraumatic.  Cardiovascular:     Rate and Rhythm: Normal rate and regular rhythm.     Comments: Bilateral lower legs with hyperpigmentation and erythema. This is consistent with venous stasis. Pulmonary:     Effort: Pulmonary effort is normal.     Breath sounds: Normal breath sounds.  Neurological:     Mental Status: She is alert.     Lab Results  Component Value Date   WBC 6.0 10/14/2021   HGB 13.8 10/14/2021   HCT 43.4 10/14/2021   PLT 330 10/14/2021   GLUCOSE 95 10/14/2021   CHOL 204 (H) 04/19/2021   TRIG 52 04/19/2021   HDL 68 04/19/2021   LDLCALC 127 (H) 04/19/2021   ALT 17 10/14/2021   AST 26 10/14/2021   NA 141 10/14/2021   K 4.1 10/14/2021   CL 105 10/14/2021   CREATININE 1.13 (H) 10/14/2021   BUN 13 10/14/2021   CO2 18 (L) 10/14/2021   TSH 0.661 09/23/2018   HGBA1C 5.7 (H) 11/10/2016     Assessment & Plan:   Problem List Items Addressed This Visit       Cardiovascular and Mediastinum   Hypertension    BP mildly elevated today. Goal <150/90 given age and dementia. Will continue to monitor.         Other   Low back pain    No pain currently. Will continue to monitor.       Lower extremity edema    Discussed low dose HCTZ. Sister and I both would like to avoid this due to currently urinary issues and concern over possible dehydration and electrolyte disturbance.  Rx given for compression stockings. Advised elevation.       Urinary incontinence    UA concerning for UTI. Starting on Keflex while awaiting culture.      Relevant Orders   POCT URINALYSIS DIP (CLINITEK) (Completed)   Urine Culture    Meds ordered this encounter  Medications   LORazepam (ATIVAN) 0.5 MG  tablet    Sig: Take 1 tablet (0.5 mg total) by mouth 2 (two) times daily as needed for anxiety or sleep.    Dispense:  30 tablet    Refill:  3   cephALEXin (KEFLEX)  500 MG capsule    Sig: Take 1 capsule (500 mg total) by mouth 2 (two) times daily.    Dispense:  14 capsule    Refill:  0    Follow-up:  Return in about 3 months (around 06/18/2022).  [place]

## 2022-03-20 NOTE — Patient Instructions (Signed)
Medication as prescribed.  Elevated legs.  Compression stockings.  Follow up in 3 months or sooner if needed.

## 2022-03-22 LAB — URINE CULTURE

## 2022-04-05 ENCOUNTER — Ambulatory Visit (INDEPENDENT_AMBULATORY_CARE_PROVIDER_SITE_OTHER): Payer: Medicare HMO

## 2022-04-05 ENCOUNTER — Ambulatory Visit
Admission: EM | Admit: 2022-04-05 | Discharge: 2022-04-05 | Disposition: A | Discharge status: Home / Self Care | Payer: Medicare HMO

## 2022-04-05 DIAGNOSIS — M25512 Pain in left shoulder: Secondary | ICD-10-CM | POA: Diagnosis not present

## 2022-04-05 DIAGNOSIS — M19012 Primary osteoarthritis, left shoulder: Secondary | ICD-10-CM | POA: Diagnosis not present

## 2022-04-05 NOTE — Discharge Instructions (Addendum)
The x-ray did not show any fractures or dislocation.  As discussed, symptoms are related to degenerative changes associated with normal wear and tear. Recommend over-the-counter Tylenol Arthritis Strength 650 mg tablets to help with pain or discomfort. Gentle stretching and range of motion exercises to help with pain or discomfort. May continue use of heat as needed.  Apply for 20 minutes, remove for 1 hour, then repeat is much as possible. If symptoms fail to improve with this treatment, please follow-up with your primary care physician for further evaluation.  You can also follow-up with orthopedics, you can follow-up with [facility] at [phone] or with [facility] at [phone]. Follow-up as needed.

## 2022-04-05 NOTE — ED Triage Notes (Signed)
Per family, pt has left shoulder pain x 3-4 days. Aleve gives some relief.

## 2022-04-05 NOTE — ED Provider Notes (Signed)
[facility]    CSN: [id number] Arrival date & time: 04/05/22  1423      History   Chief Complaint Chief Complaint  Patient presents with   Shoulder Pain    HPI Erin Wall is a 77 y.o. female.   The history is provided by the patient and a relative.   Patient presents with a relative for complaints of left shoulder pain.  Symptoms have been present over the past several days.  The caregiver states the patient's pain radiates down to the left forearm. The patient and relative deny injury, fall, trauma, swelling, decreased range of motion, bruising, or redness.  Patient's reports that she has been taking Aleve.  She states that the patient appears to be doing better today than she normally is.  She also states that she has been using heat to the right shoulder.  Past Medical History:  Diagnosis Date   Arthritis 05/09/2012   Borderline diabetes    DVT (deep venous thrombosis) (HCC)    Generalized anxiety disorder    GERD (gastroesophageal reflux disease)    Hematuria 12/07/2014   Hemorrhoids    Hyperlipemia    Hyperlipidemia 05/09/2012   Hypertension    Impaired fasting glucose 11/15/2012   Lung nodules    Lymphocytosis 05/09/2012   Major neurocognitive disorder due to Alzheimer's disease, possible 08/13/2019   Postmenopausal 12/07/2014   Tendonitis    Unilateral primary osteoarthritis, right knee 01/17/2019   Urinary frequency 12/07/2014    Patient Active Problem List   Diagnosis Date Noted   Urinary incontinence 03/20/2022   Low back pain 03/20/2022   Lower extremity edema 06/07/2021   Alzheimer disease ([place]) 05/27/2020   History of DVT of lower extremity 05/27/2020   S/P hysterectomy 05/27/2020   Unilateral primary osteoarthritis, right knee 01/17/2019   Hyperlipidemia 05/09/2012   Hypertension 05/09/2012    Past Surgical History:  Procedure Laterality Date   ABDOMINAL HYSTERECTOMY     BACTERIAL OVERGROWTH TEST  01/01/2012   Procedure: BACTERIAL  OVERGROWTH TEST;  Surgeon: Danie Binder, MD;  Location: [facility];  Service: Endoscopy;  Laterality: N/A;  7:30   BREAST BIOPSY     BREAST BIOPSY Right 01/26/2022   [place] RT BREAST BX W LOC DEV 1ST LESION IMG BX SPEC US GUIDE 01/26/2022 [facility]   BREAST EXCISIONAL BIOPSY Left 1978   benign   BREAST SURGERY     tumor removed   COLONOSCOPY  2004   COLONOSCOPY  09/20/2010   [id number] polyps,internal hemorrhoids/polypoid lesion in the cecum   ESOPHAGOGASTRODUODENOSCOPY  09/20/2010   [id number] gastritis    OB History     Gravida  0   Para  0   Term  0   Preterm  0   AB  0   Living  0      SAB  0   IAB  0   Ectopic  0   Multiple  0   Live Births               Home Medications    Prior to Admission medications   Medication Sig Start Date End Date Taking? Authorizing Provider  naproxen sodium (ALEVE) 220 MG tablet Take 220 mg by mouth.   Yes [provider]  cephALEXin (KEFLEX) 500 MG capsule Take 1 capsule (500 mg total) by mouth 2 (two) times daily. 03/20/22   Coral Spikes, DO  EPINEPHrine 0.3 mg/0.3 mL IJ SOAJ injection Inject 0.3 mLs (0.3 mg  total) into the muscle as needed for anaphylaxis. 09/15/19   Lucrezia Starch, MD  LORazepam (ATIVAN) 0.5 MG tablet Take 1 tablet (0.5 mg total) by mouth 2 (two) times daily as needed for anxiety or sleep. 03/20/22   Coral Spikes, DO  rivaroxaban (XARELTO) 20 MG TABS tablet Take 1 tablet (20 mg total) by mouth daily with supper. 02/07/22   Coral Spikes, DO    Family History Family History  Problem Relation Age of Onset   Hypertension Mother    Diabetes Mother    Hypertension Father    Diabetes Father    Heart attack Father    Stroke Father    Cancer Brother    Colon cancer Neg Hx    Colon polyps Neg Hx     Social History Social History   Tobacco Use   Smoking status: Never   Smokeless tobacco: Never  Vaping Use   Vaping Use: Never used  Substance Use Topics   Alcohol use: No    Drug use: No     Allergies   Pravachol   Review of Systems Review of Systems Per HPI  Physical Exam Triage Vital Signs ED Triage Vitals [04/05/22 1452]  Enc Vitals Group     BP (!) 158/85     Pulse Rate 99     Resp 18     Temp 98 F (36.7 C)     Temp Source Oral     SpO2 98 %     Weight      Height      Head Circumference      Peak Flow      Pain Score      Pain Loc      Pain Edu?      Excl. in [place]?    No data found.  Updated Vital Signs BP (!) 158/85 (BP Location: Right Arm)   Pulse 99   Temp 98 F (36.7 C) (Oral)   Resp 18   SpO2 98%   Visual Acuity Right Eye Distance:   Left Eye Distance:   Bilateral Distance:    Right Eye Near:   Left Eye Near:    Bilateral Near:     Physical Exam Vitals and nursing note reviewed.  Constitutional:      General: She is not in acute distress.    Appearance: Normal appearance.  Eyes:     Pupils: Pupils are equal, round, and reactive to light.  Pulmonary:     Effort: Pulmonary effort is normal.  Musculoskeletal:     Left shoulder: Tenderness (Tenderness noted to the posterior right shoulder along the rhomboid muscle.) present. No swelling or deformity. Decreased range of motion.     Left upper arm: Normal.     Left elbow: Normal.     Left forearm: Normal.     Cervical back: Normal range of motion.     Comments: Decreased range of motion with inversion and eversion.  Skin:    General: Skin is warm and dry.  Neurological:     General: No focal deficit present.     Mental Status: She is alert and oriented to person, place, and time.  Psychiatric:        Mood and Affect: Mood normal.        Behavior: Behavior normal.      UC Treatments / Results  Labs (all labs ordered are listed, but only abnormal results are displayed) Labs Reviewed - No data to display  EKG   Radiology DG Shoulder Left  Result Date: 04/05/2022 CLINICAL DATA:  Left shoulder pain for 3 days.  No injury. EXAM: LEFT SHOULDER - 2+  VIEW COMPARISON:  None Available. FINDINGS: Degenerative changes in the glenohumeral and acromioclavicular joints. Small subacromial spur formation. No evidence of acute fracture or dislocation. No focal bone lesion or bone destruction. Soft tissues are unremarkable. Visualized lungs are clear. IMPRESSION: Degenerative changes in the left shoulder. No acute bony abnormalities. Electronically Signed   By: Lucienne Capers M.D.   On: 04/05/2022 16:06    Procedures Procedures (including critical care time)  Medications Ordered in UC Medications - No data to display  Initial Impression / Assessment and Plan / UC Course  I have reviewed the triage vital signs and the nursing notes.  Pertinent labs & imaging results that were available during my care of the patient were reviewed by me and considered in my medical decision making (see chart for details).  The patient is well-appearing, she is in no acute distress, vital signs are stable.  X-ray is negative for fracture or dislocation.  Difficult to determine the cause of the patient's symptoms as she has full range of motion, there is no obvious deformity, swelling, or ecchymosis present to the left shoulder.  Most likely suspect osteoarthritis.  Supportive care recommendations were provided to the patient's family member to include use of arthritis strength Tylenol for pain or discomfort, and continuation of heat.  Recommend following up with orthopedics if symptoms fail to improve.  Information provided for [facility] and for [facility].  Patient and family member are in agreement with this plan of care and verbalized understanding.  All questions were answered.  Patient stable for discharge.  Final Clinical Impressions(s) / UC Diagnoses   Final diagnoses:  Left shoulder pain, unspecified chronicity     Discharge Instructions      The x-ray did not show any fractures or dislocation.  As discussed, symptoms are related to  degenerative changes associated with normal wear and tear. Recommend over-the-counter Tylenol Arthritis Strength 650 mg tablets to help with pain or discomfort. Gentle stretching and range of motion exercises to help with pain or discomfort. May continue use of heat as needed.  Apply for 20 minutes, remove for 1 hour, then repeat is much as possible. If symptoms fail to improve with this treatment, please follow-up with your primary care physician for further evaluation.  You can also follow-up with orthopedics, you can follow-up with [facility] [place] at [phone] or with [facility] at [phone]. Follow-up as needed.      ED Prescriptions   None    PDMP not reviewed this encounter.   Tish Men, NP 04/05/22 1635

## 2022-04-06 ENCOUNTER — Ambulatory Visit: Payer: Medicare PPO | Admitting: Family Medicine

## 2022-04-12 ENCOUNTER — Other Ambulatory Visit: Payer: Self-pay | Admitting: Family Medicine

## 2022-04-20 ENCOUNTER — Ambulatory Visit: Payer: Medicare HMO | Admitting: Physician Assistant

## 2022-04-20 ENCOUNTER — Encounter: Payer: Self-pay | Admitting: Physician Assistant

## 2022-04-20 VITALS — BP 148/77 | HR 88 | Resp 20 | Ht 70.0 in | Wt 216.0 lb

## 2022-04-20 DIAGNOSIS — F02818 Dementia in other diseases classified elsewhere, unspecified severity, with other behavioral disturbance: Secondary | ICD-10-CM

## 2022-04-20 DIAGNOSIS — G301 Alzheimer's disease with late onset: Secondary | ICD-10-CM | POA: Diagnosis not present

## 2022-04-20 NOTE — Patient Instructions (Signed)
Good to see you.   Continue mirtazapine  to help with sleep.  Consider memantine for memory, let us know if you decide to try Recommend walker  or cane to prevent falls  Continue close supervision Monitor urine for UTI signs   3. Follow-up in 6 months, call for any changes   FALL PRECAUTIONS: Be cautious when walking. Scan the area for obstacles that may increase the risk of trips and falls. When getting up in the mornings, sit up at the edge of the bed for a few minutes before getting out of bed. Consider elevating the bed at the head end to avoid drop of blood pressure when getting up. Walk always in a well-lit room (use night lights in the walls). Avoid area rugs or power cords from appliances in the middle of the walkways. Use a walker or a cane if necessary and consider physical therapy for balance exercise. Get your eyesight checked regularly.  HOME SAFETY: Consider the safety of the kitchen when operating appliances like stoves, microwave oven, and blender. Consider having supervision and share cooking responsibilities until no longer able to participate in those. Accidents with firearms and other hazards in the house should be identified and addressed as well.  ABILITY TO BE LEFT ALONE: If patient is unable to contact 911 operator, consider using LifeLine, or when the need is there, arrange for someone to stay with patients. Smoking is a fire hazard, consider supervision or cessation. Risk of wandering should be assessed by caregiver and if detected at any point, supervision and safe proof recommendations should be instituted.   RECOMMENDATIONS FOR ALL PATIENTS WITH MEMORY PROBLEMS: 1. Continue to exercise (Recommend 30 minutes of walking everyday, or 3 hours every week) 2. Increase social interactions - continue going to [place] and enjoy social gatherings with friends and family 3. Eat healthy, avoid fried foods and eat more fruits and vegetables 4. Maintain adequate blood pressure,  blood sugar, and blood cholesterol level. Reducing the risk of stroke and cardiovascular disease also helps promoting better memory. 5. Avoid stressful situations. Live a simple life and avoid aggravations. Organize your time and prepare for the next day in anticipation. 6. Sleep well, avoid any interruptions of sleep and avoid any distractions in the bedroom that may interfere with adequate sleep quality 7. Avoid sugar, avoid sweets as there is a strong link between excessive sugar intake, diabetes, and cognitive impairment The Mediterranean diet has been shown to help patients reduce the risk of progressive memory disorders and reduces cardiovascular risk. This includes eating fish, eat fruits and green leafy vegetables, nuts like almonds and hazelnuts, walnuts, and also use olive oil. Avoid fast foods and fried foods as much as possible. Avoid sweets and sugar as sugar use has been linked to worsening of memory function.  There is always a concern of gradual progression of memory problems. If this is the case, then we may need to adjust level of care according to patient needs. Support, both to the patient and caregiver, should then be put into place.

## 2022-04-20 NOTE — Progress Notes (Signed)
Assessment/Plan:   Dementia likely due to Alzheimer's Disease with Behavioral Disturbance   Erin Wall is a very pleasant 77 y.o. RH female with a history of HTN, HLD, DVT on Xarelto, OA, and history of Dementia likely due to Alzheimer's Disease with behavioral disturbance as per Neuropsych evaluation 2021, seen today in She is also on mirtazapine as needed for sleep.  She requires close supervision. There is noticeable cognitive decline since prior visit.  Unable to perform MMSE.  She needs more help with ADLs.  She is no longer on rivastigmine, daughter reports that she was having increased somnolence with the medicine.  She continues to attend [employer].    Follow up in  6 months.  Continue 24/7 supervision for safety Continue mirtazapine as needed  for sleep  recommend good control of cardiovascular risk factors.   Continue to control mood as per PCP Daughter to consider starting the patient on Namenda, will inform us of the decision Recommend using cane for mobility to prevent falls   Subjective:    This patient is accompanied in the office by her daughter  who supplements the history.  Previous records as well as any outside records available were reviewed prior to todays visit. Patient was last seen on 10/2021    Any changes in memory since last visit?  "STM is worse, unable to complete tasks, which frustrates her ". Patient has some difficulty remembering recent conversations and people names  repeats oneself?  Endorsed Disoriented when walking into a room?  Sometimes she does not know where she is. Leaving objects in unusual places?   Endorsed.  She collects stuff inside her back, including jewelry, TB control, papers, socks, etc.  Wandering behavior?  She is being monitored, Ativan once a week helps her relax  Any personality changes since last visit?  "Agitation every now and then.  " Any worsening depression?:  denies   Hallucinations or paranoia? As before,  "visitors" come to see her, usually deceased family members and children Seizures?    denies    Any sleep changes? Occasionally she needs Remeron to sleep.   Denies vivid dreams, REM behavior or sleepwalking   Sleep apnea?   denies   Any hygiene concerns? Endorsed, needs to be reminded. Independent of bathing and dressing?  Endorsed  Does the patient needs help with medications? Daughter is in charge   Who is in charge of the finances? Daughter is in charge     Any changes in appetite?  denies     Patient have trouble swallowing?  denies   Does the patient cook?  No Any headaches?   denies   Chronic back pain  denies   Ambulates with difficulty?     denies   Recent falls or head injuries? Endorsed, no loss of consciousness or head injury, believed to be due to increased sleepiness. Unilateral weakness, numbness or tingling?    denies   Any tremors?  denies   Any anosmia?  Patient denies   Any incontinence of urine?  Needs pads, diapers .  Any bowel dysfunction?     denies      Patient lives   alone, but her has sitters day and night and daughter checks with cameras at least 3 times at night .  Does the patient drive?No longer drives      History on Initial Assessment 12/04/2018: This is a 77 year old right-handed woman with a history of diet-controlled hypertension, hyperlipidemia, presenting for evaluation  of memory loss. Her sister Erin Wall is present during the visit to provide additional information. The patient reports that her memory is not bad, but not as sharp. She lives alone. She denies getting lost driving. She reports taking medications regularly. She reports missing a bill payment in Feb/March. She forgets names but not very often. She occasionally misplaces things. Her sister provides a different history. Erin Wall reports the patient started having memory changes a year ago. She is more forgetful, worse in the evening hours. She has lost her keys several times. In Feb/March,  utilities were cut off, including her electricity, cable, and cell phone, she had bills up to $300-400, family helped her fix this and put everything on draft. She was getting lost driving Erin Wall disclosed this privately). Erin Wall reports she gets very confused when she wakes up from dreams. She states she is having dreams. She has sundowning, getting confused and having difficulty understanding what people are telling her. She knows what she wants to say but gets tangled up getting them out. Over the past 1-2 months, she started having hallucinations, telling her sister that there are children in the house or someone had come or their brother who lives out of town is there. She loses things and says somebody got her shoes and found it in her car. She states there was a bazaar that occurred. She is independent with dressing and bathing, no hygiene concerns. Her father had dementia in his 75s. She denies any history of significant head injuries or alcohol use.   She has urinary frequency. She denies any headaches, dizziness, diplopia, dysarthria, dysphagia, neck/back pain, focal numbness/tingling/weakness, bowel dysfunction. No anosmia, tremors, no falls. Sleep is good. She fell a few months ago on her deck and injured her knee. She states her mood is great. She is a retired Pharmacist, [employer].   I personally reviewed head CT without contrast done 09/2018 which did not show any acute changes. It was noted that the ventricles are rather prominent compared to sulci. While this finding may be due to atrophy, a degree of communicating hydrocephalus cannot be excluded on this study.    Prior medications: Donepezil (side effects)   Neuropsychological testing in June 2021 showed prominent areas of weakness surrounding learning and memory, cognitive flexibility, and expressive language. Diagnosis of Major Neurocognitive Disorder (ie dementia) likely due to Alzheimer's disease. It was noted that  Lewy body dementia could not be ruled out, she does have hallucinations however did not have any other associated symptoms with intact visuoconstructional abilities on testing   PREVIOUS MEDICATIONS:   CURRENT MEDICATIONS:  Outpatient Encounter Medications as of 04/20/2022  Medication Sig   EPINEPHrine 0.3 mg/0.3 mL IJ SOAJ injection Inject 0.3 mLs (0.3 mg total) into the muscle as needed for anaphylaxis.   LORazepam (ATIVAN) 0.5 MG tablet Take 1 tablet (0.5 mg total) by mouth 2 (two) times daily as needed for anxiety or sleep.   XARELTO 20 MG TABS tablet TAKE 1 TABLET BY MOUTH ONCE DAILY WITH SUPPER   cephALEXin (KEFLEX) 500 MG capsule Take 1 capsule (500 mg total) by mouth 2 (two) times daily. (Patient not taking: Reported on 04/20/2022)   mirtazapine (REMERON) 30 MG tablet Take 30 mg by mouth at bedtime. (Patient not taking: Reported on 04/20/2022)   No facility-administered encounter medications on file as of 04/20/2022.       04/18/2021    2:00 PM  MMSE - Mini Mental State Exam  Orientation to time 1  Orientation to Place 2  Registration 3  Attention/ Calculation 5  Recall 0  Language- name 2 objects 2  Language- repeat 1  Language- follow 3 step command 3  Language- read & follow direction 1  Write a sentence 1  Copy design 0  Total score 19       No data to display          Objective:     PHYSICAL EXAMINATION:    VITALS:   Vitals:   04/20/22 1053 04/20/22 1132  BP: (!) 174/90 (!) 148/77  Pulse: 88   Resp: 20   SpO2: 96%   Weight: 216 lb (98 kg)   Height: '5\' 10"'$  (1.778 m)     GEN:  The patient appears stated age and is in NAD. HEENT:  Normocephalic, atraumatic.   Neurological examination:  General: NAD, well-groomed, appears stated age. Orientation: The patient is alert. Oriented to person, unable to tell place or date. Cranial nerves: There is good facial symmetry.The speech is fluent and clear. No aphasia or dysarthria. Fund of knowledge is reduced.   Recent and remote memory are impaired. Attention and concentration are reduced.  Able to name objects and repeat phrases.  Hearing is intact to conversational tone.  Sensation: Sensation is intact to light touch throughout Motor: Strength is at least antigravity x4. DTR's 2/4 in UE/LE     Movement examination: Tone: There is normal tone in the UE/LE Abnormal movements:  no tremor.  No myoclonus.  No asterixis.   Coordination:  There is no decremation with RAM's. Normal finger to nose  Gait and Station: The patient has some difficulty arising out of a deep-seated chair without the use of the hands. The patient's stride length is good.  Gait is cautious and narrow.    Thank you for allowing [place] the opportunity to participate in the care of this nice patient. Please do not hesitate to contact us for any questions or concerns.   Total time spent on today's visit was 27 minutes dedicated to this patient today, preparing to see patient, examining the patient, ordering tests and/or medications and counseling the patient, documenting clinical information in the EHR or other health record, independently interpreting results and communicating results to the patient/family, discussing treatment and goals, answering patient's questions and coordinating care.  Cc:  Ouida Sills [facility] 04/20/2022 12:36 PM

## 2022-05-03 DIAGNOSIS — I10 Essential (primary) hypertension: Secondary | ICD-10-CM | POA: Diagnosis not present

## 2022-05-03 DIAGNOSIS — R0902 Hypoxemia: Secondary | ICD-10-CM | POA: Diagnosis not present

## 2022-05-03 DIAGNOSIS — M25572 Pain in left ankle and joints of left foot: Secondary | ICD-10-CM | POA: Diagnosis not present

## 2022-05-03 DIAGNOSIS — M25551 Pain in right hip: Secondary | ICD-10-CM | POA: Diagnosis not present

## 2022-05-03 IMAGING — US US EXTREM LOW VENOUS*L*
1 series · 13 of 24 positions shown · non-contrast
Comparison: None.

CLINICAL DATA: 73-year-old female with a history of superficial
venous thrombosis of the small saphenous vein. Evaluate for
propagation.



[Series 1: us venous img lower uni left (dvt) · portal-venous · 13 of 40 slices shown]
[im 1/40]
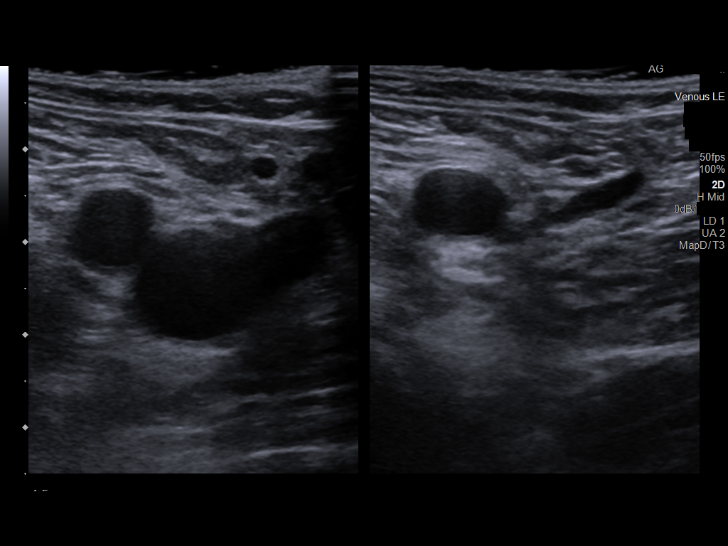
[im 4/40]
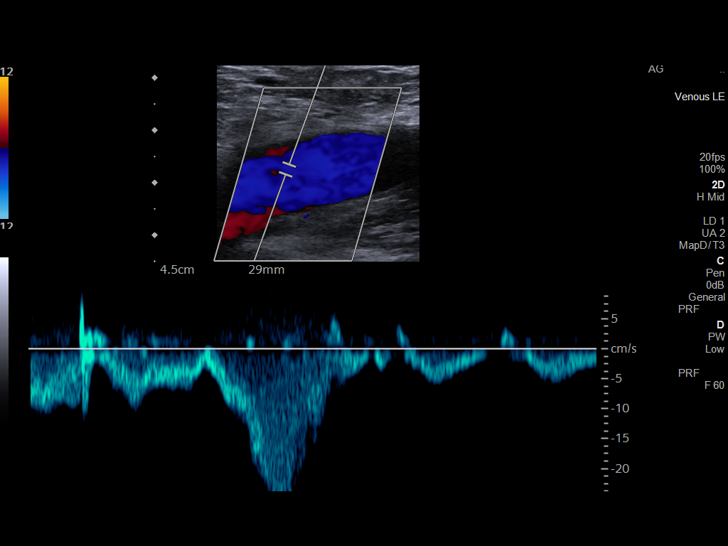
[im 7/40]
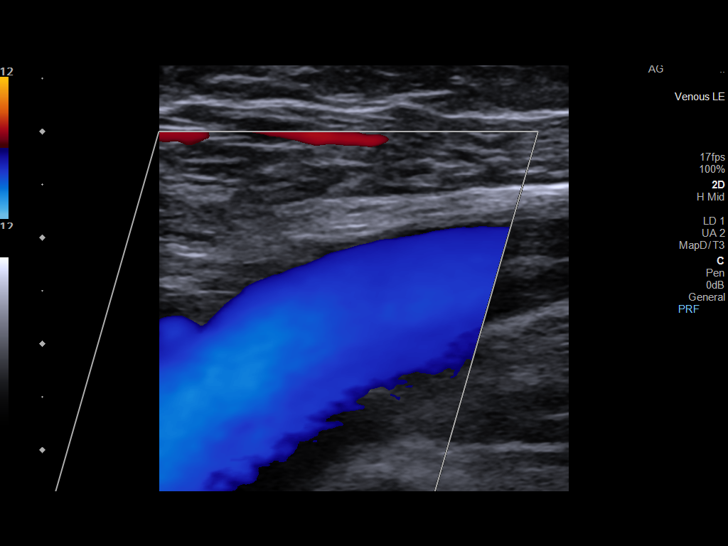
[im 11/40]
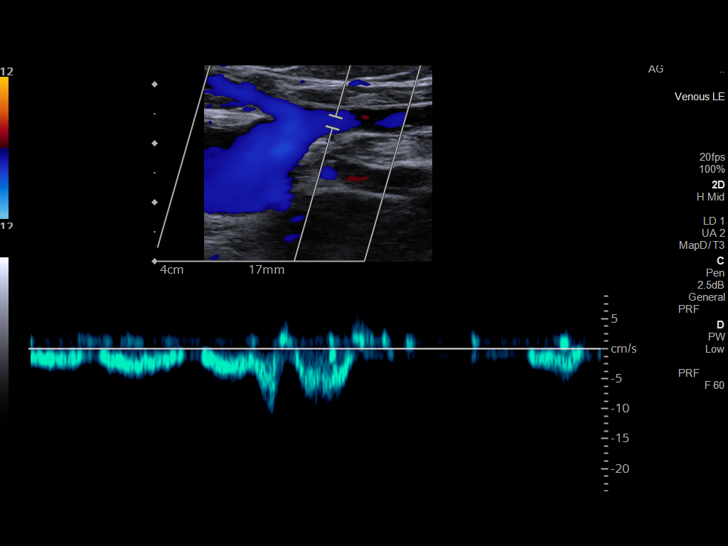
[im 14/40]
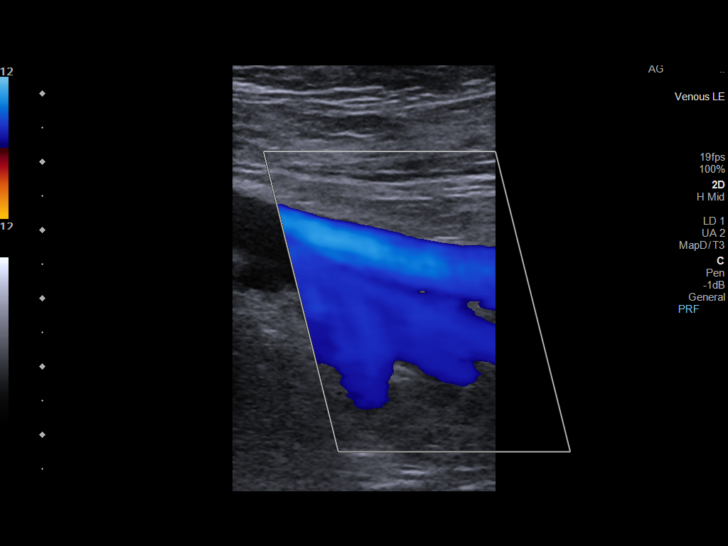
[im 17/40]
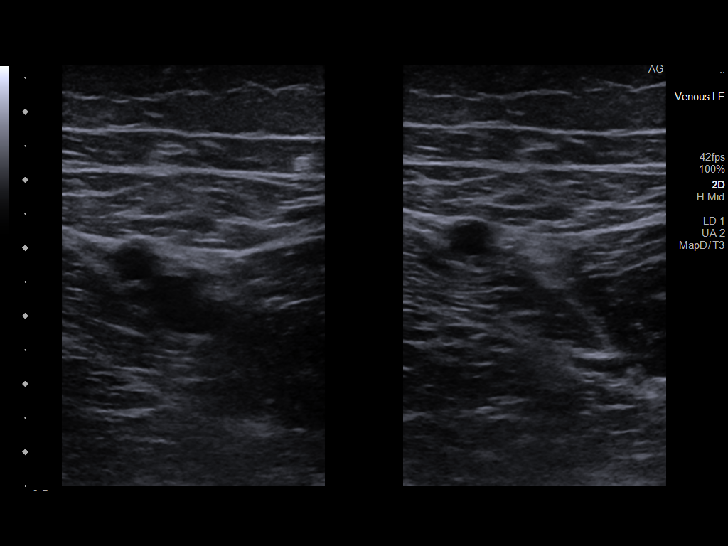
[im 21/40]
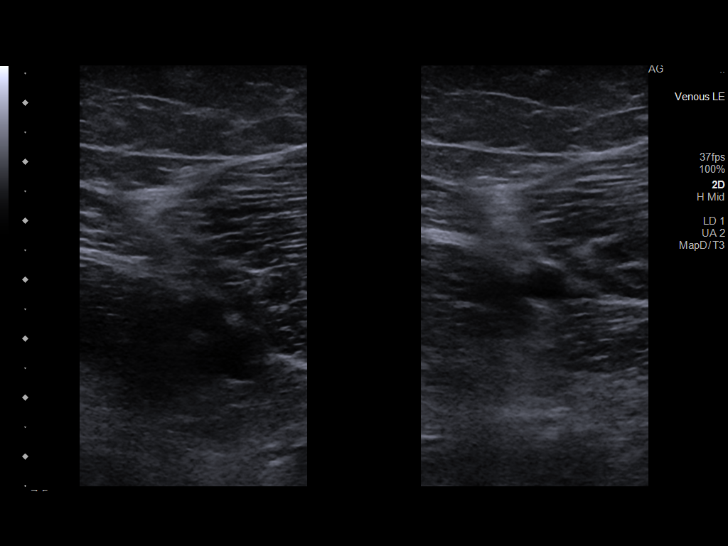
[im 23/40]
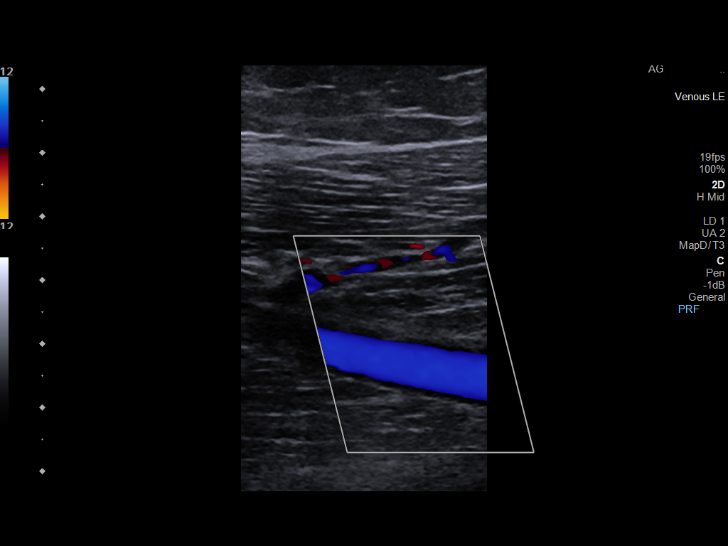
[im 26/40]
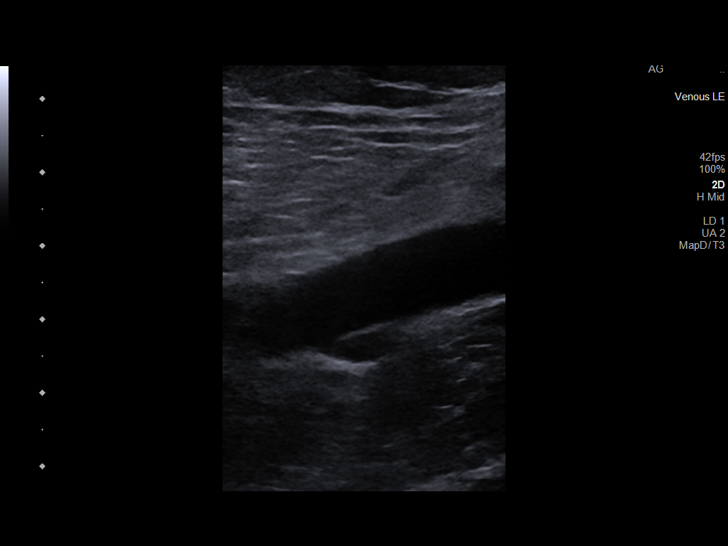
[im 29/40]
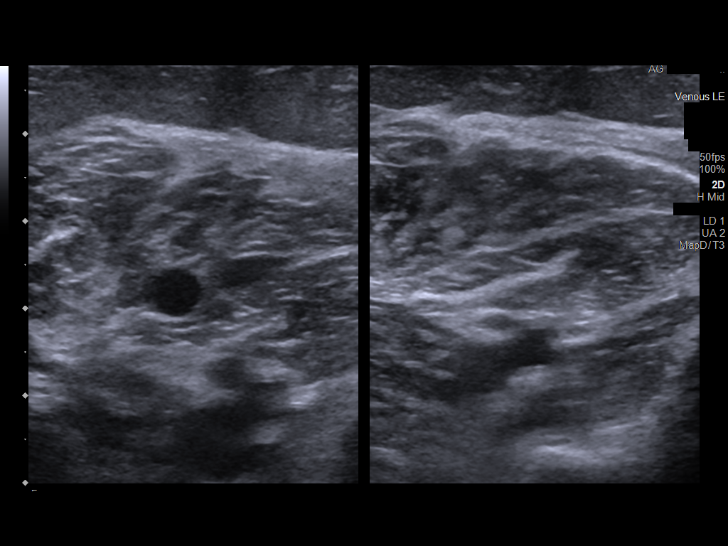
[im 33/40]
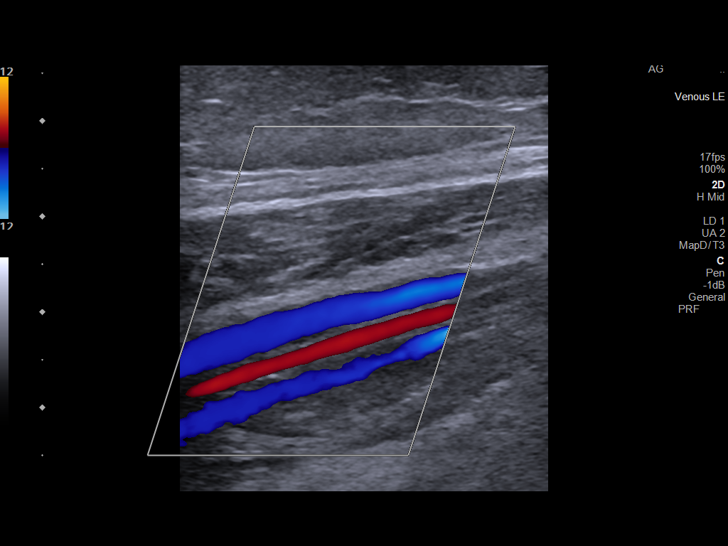
[im 36/40]
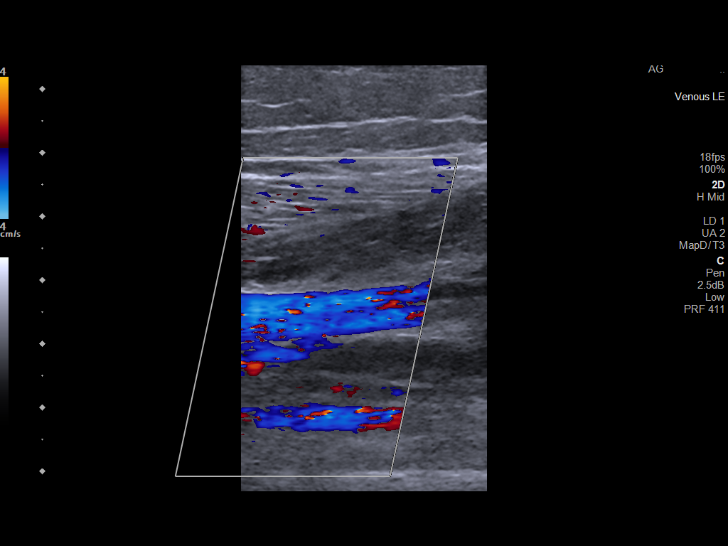
[im 40/40]
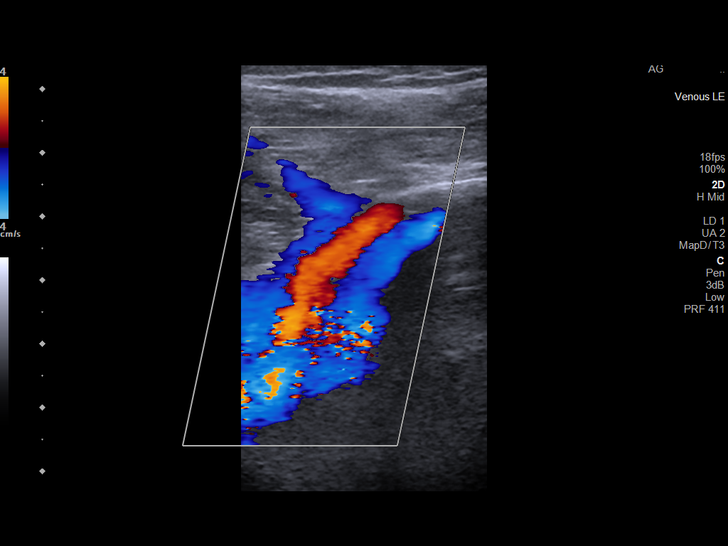

[13 of 24 positions shown; findings below may reference images not displayed]

FINDINGS: Contralateral Common Femoral Vein: Respiratory phasicity is normal
and symmetric with the symptomatic side. No evidence of thrombus.
Normal compressibility.

Common Femoral Vein: No evidence of thrombus. Normal
compressibility, respiratory phasicity and response to augmentation.

Saphenofemoral Junction: No evidence of thrombus. Normal
compressibility and flow on color Doppler imaging.

Profunda Femoral Vein: No evidence of thrombus. Normal
compressibility and flow on color Doppler imaging.

Femoral Vein: No evidence of thrombus. Normal compressibility,
respiratory phasicity and response to augmentation.

Popliteal Vein: No evidence of thrombus. Normal compressibility,
respiratory phasicity and response to augmentation.

Calf Veins: The posterior tibial veins are patent and compressible.
Color Doppler demonstrates flow throughout the paired veins.
However, 1 of the paired peroneal veins appears to be
noncompressible and demonstrates no evidence of color flow on color
Doppler imaging. Findings are concerning for isolated calf DVT.

Superficial Great Saphenous Vein: No evidence of thrombus. Normal
compressibility.

Venous Reflux:  None.

Other Findings:  The small saphenous vein was not imaged.
IMPRESSION: Interval development of isolated calf vein deep venous thrombosis
involving 1 of the paired peroneal veins.

## 2022-05-04 ENCOUNTER — Encounter (HOSPITAL_COMMUNITY): Payer: Self-pay

## 2022-05-04 ENCOUNTER — Emergency Department (HOSPITAL_COMMUNITY): Payer: Medicare HMO

## 2022-05-04 ENCOUNTER — Telehealth: Payer: Self-pay | Admitting: Family Medicine

## 2022-05-04 ENCOUNTER — Other Ambulatory Visit: Payer: Self-pay

## 2022-05-04 ENCOUNTER — Emergency Department (HOSPITAL_COMMUNITY)
Admission: EM | Admit: 2022-05-04 | Discharge: 2022-05-04 | Disposition: A | Discharge status: Home / Self Care | Payer: Medicare HMO | Attending: Emergency Medicine | Admitting: Emergency Medicine

## 2022-05-04 DIAGNOSIS — Z1152 Encounter for screening for COVID-19: Secondary | ICD-10-CM | POA: Insufficient documentation

## 2022-05-04 DIAGNOSIS — S0990XA Unspecified injury of head, initial encounter: Secondary | ICD-10-CM | POA: Diagnosis not present

## 2022-05-04 DIAGNOSIS — R102 Pelvic and perineal pain: Secondary | ICD-10-CM | POA: Diagnosis not present

## 2022-05-04 DIAGNOSIS — S79912A Unspecified injury of left hip, initial encounter: Secondary | ICD-10-CM | POA: Diagnosis not present

## 2022-05-04 DIAGNOSIS — F0390 Unspecified dementia without behavioral disturbance: Secondary | ICD-10-CM | POA: Diagnosis not present

## 2022-05-04 DIAGNOSIS — R410 Disorientation, unspecified: Secondary | ICD-10-CM | POA: Insufficient documentation

## 2022-05-04 DIAGNOSIS — R000 Tachycardia, unspecified: Secondary | ICD-10-CM | POA: Diagnosis not present

## 2022-05-04 DIAGNOSIS — M25552 Pain in left hip: Secondary | ICD-10-CM | POA: Diagnosis not present

## 2022-05-04 DIAGNOSIS — W19XXXA Unspecified fall, initial encounter: Secondary | ICD-10-CM | POA: Diagnosis not present

## 2022-05-04 DIAGNOSIS — R509 Fever, unspecified: Secondary | ICD-10-CM | POA: Diagnosis not present

## 2022-05-04 DIAGNOSIS — R531 Weakness: Secondary | ICD-10-CM | POA: Insufficient documentation

## 2022-05-04 DIAGNOSIS — A419 Sepsis, unspecified organism: Secondary | ICD-10-CM | POA: Diagnosis not present

## 2022-05-04 DIAGNOSIS — G9341 Metabolic encephalopathy: Secondary | ICD-10-CM | POA: Diagnosis present

## 2022-05-04 DIAGNOSIS — I10 Essential (primary) hypertension: Secondary | ICD-10-CM | POA: Insufficient documentation

## 2022-05-04 LAB — RESP PANEL BY RT-PCR (RSV, FLU A&B, COVID)  RVPGX2
Influenza A by PCR: NEGATIVE
Influenza B by PCR: NEGATIVE
Resp Syncytial Virus by PCR: NEGATIVE
SARS Coronavirus 2 by RT PCR: NEGATIVE

## 2022-05-04 LAB — URINALYSIS, ROUTINE W REFLEX MICROSCOPIC
Bacteria, UA: NONE SEEN
Bilirubin Urine: NEGATIVE
Glucose, UA: NEGATIVE mg/dL
Ketones, ur: NEGATIVE mg/dL
Leukocytes,Ua: NEGATIVE
Nitrite: NEGATIVE
Protein, ur: NEGATIVE mg/dL
Specific Gravity, Urine: 1.003 — ABNORMAL LOW (ref 1.005–1.030)
pH: 7 (ref 5.0–8.0)

## 2022-05-04 LAB — CBC WITH DIFFERENTIAL/PLATELET
Abs Immature Granulocytes: 0.03 10*3/uL (ref 0.00–0.07)
Basophils Absolute: 0 10*3/uL (ref 0.0–0.1)
Basophils Relative: 0 %
Eosinophils Absolute: 0.1 10*3/uL (ref 0.0–0.5)
Eosinophils Relative: 1 %
HCT: 41.9 % (ref 36.0–46.0)
Hemoglobin: 13.8 g/dL (ref 12.0–15.0)
Immature Granulocytes: 0 %
Lymphocytes Relative: 14 %
Lymphs Abs: 1.2 10*3/uL (ref 0.7–4.0)
MCH: 28.2 pg (ref 26.0–34.0)
MCHC: 32.9 g/dL (ref 30.0–36.0)
MCV: 85.5 fL (ref 80.0–100.0)
Monocytes Absolute: 0.8 10*3/uL (ref 0.1–1.0)
Monocytes Relative: 10 %
Neutro Abs: 6.3 10*3/uL (ref 1.7–7.7)
Neutrophils Relative %: 75 %
Platelets: 181 10*3/uL (ref 150–400)
RBC: 4.9 MIL/uL (ref 3.87–5.11)
RDW: 14.2 % (ref 11.5–15.5)
WBC: 8.4 10*3/uL (ref 4.0–10.5)
nRBC: 0 % (ref 0.0–0.2)

## 2022-05-04 LAB — BASIC METABOLIC PANEL
Anion gap: 7 (ref 5–15)
BUN: 12 mg/dL (ref 8–23)
CO2: 22 mmol/L (ref 22–32)
Calcium: 9 mg/dL (ref 8.9–10.3)
Chloride: 107 mmol/L (ref 98–111)
Creatinine, Ser: 1.09 mg/dL — ABNORMAL HIGH (ref 0.44–1.00)
GFR, Estimated: 53 mL/min — ABNORMAL LOW (ref >60)
Glucose, Bld: 127 mg/dL — ABNORMAL HIGH (ref 70–99)
Potassium: 3.7 mmol/L (ref 3.5–5.1)
Sodium: 136 mmol/L (ref 135–145)

## 2022-05-04 LAB — APTT: aPTT: 27 seconds (ref 24–36)

## 2022-05-04 LAB — LACTIC ACID, PLASMA: Lactic Acid, Venous: 1.6 mmol/L (ref 0.5–1.9)

## 2022-05-04 LAB — PROTIME-INR
INR: 1.2 (ref 0.8–1.2)
Prothrombin Time: 15.1 seconds (ref 11.4–15.2)

## 2022-05-04 MED ORDER — VANCOMYCIN HCL 2000 MG/400ML IV SOLN
2000.0000 mg | Freq: Once | INTRAVENOUS | Status: AC
  Administered 2022-05-04: 2000 mg via INTRAVENOUS
  Filled 2022-05-04: qty 400

## 2022-05-04 MED ORDER — ACETAMINOPHEN 500 MG PO TABS
1000.0000 mg | ORAL_TABLET | Freq: Once | ORAL | Status: AC
  Administered 2022-05-04: 1000 mg via ORAL
  Filled 2022-05-04: qty 2

## 2022-05-04 MED ORDER — SODIUM CHLORIDE 0.9 % IV SOLN
2.0000 g | Freq: Once | INTRAVENOUS | Status: AC
  Administered 2022-05-04: 2 g via INTRAVENOUS
  Filled 2022-05-04: qty 20

## 2022-05-04 MED ORDER — SODIUM CHLORIDE 0.9 % IV BOLUS
1000.0000 mL | Freq: Once | INTRAVENOUS | Status: AC
  Administered 2022-05-04: 1000 mL via INTRAVENOUS

## 2022-05-04 NOTE — Progress Notes (Signed)
I was asked to admit patient due to acute febrile illness and left hip pain due to fall sustained at home, however, family wanted patient to be discharged from the ED, this was discussed with the ED physician who reevaluated patient and spoke with the family, it was decided that patient will be discharged from the ED at this time.

## 2022-05-04 NOTE — Telephone Encounter (Signed)
Error please close made appointment instead °

## 2022-05-04 NOTE — ED Notes (Signed)
Patient ambulated in room with minimal assistance. Patient turned and placed herself into bed by herself . EDP made aware

## 2022-05-04 NOTE — ED Triage Notes (Signed)
Pt BIB EMS for a fall earlier today. Pt has dementia at baseline. Pt c/o left hip pain and per family pt has not been able to bear weight since fall. Family also worried about pt having a UTI. Fall was unwitnessed.

## 2022-05-04 NOTE — ED Notes (Signed)
Family at bedside who states if patient can walk good later they wish for her to be d/c home with outpatient resources for physical therapy at home. MD made aware.

## 2022-05-04 NOTE — ED Provider Notes (Addendum)
[facility] Emergency Department Provider Note MRN:  [id number]  Arrival date & time: 05/04/22     Chief Complaint   Fall   History of Present Illness   Erin Wall is a 77 y.o. year-old female with a history of hypertension, DVT, dementia presenting to the ED with chief complaint of fall.  Fall today, left hip pain, also with increased confusion over the past few days.  At times seems like she has forgotten how to walk.  Fever today as well.  Review of Systems  A thorough review of systems was obtained and all systems are negative except as noted in the HPI and PMH.   Patient's Health History    Past Medical History:  Diagnosis Date   Arthritis 05/09/2012   Borderline diabetes    DVT (deep venous thrombosis) (HCC)    Generalized anxiety disorder    GERD (gastroesophageal reflux disease)    Hematuria 12/07/2014   Hemorrhoids    Hyperlipemia    Hyperlipidemia 05/09/2012   Hypertension    Impaired fasting glucose 11/15/2012   Lung nodules    Lymphocytosis 05/09/2012   Major neurocognitive disorder due to Alzheimer's disease, possible 08/13/2019   Postmenopausal 12/07/2014   Tendonitis    Unilateral primary osteoarthritis, right knee 01/17/2019   Urinary frequency 12/07/2014    Past Surgical History:  Procedure Laterality Date   ABDOMINAL HYSTERECTOMY     BACTERIAL OVERGROWTH TEST  01/01/2012   Procedure: BACTERIAL OVERGROWTH TEST;  Surgeon: Danie Binder, MD;  Location: [facility];  Service: Endoscopy;  Laterality: N/A;  7:30   BREAST BIOPSY     BREAST BIOPSY Right 01/26/2022   [place] RT BREAST BX W LOC DEV 1ST LESION IMG BX SPEC US GUIDE 01/26/2022 [facility]-ULTRASOUND   BREAST EXCISIONAL BIOPSY Left 1978   benign   BREAST SURGERY     tumor removed   COLONOSCOPY  2004   COLONOSCOPY  09/20/2010   [id number] polyps,internal hemorrhoids/polypoid lesion in the cecum   ESOPHAGOGASTRODUODENOSCOPY  09/20/2010   [id number] gastritis    Family  History  Problem Relation Age of Onset   Hypertension Mother    Diabetes Mother    Hypertension Father    Diabetes Father    Heart attack Father    Stroke Father    Cancer Brother    Colon cancer Neg Hx    Colon polyps Neg Hx     Social History   Socioeconomic History   Marital status: Single    Spouse name: Not on file   Number of children: Not on file   Years of education: 18   Highest education level: Master's degree (e.g., MA, MS, MEng, MEd, MSW, MBA)  Occupational History   Occupation: retired Pharmacist, hospital   Tobacco Use   Smoking status: Never   Smokeless tobacco: Never  Vaping Use   Vaping Use: Never used  Substance and Sexual Activity   Alcohol use: No   Drug use: No   Sexual activity: Not Currently    Birth control/protection: Surgical, Post-menopausal    Comment: hyst  Other Topics Concern   Not on file  Social History Narrative   Lives one level lives alone   Right handed   Caffeine - not much    Exercise - some   Education - masters   Social Determinants of Health   Financial Resource Strain: Low Risk  (05/27/2020)   Overall Financial Resource Strain (CARDIA)    Difficulty of Paying Living Expenses: Not  hard at all  Food Insecurity: No Food Insecurity (05/27/2020)   Hunger Vital Sign    Worried About Running Out of Food in the Last Year: Never true    Ran Out of Food in the Last Year: Never true  Transportation Needs: No Transportation Needs (05/27/2020)   PRAPARE - Hydrologist (Medical): No    Lack of Transportation (Non-Medical): No  Physical Activity: Inactive (05/27/2020)   Exercise Vital Sign    Days of Exercise per Week: 0 days    Minutes of Exercise per Session: 0 min  Stress: Stress Concern Present (05/27/2020)   [place]    Feeling of Stress : To some extent  Social Connections: Socially Isolated (05/27/2020)   Social Connection and Isolation Panel  [NHANES]    Frequency of Communication with Friends and Family: More than three times a week    Frequency of Social Gatherings with Friends and Family: More than three times a week    Attends Religious Services: Never    Marine scientist or Organizations: No    Attends Archivist Meetings: Never    Marital Status: Never married  Intimate Partner Violence: Not At Risk (05/27/2020)   Humiliation, Afraid, Rape, and Kick questionnaire    Fear of Current or Ex-Partner: No    Emotionally Abused: No    Physically Abused: No    Sexually Abused: No     Physical Exam   Vitals:   05/04/22 0630 05/04/22 0630  BP: 135/75   Pulse: 82   Resp: 19   Temp:  98.1 F (36.7 C)  SpO2: 98%     CONSTITUTIONAL: Well-appearing, NAD NEURO/PSYCH:  Alert and oriented to name, moves all extremities equally EYES:  eyes equal and reactive ENT/NECK:  no LAD, no JVD CARDIO: Tachycardic rate, well-perfused, normal S1 and S2 PULM:  CTAB no wheezing or rhonchi GI/GU:  non-distended, non-tender MSK/SPINE:  No gross deformities, no edema SKIN:  no rash, atraumatic   *Additional and/or pertinent findings included in MDM below  Diagnostic and Interventional Summary    EKG Interpretation  Date/Time:  Thursday May 04 2022 00:34:08 EDT Ventricular Rate:  104 PR Interval:  152 QRS Duration: 85 QT Interval:  342 QTC Calculation: 450 R Axis:   26 Text Interpretation: Sinus tachycardia Atrial premature complex Probable left atrial enlargement Abnormal R-wave progression, early transition Confirmed by Gerlene Fee [phone]) on 05/04/2022 1:20:07 AM       Labs Reviewed  BASIC METABOLIC PANEL - Abnormal; Notable for the following components:      Result Value   Glucose, Bld 127 (*)    Creatinine, Ser 1.09 (*)    GFR, Estimated 53 (*)    All other components within normal limits  URINALYSIS, ROUTINE W REFLEX MICROSCOPIC - Abnormal; Notable for the following components:   Color, Urine  COLORLESS (*)    Specific Gravity, Urine 1.003 (*)    Hgb urine dipstick MODERATE (*)    All other components within normal limits  CULTURE, BLOOD (SINGLE)  RESP PANEL BY RT-PCR (RSV, FLU A&B, COVID)  [id number]  CULTURE, BLOOD (SINGLE)  CBC WITH DIFFERENTIAL/PLATELET  LACTIC ACID, PLASMA  PROTIME-INR  APTT    CT Hip Left Wo Contrast  Final Result    DG Chest Port 1 View  Final Result    DG Pelvis Portable  Final Result    CT HEAD WO CONTRAST (5MM)  Final Result  Medications  vancomycin (VANCOREADY) IVPB 2000 mg/400 mL ( Intravenous Infusion Verify 05/04/22 0508)  acetaminophen (TYLENOL) tablet 1,000 mg (1,000 mg Oral Given 05/04/22 0140)  sodium chloride 0.9 % bolus 1,000 mL (0 mLs Intravenous Stopped 05/04/22 0250)  cefTRIAXone (ROCEPHIN) 2 g in sodium chloride 0.9 % 100 mL IVPB (0 g Intravenous Stopped 05/04/22 0250)     Procedures  /  Critical Care Procedures  ED Course and Medical Decision Making  Initial Impression and Ddx Differential diagnosis includes UTI, sepsis, occult fracture of the hip, pneumonia, electrolyte disturbance.  Past medical/surgical history that increases complexity of ED encounter: Dementia  Interpretation of Diagnostics I personally reviewed the EKG and my interpretation is as follows: Sinus tachycardia  Labs overall reassuring with no significant blood count or electrolyte disturbance.  Urinalysis normal.  Patient Reassessment and Ultimate Disposition/Management     Awaiting CT to evaluate for signs of occult hip fracture.  Suspect viral illness causing fever causing weakness and worsening cognitive function/confusion.  Will admit to medicine.  6 AM update: Family thinks patient seems more at her baseline.  Wondering if it would be okay if they could take her home so long as she is able to ambulate.  Overall this seems like a reasonable plan.  Patient has not had any return of fever since her triage vital signs, no tachycardia, no blood  pressure concerns, no hypoxia.  Really no signs of a bacterial source of the fever.  Definitely no meningismus, soft abdomen, no signs of pneumonia or UTI.  And so favoring viral illness.  On my exam she does seem more lucid, at baseline per family.  She ambulates with minimal assistance, also her baseline.  Admission was still offered and available to the patient and patient's family but they prefer to go home, strict return precautions.  Patient management required discussion with the following services or consulting groups:  Hospitalist Service  Complexity of Problems Addressed Acute illness or injury that poses threat of life of bodily function  Additional Data Reviewed and Analyzed Further history obtained from: Further history from spouse/family member  Additional Factors Impacting ED Encounter Risk Consideration of hospitalization  Barth Kirks. [place] Small, [place] [email or web address]  Final Clinical Impressions(s) / ED Diagnoses     ICD-10-CM   1. Fever, unspecified fever cause  R50.9     2. Weakness  R53.1     3. Confusion  R41.0       ED Discharge Orders     None        Discharge Instructions Discussed with and Provided to Patient:     Discharge Instructions      You were evaluated in the Emergency Department and after careful evaluation, we did not find any emergent condition requiring admission or further testing in the hospital.  Your exam/testing today was overall reassuring.  Symptoms likely due to a viral illness.  Plenty of fluids and rest at home, keep a close eye out for any return of fever.  Recommend close follow-up with primary care doctor.  Please return to the Emergency Department if you experience any worsening of your condition.  Thank you for allowing [place] to be a part of your care.        Maudie Flakes, MD 05/04/22 [id number]    Maudie Flakes, MD 05/04/22 [phone]

## 2022-05-04 NOTE — Discharge Instructions (Signed)
You were evaluated in the Emergency Department and after careful evaluation, we did not find any emergent condition requiring admission or further testing in the hospital.  Your exam/testing today was overall reassuring.  Symptoms likely due to a viral illness.  Plenty of fluids and rest at home, keep a close eye out for any return of fever.  Recommend close follow-up with primary care doctor.  Please return to the Emergency Department if you experience any worsening of your condition.  Thank you for allowing [place] to be a part of your care.

## 2022-05-09 LAB — CULTURE, BLOOD (SINGLE)
Culture: NO GROWTH
Culture: NO GROWTH

## 2022-05-10 ENCOUNTER — Ambulatory Visit (INDEPENDENT_AMBULATORY_CARE_PROVIDER_SITE_OTHER): Payer: Medicare HMO | Admitting: Family Medicine

## 2022-05-10 ENCOUNTER — Encounter: Payer: Self-pay | Admitting: Family Medicine

## 2022-05-10 VITALS — BP 146/86 | Temp 98.3°F | Ht 70.0 in | Wt 214.0 lb

## 2022-05-10 DIAGNOSIS — R509 Fever, unspecified: Secondary | ICD-10-CM | POA: Diagnosis not present

## 2022-05-10 NOTE — Patient Instructions (Signed)
She looks well.  Work up negative.  Call with concerns.  Take care  Dr. Lacinda Axon

## 2022-05-10 NOTE — Assessment & Plan Note (Signed)
Now resolved.  Back to baseline.  Clinically well on exam.  Supportive care.

## 2022-05-10 NOTE — Progress Notes (Signed)
Subjective:  Patient ID: Erin Wall, female    DOB: 1946/01/16  Age: 77 y.o. MRN: [id number]  CC: Chief Complaint  Patient presents with   ER follow up     From fever of unknown etiology , has subsided    HPI:  77 year old female with past medical history of hypertension, Alzheimer disease, history of DVT, hyperlipidemia presents for ER follow-up.  Per the EMR, she presented after suffering a fall.  Sister states that she did not really fall, she slipped downward.  Nevertheless she did have a decrease in appetite and was having some difficulty walking.  She is found to be febrile.  Sister states that Tmax was 103.7.  She had a thorough workup in the ER.  Labs were essentially unrevealing.  Testing for COVID, flu, RSV was negative.  Chest x-ray was negative.  CT imaging of the head as well as the hip was negative for acute abnormalities.  Urinalysis was not consistent with UTI.  Blood cultures were obtained later returned negative.  Patient was treated empirically with antibiotic therapy -vancomycin and ceftriaxone she was given Tylenol for fever with resolution of fever.  After reevaluation family decided to take her home as opposed to stay for admission.  Patient presents today for follow-up.  She is back to her baseline.  She is hungry and ready to eat lunch.  No recurrence of fever.  No complaints at this time.   Patient Active Problem List   Diagnosis Date Noted   Febrile illness 05/10/2022   Urinary incontinence 03/20/2022   Low back pain 03/20/2022   Lower extremity edema 06/07/2021   Alzheimer disease ([place]) 05/27/2020   History of DVT of lower extremity 05/27/2020   S/P hysterectomy 05/27/2020   Unilateral primary osteoarthritis, right knee 01/17/2019   Hyperlipidemia 05/09/2012   Hypertension 05/09/2012    Social Hx   Social History   Socioeconomic History   Marital status: Single    Spouse name: Not on file   Number of children: Not on file   Years of  education: 18   Highest education level: Master's degree (e.g., MA, MS, MEng, MEd, MSW, MBA)  Occupational History   Occupation: retired Pharmacist, hospital   Tobacco Use   Smoking status: Never   Smokeless tobacco: Never  Vaping Use   Vaping Use: Never used  Substance and Sexual Activity   Alcohol use: No   Drug use: No   Sexual activity: Not Currently    Birth control/protection: Surgical, Post-menopausal    Comment: hyst  Other Topics Concern   Not on file  Social History Narrative   Lives one level lives alone   Right handed   Caffeine - not much    Exercise - some   Education - masters   Social Determinants of Health   Financial Resource Strain: Low Risk  (05/27/2020)   Overall Financial Resource Strain (CARDIA)    Difficulty of Paying Living Expenses: Not hard at all  Food Insecurity: No Food Insecurity (05/27/2020)   Hunger Vital Sign    Worried About Running Out of Food in the Last Year: Never true    Ran Out of Food in the Last Year: Never true  Transportation Needs: No Transportation Needs (05/27/2020)   PRAPARE - Hydrologist (Medical): No    Lack of Transportation (Non-Medical): No  Physical Activity: Inactive (05/27/2020)   Exercise Vital Sign    Days of Exercise per Week: 0 days  Minutes of Exercise per Session: 0 min  Stress: Stress Concern Present (05/27/2020)   [place]    Feeling of Stress : To some extent  Social Connections: Socially Isolated (05/27/2020)   Social Connection and Isolation Panel [NHANES]    Frequency of Communication with Friends and Family: More than three times a week    Frequency of Social Gatherings with Friends and Family: More than three times a week    Attends Religious Services: Never    Marine scientist or Organizations: No    Attends Music therapist: Never    Marital Status: Never married    Review of Systems Per  HPI  Objective:  BP (!) 146/86   Temp 98.3 F (36.8 C)   Ht 5\' 10"  (1.778 m)   Wt 214 lb (97.1 kg)   BMI 30.71 kg/m      05/10/2022   10:48 AM 05/04/2022    6:30 AM 05/04/2022    5:00 AM  BP/Weight  Systolic BP [id number]  Diastolic BP 86 75 65  Wt. (Lbs) 214    BMI 30.71 kg/m2      Physical Exam Vitals and nursing note reviewed.  Constitutional:      General: She is not in acute distress.    Appearance: Normal appearance.  HENT:     Head: Normocephalic and atraumatic.  Eyes:     General:        Right eye: No discharge.        Left eye: No discharge.     Conjunctiva/sclera: Conjunctivae normal.  Cardiovascular:     Rate and Rhythm: Normal rate and regular rhythm.  Pulmonary:     Effort: Pulmonary effort is normal.     Breath sounds: Normal breath sounds.  Neurological:     Mental Status: She is alert. Mental status is at baseline.     Lab Results  Component Value Date   WBC 8.4 05/04/2022   HGB 13.8 05/04/2022   HCT 41.9 05/04/2022   PLT 181 05/04/2022   GLUCOSE 127 (H) 05/04/2022   CHOL 204 (H) 04/19/2021   TRIG 52 04/19/2021   HDL 68 04/19/2021   LDLCALC 127 (H) 04/19/2021   ALT 17 10/14/2021   AST 26 10/14/2021   NA 136 05/04/2022   K 3.7 05/04/2022   CL 107 05/04/2022   CREATININE 1.09 (H) 05/04/2022   BUN 12 05/04/2022   CO2 22 05/04/2022   TSH 0.661 09/23/2018   INR 1.2 05/04/2022   HGBA1C 5.7 (H) 11/10/2016     Assessment & Plan:   Problem List Items Addressed This Visit       Other   Febrile illness - Primary    Now resolved.  Back to baseline.  Clinically well on exam.  Supportive care.       Follow-up:  As previously scheduled.  [place]

## 2022-06-20 ENCOUNTER — Ambulatory Visit: Payer: Medicare HMO | Admitting: Family Medicine

## 2022-07-03 NOTE — Progress Notes (Signed)
Georgi Navarrete M. Nailyn Dearinger, MD [place] [place] [facility] [email or web address]  

## 2022-07-17 ENCOUNTER — Other Ambulatory Visit: Payer: Self-pay | Admitting: Family Medicine

## 2022-08-09 ENCOUNTER — Other Ambulatory Visit (INDEPENDENT_AMBULATORY_CARE_PROVIDER_SITE_OTHER): Payer: Medicare HMO

## 2022-08-09 ENCOUNTER — Other Ambulatory Visit: Payer: Self-pay

## 2022-08-09 DIAGNOSIS — R300 Dysuria: Secondary | ICD-10-CM

## 2022-08-09 LAB — POCT URINALYSIS DIP (CLINITEK)
Bilirubin, UA: NEGATIVE
Glucose, UA: NEGATIVE mg/dL
Ketones, POC UA: NEGATIVE mg/dL
Leukocytes, UA: NEGATIVE
Nitrite, UA: NEGATIVE
Spec Grav, UA: 1.02 (ref 1.010–1.025)
Urobilinogen, UA: 0.2 E.U./dL
pH, UA: 5 (ref 5.0–8.0)

## 2022-08-12 LAB — URINE CULTURE

## 2022-08-12 LAB — SPECIMEN STATUS REPORT

## 2022-08-13 ENCOUNTER — Other Ambulatory Visit: Payer: Self-pay | Admitting: Family Medicine

## 2022-08-13 MED ORDER — AMOXICILLIN-POT CLAVULANATE 875-125 MG PO TABS: 1.0000 | ORAL_TABLET | Freq: Two times a day (BID) | ORAL | 0 refills | Status: DC

## 2022-10-24 ENCOUNTER — Ambulatory Visit: Payer: Medicare HMO | Admitting: Physician Assistant

## 2022-10-24 ENCOUNTER — Encounter: Payer: Self-pay | Admitting: Physician Assistant

## 2022-10-24 VITALS — BP 149/76 | HR 77 | Resp 18 | Wt 210.0 lb

## 2022-10-24 DIAGNOSIS — F02818 Dementia in other diseases classified elsewhere, unspecified severity, with other behavioral disturbance: Secondary | ICD-10-CM | POA: Diagnosis not present

## 2022-10-24 DIAGNOSIS — G301 Alzheimer's disease with late onset: Secondary | ICD-10-CM

## 2022-10-24 NOTE — Patient Instructions (Signed)
Good to see you.   Continue mirtazapine to help with sleep.  Recommend walker  or cane to prevent falls  Continue close supervision Monitor urine for UTI signs   3. Follow-up in 6 months, call for any changes   FALL PRECAUTIONS: Be cautious when walking. Scan the area for obstacles that may increase the risk of trips and falls. When getting up in the mornings, sit up at the edge of the bed for a few minutes before getting out of bed. Consider elevating the bed at the head end to avoid drop of blood pressure when getting up. Walk always in a well-lit room (use night lights in the walls). Avoid area rugs or power cords from appliances in the middle of the walkways. Use a walker or a cane if necessary and consider physical therapy for balance exercise. Get your eyesight checked regularly.  HOME SAFETY: Consider the safety of the kitchen when operating appliances like stoves, microwave oven, and blender. Consider having supervision and share cooking responsibilities until no longer able to participate in those. Accidents with firearms and other hazards in the house should be identified and addressed as well.  ABILITY TO BE LEFT ALONE: If patient is unable to contact 911 operator, consider using LifeLine, or when the need is there, arrange for someone to stay with patients. Smoking is a fire hazard, consider supervision or cessation. Risk of wandering should be assessed by caregiver and if detected at any point, supervision and safe proof recommendations should be instituted.   RECOMMENDATIONS FOR ALL PATIENTS WITH MEMORY PROBLEMS: 1. Continue to exercise (Recommend 30 minutes of walking everyday, or 3 hours every week) 2. Increase social interactions - continue going to [place] and enjoy social gatherings with friends and family 3. Eat healthy, avoid fried foods and eat more fruits and vegetables 4. Maintain adequate blood pressure, blood sugar, and blood cholesterol level. Reducing the risk of  stroke and cardiovascular disease also helps promoting better memory. 5. Avoid stressful situations. Live a simple life and avoid aggravations. Organize your time and prepare for the next day in anticipation. 6. Sleep well, avoid any interruptions of sleep and avoid any distractions in the bedroom that may interfere with adequate sleep quality 7. Avoid sugar, avoid sweets as there is a strong link between excessive sugar intake, diabetes, and cognitive impairment The Mediterranean diet has been shown to help patients reduce the risk of progressive memory disorders and reduces cardiovascular risk. This includes eating fish, eat fruits and green leafy vegetables, nuts like almonds and hazelnuts, walnuts, and also use olive oil. Avoid fast foods and fried foods as much as possible. Avoid sweets and sugar as sugar use has been linked to worsening of memory function.  There is always a concern of gradual progression of memory problems. If this is the case, then we may need to adjust level of care according to patient needs. Support, both to the patient and caregiver, should then be put into place.

## 2022-10-24 NOTE — Progress Notes (Signed)
Assessment/Plan:   Dementia likely due to Alzheimer's disease with behavioral disturbance ***  Erin Wall is a very pleasant 77 y.o. RH female with a history of HTN, HLD, DVT on Xarelto, OA, and history of Dementia likely due to Alzheimer's Disease with behavioral disturbance as per Neuropsych evaluation 2021 seen today in follow up for memory loss. She is no longer on antidementia meds due to progression of disease, meds no longer therapeutic. ***She requires assistance with her ADLS.  She continues to attend the [employer]. Sleep is controlled with mirtazapine.     Follow up in 6  months. Recommend good control of her cardiovascular risk factors Continue mirtazapine for sleep. Continue to control mood as per PCP  Continue 24/7 supervision.    Subjective:    This patient is accompanied in the office by her daughter who supplements the history.  Previous records as well as any outside records available were reviewed prior to todays visit. Patient was last seen on 04/2022. Unable to perform MMSE due to cognitive decline.***   Any changes in memory since last visit? " A little worse". She is unable to complete tasks. She has difficulty retrieving information, conversations and names.  repeats oneself?  Endorsed Disoriented when walking into a room?  Sometimes she thinks she is at school.  Leaving objects in unusual places? She collects stuff and places it on her bag, such as papers, TV remote, socks, shoes, but not as bas as before etc. ***  Wandering behavior?   Seldom, but daughter is monitoring closely*** Any personality changes since last visit? Occasionally she becomes agitated. She takes Ativan prn.  Any worsening depression?:  Denies.   Hallucinations or paranoia?  She rthinks she is at work an is surrounded by people someties" *** Seizures? denies    Any sleep changes?  She sleeps well. Denies vivid dreams, REM behavior or sleepwalking. She takes remeron to help her  sleep.    Sleep apnea?   Denies.   Any hygiene concerns. She needs to be guided to shower. Most of the time she has to do it for her *** Independent of bathing and dressing?  Endorsed  Daughter assists her Does the patient needs help with medications? Daughter  is in charge *** Who is in charge of the finances?  Daughter is in charge   *** Any changes in appetite?  Sitters and her daughter prepare the meals  ***   Patient have trouble swallowing? Denies.   Does the patient cook? No Any headaches?   denies   Chronic back pain  denies   Ambulates with difficulty? She has slowed down a lot ***. She tried a walker and refused to use it.  Recent falls or head injuries? Denies.  *** Unilateral weakness, numbness or tingling? Denies.   Any tremors?  Denies   Any anosmia?  Denies   Any incontinence of urine?  Endorsed, wears pads. She has had 2 UTIs since last visit  Any bowel dysfunction?   Denies      Patient lives with  *** She has sitters all day for continuous monitoring. Daughter checks with cameras at least 3 times at night Does the patient drive? No longer drives ***   History on Initial Assessment 12/04/2018: This is a 77 year old right-handed woman with a history of diet-controlled hypertension, hyperlipidemia, presenting for evaluation of memory loss. Her sister Erin Wall is present during the visit to provide additional information. The patient reports that her memory is  not bad, but not as sharp. She lives alone. She denies getting lost driving. She reports taking medications regularly. She reports missing a bill payment in Feb/March. She forgets names but not very often. She occasionally misplaces things. Her sister provides a different history. Erin Wall reports the patient started having memory changes a year ago. She is more forgetful, worse in the evening hours. She has lost her keys several times. In Feb/March, utilities were cut off, including her electricity, cable, and cell phone, she  had bills up to $300-400, family helped her fix this and put everything on draft. She was getting lost driving Erin Wall disclosed this privately). Erin Wall reports she gets very confused when she wakes up from dreams. She states she is having dreams. She has sundowning, getting confused and having difficulty understanding what people are telling her. She knows what she wants to say but gets tangled up getting them out. Over the past 1-2 months, she started having hallucinations, telling her sister that there are children in the house or someone had come or their brother who lives out of town is there. She loses things and says somebody got her shoes and found it in her car. She states there was a bazaar that occurred. She is independent with dressing and bathing, no hygiene concerns. Her father had dementia in his 52s. She denies any history of significant head injuries or alcohol use.   She has urinary frequency. She denies any headaches, dizziness, diplopia, dysarthria, dysphagia, neck/back pain, focal numbness/tingling/weakness, bowel dysfunction. No anosmia, tremors, no falls. Sleep is good. She fell a few months ago on her deck and injured her knee. She states her mood is great. 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   I personally reviewed head CT without contrast done 09/2018 which did not show any acute changes. It was noted that the ventricles are rather prominent compared to sulci. While this finding may be due to atrophy, a degree of communicating hydrocephalus cannot be excluded on this study.    Prior medications: Donepezil (side effects), rivastigmine (somnolence)   Neuropsychological testing in June 2021 showed prominent areas of weakness surrounding learning and memory, cognitive flexibility, and expressive language. Diagnosis of Major Neurocognitive Disorder (ie dementia) likely due to Alzheimer's disease. It was noted that Lewy body dementia could not be ruled out, she does  have hallucinations however did not have any other associated symptoms with intact visuoconstructional abilities on testing PREVIOUS MEDICATIONS:   CURRENT MEDICATIONS:  Outpatient Encounter Medications as of 10/24/2022  Medication Sig   amoxicillin-clavulanate (AUGMENTIN) 875-125 MG tablet Take 1 tablet by mouth 2 (two) times daily.   EPINEPHrine 0.3 mg/0.3 mL IJ SOAJ injection Inject 0.3 mLs (0.3 mg total) into the muscle as needed for anaphylaxis.   LORazepam (ATIVAN) 0.5 MG tablet Take 1 tablet (0.5 mg total) by mouth 2 (two) times daily as needed for anxiety or sleep.   mirtazapine (REMERON) 30 MG tablet Take 30 mg by mouth at bedtime.   XARELTO 20 MG TABS tablet TAKE 1 TABLET BY MOUTH ONCE DAILY WITH SUPPER   No facility-administered encounter medications on file as of 10/24/2022.       04/18/2021    2:00 PM  MMSE - Mini Mental State Exam  Orientation to time 1  Orientation to Place 2  Registration 3  Attention/ Calculation 5  Recall 0  Language- name 2 objects 2  Language- repeat 1  Language- follow 3 step command 3  Language- read & follow  direction 1  Write a sentence 1  Copy design 0  Total score 19       No data to display          Objective:     PHYSICAL EXAMINATION:    VITALS:   Vitals:   10/24/22 1115  BP: (!) 163/84  Pulse: 77  Resp: 18  SpO2: 96%  Weight: 210 lb (95.3 kg)    GEN:  The patient appears stated age and is in NAD. HEENT:  Normocephalic, atraumatic.   Neurological examination:  General: NAD, well-groomed, appears stated age. Orientation: The patient is alert. Oriented to person, place and date Cranial nerves: There is good facial symmetry.The speech is fluent and clear. No aphasia or dysarthria. Fund of knowledge is appropriate. Recent and remote memory are impaired. Attention and concentration are reduced.  Able to name objects and repeat phrases.  Hearing is intact to conversational tone. *** Sensation: Sensation is intact to  light touch throughout Motor: Strength is at least antigravity x4. DTR's 2/4 in UE/LE     Movement examination: Tone: There is normal tone in the UE/LE Abnormal movements:  no tremor.  No myoclonus.  No asterixis.   Coordination:  There is no decremation with RAM's. Normal finger to nose  Gait and Station: The patient has no difficulty arising out of a deep-seated chair without the use of the hands. The patient's stride length is good.  Gait is cautious and narrow.    Thank you for allowing [place] the opportunity to participate in the care of this nice patient. Please do not hesitate to contact us for any questions or concerns.   Total time spent on today's visit was *** minutes dedicated to this patient today, preparing to see patient, examining the patient, ordering tests and/or medications and counseling the patient, documenting clinical information in the EHR or other health record, independently interpreting results and communicating results to the patient/family, discussing treatment and goals, answering patient's questions and coordinating care.  Cc:  Yaakov Guthrie [facility] 10/24/2022 11:26 AM

## 2022-11-27 ENCOUNTER — Other Ambulatory Visit: Payer: Self-pay | Admitting: Family Medicine

## 2023-01-02 ENCOUNTER — Other Ambulatory Visit: Payer: Self-pay | Admitting: Physician Assistant

## 2023-01-05 ENCOUNTER — Other Ambulatory Visit: Payer: Self-pay | Admitting: Physician Assistant

## 2023-01-09 ENCOUNTER — Telehealth: Payer: Self-pay

## 2023-01-09 ENCOUNTER — Other Ambulatory Visit: Payer: Self-pay | Admitting: Family Medicine

## 2023-01-09 MED ORDER — MIRTAZAPINE 30 MG PO TABS: 30.0000 mg | ORAL_TABLET | Freq: Every day | ORAL | 3 refills | Status: DC

## 2023-01-09 NOTE — Telephone Encounter (Signed)
Messages are being routed to our office via CRM for refills

## 2023-01-17 ENCOUNTER — Other Ambulatory Visit: Payer: Self-pay | Admitting: Family Medicine

## 2023-03-21 ENCOUNTER — Other Ambulatory Visit: Payer: Self-pay | Admitting: Family Medicine

## 2023-03-21 NOTE — Telephone Encounter (Signed)
 Last Fill: 11/27/22  Last OV: 05/10/22 Next OV: None Scheduled  Routing to provider for review/authorization.

## 2023-03-21 NOTE — Telephone Encounter (Signed)
 Copied from CRM [id number]. Topic: Clinical - Medication Refill >> Mar 21, 2023 10:32 AM Wyona SQUIBB wrote: Most Recent Primary Care Visit:  Provider: BLUFORD JACQULYN MATSU  Department: [facility]  Visit Type: HOSPITAL FU  Date: 05/10/2022  Medication: XARELTO  20 MG TABS tablet   Has the patient contacted their pharmacy? Yes (Agent: If no, request that the patient contact the pharmacy for the refill. If patient does not wish to contact the pharmacy document the reason why and proceed with request.) (Agent: If yes, when and what did the pharmacy advise?)  Is this the correct pharmacy for this prescription? Yes If no, delete pharmacy and type the correct one.  This is the patient's preferred pharmacy:  [facility] [address] [place] - [address] [place] [address] [place] [address] Phone: [phone] Fax: [phone]   Has the prescription been filled recently? No  Is the patient out of the medication? Yes, pt has 2 days left.   Has the patient been seen for an appointment in the last year OR does the patient have an upcoming appointment? Yes  Can we respond through MyChart? Yes  Agent: Please be advised that Rx refills may take up to 3 business days. We ask that you follow-up with your pharmacy.

## 2023-03-21 NOTE — Telephone Encounter (Unsigned)
 Copied from CRM [id number]. Topic: Clinical - Medication Refill >> Mar 21, 2023 10:32 AM Wyona SQUIBB wrote: Most Recent Primary Care Visit:  Provider: BLUFORD JACQULYN MATSU  Department: [facility]  Visit Type: HOSPITAL FU  Date: 05/10/2022  Medication: XARELTO  20 MG TABS tablet   Has the patient contacted their pharmacy? Yes (Agent: If no, request that the patient contact the pharmacy for the refill. If patient does not wish to contact the pharmacy document the reason why and proceed with request.) (Agent: If yes, when and what did the pharmacy advise?)  Is this the correct pharmacy for this prescription? Yes If no, delete pharmacy and type the correct one.  This is the patient's preferred pharmacy:  [facility] [address] [place] - [address] [place] [address] [place] [address] Phone: [phone] Fax: [phone]   Has the prescription been filled recently? No  Is the patient out of the medication? Yes, pt has 2 days left.   Has the patient been seen for an appointment in the last year OR does the patient have an upcoming appointment? Yes  Can we respond through MyChart? Yes  Agent: Please be advised that Rx refills may take up to 3 business days. We ask that you follow-up with your pharmacy.

## 2023-04-10 DIAGNOSIS — J309 Allergic rhinitis, unspecified: Secondary | ICD-10-CM | POA: Diagnosis not present

## 2023-04-10 DIAGNOSIS — D6869 Other thrombophilia: Secondary | ICD-10-CM | POA: Diagnosis not present

## 2023-04-10 DIAGNOSIS — I4891 Unspecified atrial fibrillation: Secondary | ICD-10-CM | POA: Diagnosis not present

## 2023-04-10 DIAGNOSIS — F419 Anxiety disorder, unspecified: Secondary | ICD-10-CM | POA: Diagnosis not present

## 2023-04-10 DIAGNOSIS — E663 Overweight: Secondary | ICD-10-CM | POA: Diagnosis not present

## 2023-04-10 DIAGNOSIS — Z7901 Long term (current) use of anticoagulants: Secondary | ICD-10-CM | POA: Diagnosis not present

## 2023-04-10 DIAGNOSIS — G309 Alzheimer's disease, unspecified: Secondary | ICD-10-CM | POA: Diagnosis not present

## 2023-04-10 DIAGNOSIS — F02A11 Dementia in other diseases classified elsewhere, mild, with agitation: Secondary | ICD-10-CM | POA: Diagnosis not present

## 2023-04-26 ENCOUNTER — Telehealth: Payer: Medicare HMO | Admitting: Physician Assistant

## 2023-04-26 ENCOUNTER — Ambulatory Visit (INDEPENDENT_AMBULATORY_CARE_PROVIDER_SITE_OTHER): Admitting: Physician Assistant

## 2023-04-26 ENCOUNTER — Encounter: Payer: Self-pay | Admitting: Physician Assistant

## 2023-04-26 VITALS — BP 156/88 | HR 74 | Temp 97.7°F | Ht 70.0 in | Wt 204.0 lb

## 2023-04-26 DIAGNOSIS — G301 Alzheimer's disease with late onset: Secondary | ICD-10-CM | POA: Diagnosis not present

## 2023-04-26 DIAGNOSIS — F0280 Dementia in other diseases classified elsewhere without behavioral disturbance: Secondary | ICD-10-CM

## 2023-04-26 DIAGNOSIS — F02818 Dementia in other diseases classified elsewhere, unspecified severity, with other behavioral disturbance: Secondary | ICD-10-CM | POA: Diagnosis not present

## 2023-04-26 DIAGNOSIS — R300 Dysuria: Secondary | ICD-10-CM

## 2023-04-26 DIAGNOSIS — G309 Alzheimer's disease, unspecified: Secondary | ICD-10-CM

## 2023-04-26 LAB — POCT URINALYSIS DIP (CLINITEK)
Bilirubin, UA: NEGATIVE
Glucose, UA: NEGATIVE mg/dL
Ketones, POC UA: NEGATIVE mg/dL
Leukocytes, UA: NEGATIVE
Nitrite, UA: NEGATIVE
POC PROTEIN,UA: NEGATIVE
Spec Grav, UA: 1.025 (ref 1.010–1.025)
Urobilinogen, UA: 0.2 U/dL
pH, UA: 6 (ref 5.0–8.0)

## 2023-04-26 NOTE — Progress Notes (Signed)
 Acute Office Visit  Subjective:     Patient ID: Erin Wall, female    DOB: [birth year], 78 y.o.   MRN: [id number]   Patient is in today for concerns of urinary tract infection. Patient's caregiver (sister) present today and acting as historian. She reports patient has had strong odor with urine and frequent urination over the last week. Patient denies dysuria, flank pain, or suprapubic pain. Sister denies hematuria, fevers, nausea, or vomiting. Patient was seen by her neurologist earlier today, recommending referral to PT for walker assistance and overall strengthening.    Review of Systems  Constitutional:  Negative for chills, fever and malaise/fatigue.  Gastrointestinal:  Negative for nausea and vomiting.  Genitourinary:  Positive for frequency. Negative for dysuria, flank pain, hematuria and urgency.        Objective:     BP (!) 156/88   Pulse 74   Temp 97.7 F (36.5 C)   Ht 5\' 10"  (1.778 m)   Wt 204 lb (92.5 kg)   SpO2 97%   BMI 29.27 kg/m   Physical Exam Constitutional:      Appearance: Normal appearance.  HENT:     Head: Normocephalic.     Mouth/Throat:     Mouth: Mucous membranes are moist.     Pharynx: Oropharynx is clear.  Eyes:     Extraocular Movements: Extraocular movements intact.     Conjunctiva/sclera: Conjunctivae normal.  Cardiovascular:     Rate and Rhythm: Normal rate and regular rhythm.     Heart sounds: No murmur heard. Pulmonary:     Effort: Pulmonary effort is normal.     Breath sounds: Normal breath sounds. No wheezing.  Abdominal:     General: Abdomen is flat.     Palpations: Abdomen is soft.     Tenderness: There is no abdominal tenderness. There is no right CVA tenderness or left CVA tenderness.  Musculoskeletal:     Right lower leg: No edema.     Left lower leg: No edema.  Skin:    General: Skin is warm and dry.  Neurological:     General: No focal deficit present.     Mental Status: She is alert and oriented to person,  place, and time.  Psychiatric:        Mood and Affect: Mood normal.        Behavior: Behavior normal.     Results for orders placed or performed in visit on 04/26/23  POCT URINALYSIS DIP (CLINITEK)  Result Value Ref Range   Color, UA yellow yellow   Clarity, UA clear clear   Glucose, UA negative negative mg/dL   Bilirubin, UA negative negative   Ketones, POC UA negative negative mg/dL   Spec Grav, UA 9.562 1.308 - 1.025   Blood, UA small (A) negative   pH, UA 6.0 5.0 - 8.0   POC PROTEIN,UA negative negative, trace   Urobilinogen, UA 0.2 0.2 or 1.0 E.U./dL   Nitrite, UA Negative Negative   Leukocytes, UA Negative Negative        Assessment & Plan:  Dysuria -     POCT URINALYSIS DIP (CLINITEK) -     Urine Culture  Alzheimer disease (HCC) -     Ambulatory referral to Physical Therapy   Patient appears stable today.  Physical exam without abnormal findings.  No CVA tenderness or suprapubic tenderness.  Urine dipstick done in office today. Urine sent for culture, will review sensitivities and treat as indicated.  I did  not initiate antibiotic therapy today, will await culture.  Patient should return to office if she experiences nausea, vomiting, fevers, flank pain.   Referral to PT placed for overall strengthening and ambulation assistance per neurology request. Patient should follow up in the near future for a physical and lab work.    Return in about 2 months (around 06/26/2023) for physical/labs.  Toni Amend Mariabelen Pressly, PA-C

## 2023-04-26 NOTE — Progress Notes (Signed)
 Virtual Visit via Video Note The purpose of this virtual visit is to provide medical care in a patient that is unable to be seen in person due to physical or health limitations   Consent was obtained for video visit:  yes  Answered questions that patient had about telehealth interaction:  yes I discussed the limitations, risks, security and privacy concerns of performing an evaluation and management service by telemedicine. I also discussed with the patient that there may be a patient responsible charge related to this service. The patient expressed understanding and agreed to proceed.  Pt location: Home Physician Location: office Name of referring provider:  Tommie Sams, Wall I connected with [name] at patients initiation/request on 04/26/2023 at 11:00 AM EDT by video enabled telemedicine application and verified that I am speaking with the correct person using two identifiers. Pt MRN:  [id number] Pt DOB:  [birth year] Video Participants:  Erin Wall; sister     Assessment and Plan:    Dementia likely due to Alzheimer's disease with behavioral disturbance, late onset  Erin Wall is a 78 y.o. RH female with a history of  HTN, HLD, DVT on Xarelto, OA, and history of Dementia likely due to Alzheimer's Disease with behavioral disturbance as per Neuropsych evaluation 2021 seen today in follow up.  She was last seen in the office on 10/24/2022 at which time MMSE was unable to be performed due to worsening memory.  Cognitive decline is noted.  She is no longer on antidementia medications due to progression of the disease, as medications are no longer therapeutic.  She requires assistance with her ADLs.  Sleep is controlled with mirtazapine  Follow-up in 6 months Recommend good control of her cardiovascular risk factors Continue mirtazapine for sleep Continue to control mood as per PCP Agree with home PT by PCP for strength and balance Continue 24/7 supervision for  safety   History of Present Illness:    Any changes in memory?  "It is worse "-sister says.  She is unable to complete tasks.  She has difficulty retrieving information, also processing it.  STM and LTM are both involved. Repeats oneself? Endorsed Disoriented when walking into a room?  Sometimes she thinks she is at school. Leaving objects in unusual places?  She collects stuff and places them in a bag such as papers, TV remote, socks, shoes, etc.  Ambulates with difficulty?  She has to slow down significantly, steps are smaller. she needs a walker to ambulate, but does not want to use it. Recent falls?  Denies Any head injuries?  Denies Seizures?  Denies Wandering behavior?  Denies, she has several covers to rule out a house for safety. Any mood changes? Agitation ?  Occasionally she has agitation, takes Ativan as needed Any depression?:  Denies Hallucinations?  She thinks she is at work sometimes, surrounded by people, sees a dog, and asks about people that are deceased  Paranoia? Sometimes, does not want to get dressed  Patient reports that sleeps well without vivid dreams, REM behavior or sleepwalking she takes Remeron to help her sleep.   History of sleep apnea?  Denies Independent of bathing and dressing?  She needs to be guided to shower, needs assistance to bathe and to dress. Does the patient needs help with medications? Sister is in charge.  pillbox pill pack Any changes in appetite?Eats well, but eats slowly "takes her one hour to eat". Patient have trouble swallowing? Denies  Any headaches?  Denies  double vision?  Denies Any focal numbness or tingling?  Denies Any pain?  Denies Unilateral weakness?  Denies Any tremors?  Denies Any incontinence of urine?  Endorsed, wears pads, she has recurrent UTIs, she may be having one today  Any bowel dysfunction?  Denies Patient lives with her sister.      History on Initial Assessment 12/04/2018: This is a 78 year old right-handed  woman with a history of diet-controlled hypertension, hyperlipidemia, presenting for evaluation of memory loss. Her sister Erin Wall is present during the visit to provide additional information. The patient reports that her memory is not bad, but not as sharp. She lives alone. She denies getting lost driving. She reports taking medications regularly. She reports missing a bill payment in Feb/March. She forgets names but not very often. She occasionally misplaces things. Her sister provides a different history. Erin Wall reports the patient started having memory changes a year ago. She is more forgetful, worse in the evening hours. She has lost her keys several times. In Feb/March, utilities were cut off, including her electricity, cable, and cell phone, she had bills up to $300-400, family helped her fix this and put everything on draft. She was getting lost driving Erin Wall disclosed this privately). Erin Wall reports she gets very confused when she wakes up from dreams. She states she is having dreams. She has sundowning, getting confused and having difficulty understanding what people are telling her. She knows what she wants to say but gets tangled up getting them out. Over the past 1-2 months, she started having hallucinations, telling her sister that there are children in the house or someone had come or their brother who lives out of town is there. She loses things and says somebody got her shoes and found it in her car. She states there was a bazaar that occurred. She is independent with dressing and bathing, no hygiene concerns. Her father had dementia in his 33s. She denies any history of significant head injuries or alcohol use.   She has urinary frequency. She denies any headaches, dizziness, diplopia, dysarthria, dysphagia, neck/back pain, focal numbness/tingling/weakness, bowel dysfunction. No anosmia, tremors, no falls. Sleep is good. She fell a few months ago on her deck and injured her knee. She states her  mood is great. 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   I personally reviewed head CT without contrast done 09/2018 which did not show any acute changes. It was noted that the ventricles are rather prominent compared to sulci. While this finding may be due to atrophy, a degree of communicating hydrocephalus cannot be excluded on this study.    Prior medications: Donepezil (side effects), rivastigmine (somnolence)   Neuropsychological testing in June 2021 showed prominent areas of weakness surrounding learning and memory, cognitive flexibility, and expressive language. Diagnosis of Major Neurocognitive Disorder (i.e. dementia) likely due to Alzheimer's disease. It was noted that Lewy body dementia could not be ruled out, she does have hallucinations however did not have any other associated symptoms with intact visuoconstructional abilities on testing   Current Outpatient Medications on File Prior to Visit  Medication Sig Dispense Refill   amoxicillin-clavulanate (AUGMENTIN) 875-125 MG tablet Take 1 tablet by mouth 2 (two) times daily. 14 tablet 0   EPINEPHrine 0.3 mg/0.3 mL IJ SOAJ injection Inject 0.3 mLs (0.3 mg total) into the muscle as needed for anaphylaxis. 1 each 0   LORazepam (ATIVAN) 0.5 MG tablet Take 1 tablet (0.5 mg total) by mouth 2 (two) times daily as  needed for anxiety. 60 tablet 3   mirtazapine (REMERON) 30 MG tablet Take 1 tablet (30 mg total) by mouth at bedtime. 90 tablet 3   XARELTO 20 MG TABS tablet TAKE 1 TABLET BY MOUTH ONCE DAILY WITH SUPPER 90 tablet 0   No current facility-administered medications on file prior to visit.     Observations/Objective:   There were no vitals filed for this visit. GEN:  The patient appears stated age and is in NAD.  Neurological examination: Patient is awake, alert, oriented to person, not to place or date. No aphasia or dysarthria. Intact fluency, decreased  comprehension. Remote and recent memory impaired. Unable to  repeat phrases. Cranial nerves: Extraocular movements intact with no nystagmus. No facial asymmetry. Motor: moves all extremities symmetrically, at least anti-gravity x 4. No incoordination on finger to nose testing. Gait: narrow-based and steady, flexes forward, left arm swing, short steps     Follow Up Instructions:    -I discussed the assessment and treatment plan with the patient. The patient was provided an opportunity to ask questions and all were answered. The patient agreed with the plan and demonstrated an understanding of the instructions.   The patient was advised to call back or seek an in-person evaluation if the symptoms worsen or if the condition fails to improve as anticipated.    Total time spent on today's visit was , including both face-to-face time and nonface-to-face time.  Time included that spent on review of records (prior notes available to me/labs/imaging if pertinent), discussing treatment and goals, answering patient's questions and coordinating care.   Marlowe Kays, PA-C

## 2023-04-28 LAB — URINE CULTURE

## 2023-04-28 LAB — SPECIMEN STATUS REPORT

## 2023-04-30 ENCOUNTER — Encounter: Payer: Self-pay | Admitting: Physician Assistant

## 2023-06-12 ENCOUNTER — Other Ambulatory Visit: Payer: Self-pay | Admitting: Family Medicine

## 2023-06-26 ENCOUNTER — Encounter: Admitting: Physician Assistant

## 2023-07-04 ENCOUNTER — Ambulatory Visit (INDEPENDENT_AMBULATORY_CARE_PROVIDER_SITE_OTHER): Admitting: Physician Assistant

## 2023-07-04 ENCOUNTER — Encounter: Payer: Self-pay | Admitting: Physician Assistant

## 2023-07-04 VITALS — BP 130/82 | Ht 70.0 in | Wt 206.4 lb

## 2023-07-04 DIAGNOSIS — Z0000 Encounter for general adult medical examination without abnormal findings: Secondary | ICD-10-CM | POA: Diagnosis not present

## 2023-07-04 DIAGNOSIS — Z13220 Encounter for screening for lipoid disorders: Secondary | ICD-10-CM | POA: Diagnosis not present

## 2023-07-04 DIAGNOSIS — G309 Alzheimer's disease, unspecified: Secondary | ICD-10-CM

## 2023-07-04 DIAGNOSIS — Z0001 Encounter for general adult medical examination with abnormal findings: Secondary | ICD-10-CM | POA: Diagnosis not present

## 2023-07-04 DIAGNOSIS — F0280 Dementia in other diseases classified elsewhere without behavioral disturbance: Secondary | ICD-10-CM

## 2023-07-04 NOTE — Patient Instructions (Signed)

## 2023-07-04 NOTE — Progress Notes (Signed)
 Subjective:   Erin Wall is a 78 y.o. female who presents for Medicare Annual (Subsequent) preventive examination.  Visit Complete: In person  Accompanied by caretaker, Louanna Rouse   Patient Active Problem List   Diagnosis Date Noted   Urinary incontinence 03/20/2022   Low back pain 03/20/2022   Alzheimer disease (HCC) 05/27/2020   History of DVT of lower extremity 05/27/2020   S/P hysterectomy 05/27/2020   Unilateral primary osteoarthritis, right knee 01/17/2019   Hyperlipidemia 05/09/2012   Hypertension 05/09/2012   No hospitalizations or surgeries since last AWV.  Medications reviewed with patient's care taker, endorses daily medication compliance. No refills needed. Denies concerns for adverse effects.      Objective:    Today's Vitals   07/04/23 1312  BP: 130/82  Weight: 206 lb 6.4 oz (93.6 kg)  Height: 5\' 10"  (1.778 m)   Body mass index is 29.62 kg/m.     10/24/2022   11:15 AM 05/04/2022   12:17 AM 04/20/2022   10:56 AM 04/18/2021    1:23 PM 02/16/2020    8:24 AM 09/15/2019    4:20 PM 07/09/2019    3:00 PM  Advanced Directives  Does Patient Have a Medical Advance Directive? Yes Yes Yes Yes No No No  Type of Advance Directive Healthcare Power of [employer] Power of Attorney     Does patient want to make changes to medical advance directive? No - Patient declined        Copy of Healthcare Power of Attorney in Chart? No - copy requested          Current Medications (verified) Outpatient Encounter Medications as of 07/04/2023  Medication Sig   EPINEPHrine  0.3 mg/0.3 mL IJ SOAJ injection Inject 0.3 mLs (0.3 mg total) into the muscle as needed for anaphylaxis.   LORazepam  (ATIVAN ) 0.5 MG tablet Take 1 tablet (0.5 mg total) by mouth 2 (two) times daily as needed for anxiety.   mirtazapine  (REMERON ) 30 MG tablet Take 1 tablet (30 mg total) by mouth at bedtime.   XARELTO  20 MG TABS tablet TAKE 1 TABLET BY MOUTH ONCE DAILY WITH SUPPER   [DISCONTINUED]  amoxicillin -clavulanate (AUGMENTIN ) 875-125 MG tablet Take 1 tablet by mouth 2 (two) times daily.   No facility-administered encounter medications on file as of 07/04/2023.    Allergies (verified) Pravachol   History: Past Medical History:  Diagnosis Date   Arthritis 05/09/2012   Borderline diabetes    DVT (deep venous thrombosis) (HCC)    Generalized anxiety disorder    GERD (gastroesophageal reflux disease)    Hematuria 12/07/2014   Hemorrhoids    Hyperlipemia    Hyperlipidemia 05/09/2012   Hypertension    Impaired fasting glucose 11/15/2012   Lung nodules    Lymphocytosis 05/09/2012   Major neurocognitive disorder due to Alzheimer's disease, possible 08/13/2019   Postmenopausal 12/07/2014   Tendonitis    Unilateral primary osteoarthritis, right knee 01/17/2019   Urinary frequency 12/07/2014   Past Surgical History:  Procedure Laterality Date   ABDOMINAL HYSTERECTOMY     BACTERIAL OVERGROWTH TEST  01/01/2012   Procedure: BACTERIAL OVERGROWTH TEST;  Surgeon: Alyce Jubilee, MD;  Location: [facility];  Service: Endoscopy;  Laterality: N/A;  7:30   BREAST BIOPSY     BREAST BIOPSY Right 01/26/2022   US  RT BREAST BX W LOC DEV 1ST LESION IMG BX SPEC US  GUIDE 01/26/2022 [facility]   BREAST EXCISIONAL BIOPSY Left 1978   benign   BREAST SURGERY  tumor removed   COLONOSCOPY  2004   COLONOSCOPY  09/20/2010   UEA:VWUJWJXB polyps,internal hemorrhoids/polypoid lesion in the cecum   ESOPHAGOGASTRODUODENOSCOPY  09/20/2010   JYN:WGNFAOZHY/QMVH gastritis   Family History  Problem Relation Age of Onset   Hypertension Mother    Diabetes Mother    Hypertension Father    Diabetes Father    Heart attack Father    Stroke Father    Cancer Brother    Colon cancer Neg Hx    Colon polyps Neg Hx    Social History   Socioeconomic History   Marital status: Single    Spouse name: Not on file   Number of children: Not on file   Years of education: 18   Highest education level:  Master's degree (e.g., MA, MS, MEng, MEd, MSW, MBA)  Occupational History   Occupation: retired Runner, broadcasting/film/video   Tobacco Use   Smoking status: Never   Smokeless tobacco: Never  Vaping Use   Vaping status: Never Used  Substance and Sexual Activity   Alcohol use: No   Drug use: No   Sexual activity: Not Currently    Birth control/protection: Surgical, Post-menopausal    Comment: hyst  Other Topics Concern   Not on file  Social History Narrative   Lives one level lives alone   Right handed   Caffeine - not much    Exercise - some   Education - masters   Social Drivers of Health   Financial Resource Strain: Low Risk  (05/27/2020)   Overall Financial Resource Strain (CARDIA)    Difficulty of Paying Living Expenses: Not hard at all  Food Insecurity: No Food Insecurity (05/27/2020)   Hunger Vital Sign    Worried About Running Out of Food in the Last Year: Never true    Ran Out of Food in the Last Year: Never true  Transportation Needs: No Transportation Needs (05/27/2020)   PRAPARE - Administrator, Civil Service (Medical): No    Lack of Transportation (Non-Medical): No  Physical Activity: Inactive (05/27/2020)   Exercise Vital Sign    Days of Exercise per Week: 0 days    Minutes of Exercise per Session: 0 min  Stress: Stress Concern Present (05/27/2020)   [employer] of Occupational Health - Occupational Stress Questionnaire    Feeling of Stress : To some extent  Social Connections: Socially Isolated (05/27/2020)   Social Connection and Isolation Panel [NHANES]    Frequency of Communication with Friends and Family: More than three times a week    Frequency of Social Gatherings with Friends and Family: More than three times a week    Attends Religious Services: Never    Database administrator or Organizations: No    Attends Banker Meetings: Never    Marital Status: Never married    Tobacco Counseling Counseling given: Not Answered   Clinical  Intake:  ADLs- needs assistance with most if not all ADLs. Sister assists daily at home.  Gait/balance/mobility- impaired secondary to Alzheimer's. Can ambulate with alone but needs assistance for safety precautions.    Activities of Daily Living     No data to display          Patient Care Team: Cook, Jayce G, DO as PCP - General (Family Medicine) Alyce Jubilee, MD (Inactive) (Gastroenterology) Jhonny Moss, MD as Consulting Physician (Neurology)  Indicate any recent Medical Services you may have received from other than [facility] providers in the past year (date  may be approximate).     Assessment:   This is a routine wellness examination for Erin Wall.  Hearing/Vision screen No results found.   Goals Addressed   None   Depression Screen    07/04/2023    1:47 PM 04/26/2023    2:15 PM 05/10/2022   11:06 AM 03/20/2022    3:33 PM 01/03/2022    3:05 PM 06/07/2021   10:43 AM 05/27/2020    1:34 PM  PHQ 2/9 Scores  PHQ - 2 Score 2 4 2 2 3  0 2  PHQ- 9 Score 10 15 6 12 11  8     Fall Risk    07/04/2023    1:46 PM 10/24/2022   11:15 AM 05/10/2022   11:06 AM 04/20/2022   10:55 AM 01/03/2022    3:05 PM  Fall Risk   Falls in the past year? 0 0 0 1 1  Number falls in past yr:  0 0 1 1  Injury with Fall?  0 0 0 0  Risk for fall due to : Impaired balance/gait  History of fall(s)    Follow up Falls evaluation completed Falls evaluation completed  Falls evaluation completed       TIMED UP AND GO:  Was the test performed?  No    Cognitive Function:    04/18/2021    2:00 PM  MMSE - Mini Mental State Exam  Orientation to time 1  Orientation to Place 2  Registration 3  Attention/ Calculation 5  Recall 0  Language- name 2 objects 2  Language- repeat 1  Language- follow 3 step command 3  Language- read & follow direction 1  Write a sentence 1  Copy design 0  Total score 19        Immunizations Immunization History  Administered Date(s) Administered   DTaP 12/11/2013    Influenza, High Dose Seasonal PF 12/12/2017, 12/05/2022   Influenza,inj,Quad PF,6+ Mos 10/15/2014, 10/25/2015, 10/24/2016, 11/15/2018, 10/25/2019   Influenza-Unspecified 10/15/2014, 03/20/2017, 12/12/2017   Moderna Covid-19 Fall Seasonal Vaccine 18yrs & older 12/05/2022   Pneumococcal Conjugate-13 03/27/2016    TDAP status: Up to date  Flu Vaccine status: Up to date  Pneumococcal vaccine status: Due, Education has been provided regarding the importance of this vaccine. Advised may receive this vaccine at local pharmacy or Health Dept. Aware to provide a copy of the vaccination record if obtained from local pharmacy or Health Dept. Verbalized acceptance and understanding.  Covid-19 vaccine status: Completed vaccines  Qualifies for Shingles Vaccine? Yes   Zostavax completed No   Shingrix Completed?: No.    Education has been provided regarding the importance of this vaccine. Patient has been advised to call insurance company to determine out of pocket expense if they have not yet received this vaccine. Advised may also receive vaccine at local pharmacy or Health Dept. Verbalized acceptance and understanding.  Screening Tests Health Maintenance  Topic Date Due   Hepatitis C Screening  Never done   Zoster Vaccines- Shingrix (1 of 2) Never done   Pneumonia Vaccine 79+ Years old (2 of 2 - PPSV23) 03/27/2017   COVID-19 Vaccine (2 - 2024-25 season) 06/05/2023   INFLUENZA VACCINE  09/14/2023   DTaP/Tdap/Td (2 - Tdap) 12/12/2023   Medicare Annual Wellness (AWV)  07/03/2024   DEXA SCAN  Completed   HPV VACCINES  Aged Out   Meningococcal B Vaccine  Aged Out   Colonoscopy  Discontinued    Health Maintenance  Health Maintenance Due  Topic Date  Due   Hepatitis C Screening  Never done   Zoster Vaccines- Shingrix (1 of 2) Never done   Pneumonia Vaccine 32+ Years old (2 of 2 - PPSV23) 03/27/2017   COVID-19 Vaccine (2 - 2024-25 season) 06/05/2023    Colorectal cancer screening: No longer  required.   Mammogram status: No longer required due to age.  Bone Density status: Completed  . Results reflect: Bone density results: NORMAL. Repeat every 2 years.  Lung Cancer Screening: (Low Dose CT Chest recommended if Age 6-80 years, 20 pack-year currently smoking OR have quit w/in 15years.) does not qualify.   Lung Cancer Screening Referral: n/a  Additional Screening:  Hepatitis C Screening: does not qualify; Completed n/a  Vision Screening: Recommended annual ophthalmology exams for early detection of glaucoma and other disorders of the eye. Is the patient up to date with their annual eye exam?  Yes  If pt is not established with a provider, would they like to be referred to a provider to establish care? No .   Dental Screening: Recommended annual dental exams for proper oral hygiene  Diabetic Foot Exam: N/A  Community Resource Referral / Chronic Care Management: CRR required this visit?  No   CCM required this visit?  No     Plan:     I have personally reviewed and noted the following in the patient's chart:   Medical and social history Use of alcohol, tobacco or illicit drugs  Current medications and supplements including opioid prescriptions. Patient is not currently taking opioid prescriptions. Functional ability and status Nutritional status Physical activity Advanced directives List of other physicians Hospitalizations, surgeries, and ER visits in previous 12 months Vitals Screenings to include cognitive, depression, and falls Referrals and appointments  In addition, I have reviewed and discussed with patient certain preventive protocols, quality metrics, and best practice recommendations. A written personalized care plan for preventive services as well as general preventive health recommendations were provided to patient.     Jearlean Mince Dillyn Menna, PA-C   07/04/2023   After Visit Summary: (In Person-Printed) AVS printed and given to the patient

## 2023-07-05 ENCOUNTER — Ambulatory Visit: Payer: Self-pay | Admitting: Physician Assistant

## 2023-07-05 LAB — BASIC METABOLIC PANEL WITH GFR
BUN/Creatinine Ratio: 12 (ref 12–28)
BUN: 12 mg/dL (ref 8–27)
CO2: 22 mmol/L (ref 20–29)
Calcium: 9.6 mg/dL (ref 8.7–10.3)
Chloride: 109 mmol/L — ABNORMAL HIGH (ref 96–106)
Creatinine, Ser: 1.02 mg/dL — ABNORMAL HIGH (ref 0.57–1.00)
Glucose: 93 mg/dL (ref 70–99)
Potassium: 4.4 mmol/L (ref 3.5–5.2)
Sodium: 144 mmol/L (ref 134–144)
eGFR: 57 mL/min/{1.73_m2} — ABNORMAL LOW (ref >59)

## 2023-07-05 LAB — HEPATIC FUNCTION PANEL
ALT: 30 IU/L (ref 0–32)
AST: 22 IU/L (ref 0–40)
Albumin: 4 g/dL (ref 3.8–4.8)
Alkaline Phosphatase: 79 IU/L (ref 44–121)
Bilirubin Total: 0.3 mg/dL (ref 0.0–1.2)
Bilirubin, Direct: 0.12 mg/dL (ref 0.00–0.40)
Total Protein: 6.8 g/dL (ref 6.0–8.5)

## 2023-07-05 LAB — LIPID PANEL
Chol/HDL Ratio: 3.8 ratio (ref 0.0–4.4)
Cholesterol, Total: 209 mg/dL — ABNORMAL HIGH (ref 100–199)
HDL: 55 mg/dL (ref >39)
LDL Chol Calc (NIH): 139 mg/dL — ABNORMAL HIGH (ref 0–99)
Triglycerides: 86 mg/dL (ref 0–149)
VLDL Cholesterol Cal: 15 mg/dL (ref 5–40)

## 2023-07-17 DIAGNOSIS — M1711 Unilateral primary osteoarthritis, right knee: Secondary | ICD-10-CM | POA: Diagnosis not present

## 2023-07-17 DIAGNOSIS — E785 Hyperlipidemia, unspecified: Secondary | ICD-10-CM | POA: Diagnosis not present

## 2023-07-17 DIAGNOSIS — I10 Essential (primary) hypertension: Secondary | ICD-10-CM | POA: Diagnosis not present

## 2023-07-17 DIAGNOSIS — G309 Alzheimer's disease, unspecified: Secondary | ICD-10-CM | POA: Diagnosis not present

## 2023-07-17 DIAGNOSIS — Z86718 Personal history of other venous thrombosis and embolism: Secondary | ICD-10-CM | POA: Diagnosis not present

## 2023-07-17 DIAGNOSIS — Z90710 Acquired absence of both cervix and uterus: Secondary | ICD-10-CM | POA: Diagnosis not present

## 2023-07-17 DIAGNOSIS — F0280 Dementia in other diseases classified elsewhere without behavioral disturbance: Secondary | ICD-10-CM | POA: Diagnosis not present

## 2023-07-17 DIAGNOSIS — Z556 Problems related to health literacy: Secondary | ICD-10-CM | POA: Diagnosis not present

## 2023-07-17 DIAGNOSIS — Z7901 Long term (current) use of anticoagulants: Secondary | ICD-10-CM | POA: Diagnosis not present

## 2023-07-17 DIAGNOSIS — F411 Generalized anxiety disorder: Secondary | ICD-10-CM | POA: Diagnosis not present

## 2023-07-17 DIAGNOSIS — M545 Low back pain, unspecified: Secondary | ICD-10-CM | POA: Diagnosis not present

## 2023-07-20 ENCOUNTER — Telehealth: Payer: Self-pay

## 2023-07-20 NOTE — Telephone Encounter (Signed)
 Please advise  Copied from CRM [id number]. Topic: Clinical - Home Health Verbal Orders >> Jul 18, 2023  9:18 AM Rennis Case wrote: Caller/Agency: Arland Bellis - [facility] Callback Number: [phone], can leave message Service Requested: Physical Therapy Frequency: 2x2 1x6 Any new concerns about the patient? Yes, Requesting an order for a travel wheelchair to the home.

## 2023-07-31 ENCOUNTER — Telehealth: Payer: Self-pay | Admitting: *Deleted

## 2023-07-31 NOTE — Telephone Encounter (Unsigned)
 Copied from CRM [id number]. Topic: General - Other >> Jul 31, 2023  3:58 PM Bearl Botts E wrote: Reason for CRM: Carolynne Citron from [facility] called to report that the patient's diastolic bp reading is 94 which is out of range for their parameters.    Best contact: [phone]

## 2023-08-01 ENCOUNTER — Telehealth: Payer: Self-pay | Admitting: Family Medicine

## 2023-08-01 NOTE — Telephone Encounter (Signed)
 Copied from CRM [id number]. Topic: Clinical - Home Health Verbal Orders >> Aug 01, 2023 10:20 AM Barbette Boom wrote: Caller/Agency: Evalyn Hillier from [facility] Callback Number: [phone] Service Requested: Speech Therapy Frequency: 1x a week Any new concerns about the patient? No

## 2023-08-01 NOTE — Telephone Encounter (Signed)
>>   Aug 01, 2023 10:20 AM Donee H wrote: Caller/Agency: Evalyn Hillier from [facility] Callback Number: [phone] Service Requested: Speech Therapy Frequency: 1x a week Any new concerns about the patient? No

## 2023-08-02 NOTE — Telephone Encounter (Signed)
 Cook, Jayce G, DO      07/31/23  9:41 PM Noted. I'm not worried/concerned about this. No intervention needed.

## 2023-08-07 NOTE — Telephone Encounter (Unsigned)
 Copied from CRM [id number]. Topic: Clinical - Home Health Verbal Orders >> Aug 07, 2023  9:32 AM Tiffini S wrote: Caller/Agency: Judy with [facility]  Callback Number: [phone] Service Requested: Speech Therapy Frequency: 1 week two every other week 2 Any new concerns about the patient? No

## 2023-08-07 NOTE — Telephone Encounter (Signed)
 Verbal given to [facility] with [facility]

## 2023-09-15 ENCOUNTER — Other Ambulatory Visit: Payer: Self-pay | Admitting: Family Medicine

## 2023-09-17 ENCOUNTER — Other Ambulatory Visit: Payer: Self-pay

## 2023-09-17 MED ORDER — RIVAROXABAN 20 MG PO TABS: 20.0000 mg | ORAL_TABLET | Freq: Every day | ORAL | 0 refills | Status: DC

## 2023-11-15 ENCOUNTER — Ambulatory Visit: Admitting: Physician Assistant

## 2023-11-15 ENCOUNTER — Other Ambulatory Visit: Payer: Self-pay | Admitting: Family Medicine

## 2023-11-16 ENCOUNTER — Ambulatory Visit: Admitting: Physician Assistant

## 2023-11-16 ENCOUNTER — Encounter: Payer: Self-pay | Admitting: Physician Assistant

## 2023-11-16 VITALS — BP 149/95 | HR 85 | Resp 20 | Ht 70.0 in | Wt 208.0 lb

## 2023-11-16 DIAGNOSIS — F02818 Dementia in other diseases classified elsewhere, unspecified severity, with other behavioral disturbance: Secondary | ICD-10-CM

## 2023-11-16 DIAGNOSIS — G301 Alzheimer's disease with late onset: Secondary | ICD-10-CM

## 2023-11-16 NOTE — Progress Notes (Signed)
 Assessment/Plan:   Dementia likely due to Alzheimer disease  Erin Wall is a very pleasant 78 y.o. RH female with a history ofHTN, HLD, DVT on Xarelto , OA, and history of Dementia likely due to Alzheimer's Disease with behavioral disturbance as per Neuropsych evaluation 2021 seen today in follow up for memory loss. Patient is no longer on antidementia medications as there are no longer therapeutic.  Unfortunately, some progression of the disease is noted, requiring more assistance with her ADLs.  Mood is controlled with mirtazapine  and Ativan  by her PCP.SABRA    Follow up in 6 months. Continue mirtazapine  for sleep, follow with PCP Continue Ativan  as needed, as per PCP Recommend good control of her cardiovascular risk factors Continue 24/7 supervision for safety.      Subjective:    This patient is accompanied in the office by her sister who supplements the history.  Previous records as well as any outside records available were reviewed prior to todays visit. Patient was last seen on 04/26/23. Since 10/2022 MMSE was unable to be performed due to declining memory    Any changes in memory since last visit?  Both STM and LTM are involved. Has more difficulty following instructions, may be worse. repeats oneself?  Endorsed, sometimes she thinks she is working at school.  Disoriented when walking into a room? Denies    Leaving objects?  May misplace things, but also collects stuff such as papers. TV remote, socks, etc and places them in a bag.    Wandering behavior?  denies   Any personality changes since last visit?  Denies. She takes Ativan  if needed for agitation   Any worsening depression?:  Denies.   Hallucinations or paranoia? Sometimes she thinks she is at work, or surrounded by people. She may ask about deceased people thinking they are alive. Seizures? denies    Any sleep changes?  Sleeps well with Remeron . Denies vivid dreams, REM behavior or sleepwalking   Sleep apnea?    Denies.   Any hygiene concerns? Sometimes she does not want to get dressed   Independent of bathing and dressing? Needs assistance to shower and to dress up  Does the patient needs help with medications?  Sister is in charge   Who is in charge of the finances?  Sister is in charge     Any changes in appetite? It takes her a long time to eat, chews slowly, sister cuts them small bites, sometimes it takes her time to swallow the pill  Patient have trouble swallowing? Denies.   Does the patient cook? No Any headaches?   denies   Any vision changes?- Chronic back pain  denies   Ambulates with difficulty?  Drags the feet at home, small steps  Recent falls or head injuries? Denies.     Unilateral weakness, numbness or tingling? denies   Any tremors?  Denies    Any anosmia?  Denies   Any incontinence of urine?  Endorsed, more incontinence wears pads, recurrent UTIs   Any bowel dysfunction?  Sometimes she has bowel incontinence    Patient lives with her sister, HHN is not coming anymore because when they are here they help her, but when they leave is back to the same-sister says.     Does the patient drive? No longer drives     History on Initial Assessment 12/04/2018: This is a 78 year old right-handed woman with a history of diet-controlled hypertension, hyperlipidemia, presenting for evaluation of memory loss. Her sister  Erin Wall is present during the visit to provide additional information. The patient reports that her memory is not bad, but not as sharp. She lives alone. She denies getting lost driving. She reports taking medications regularly. She reports missing a bill payment in Feb/March. She forgets names but not very often. She occasionally misplaces things. Her sister provides a different history. Erin Wall reports the patient started having memory changes a year ago. She is more forgetful, worse in the evening hours. She has lost her keys several times. In Feb/March, utilities were cut off,  including her electricity, cable, and cell phone, she had bills up to $300-400, family helped her fix this and put everything on draft. She was getting lost driving Janese disclosed this privately). Erin Wall reports she gets very confused when she wakes up from dreams. She states she is having dreams. She has sundowning, getting confused and having difficulty understanding what people are telling her. She knows what she wants to say but gets tangled up getting them out. Over the past 1-2 months, she started having hallucinations, telling her sister that there are children in the house or someone had come or their brother who lives out of town is there. She loses things and says somebody got her shoes and found it in her car. She states there was a bazaar that occurred. She is independent with dressing and bathing, no hygiene concerns. Her father had dementia in his 38s. She denies any history of significant head injuries or alcohol use.   She has urinary frequency. She denies any headaches, dizziness, diplopia, dysarthria, dysphagia, neck/back pain, focal numbness/tingling/weakness, bowel dysfunction. No anosmia, tremors, no falls. Sleep is good. She fell a few months ago on her deck and injured her knee. She states her mood is great. 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   I personally reviewed head CT without contrast done 09/2018 which did not show any acute changes. It was noted that the ventricles are rather prominent compared to sulci. While this finding may be due to atrophy, a degree of communicating hydrocephalus cannot be excluded on this study.    Prior medications: Donepezil  (side effects), rivastigmine  (somnolence)   Neuropsychological testing in June 2021 showed prominent areas of weakness surrounding learning and memory, cognitive flexibility, and expressive language. Diagnosis of Major Neurocognitive Disorder (i.e. dementia) likely due to Alzheimer's disease. It was noted  that Lewy body dementia could not be ruled out, she does have hallucinations however did not have any other associated symptoms with intact visuoconstructional abilities on testing   PREVIOUS MEDICATIONS:   CURRENT MEDICATIONS:  Outpatient Encounter Medications as of 11/16/2023  Medication Sig   EPINEPHrine  0.3 mg/0.3 mL IJ SOAJ injection Inject 0.3 mLs (0.3 mg total) into the muscle as needed for anaphylaxis.   LORazepam  (ATIVAN ) 0.5 MG tablet Take 1 tablet (0.5 mg total) by mouth 2 (two) times daily as needed for anxiety.   mirtazapine  (REMERON ) 30 MG tablet Take 1 tablet (30 mg total) by mouth at bedtime.   rivaroxaban  (XARELTO ) 20 MG TABS tablet Take 1 tablet (20 mg total) by mouth daily with supper.   No facility-administered encounter medications on file as of 11/16/2023.       04/18/2021    2:00 PM  MMSE - Mini Mental State Exam  Orientation to time 1  Orientation to Place 2  Registration 3  Attention/ Calculation 5  Recall 0  Language- name 2 objects 2  Language- repeat 1  Language- follow 3 step  command 3  Language- read & follow direction 1  Write a sentence 1  Copy design 0  Total score 19       No data to display          Objective:     PHYSICAL EXAMINATION:    VITALS:   Vitals:   11/16/23 1045 11/16/23 1056  BP: (!) 149/95 (!) 149/95  Pulse: 85   Resp: 20   SpO2: 97%   Weight: 208 lb (94.3 kg)   Height: 5' 10 (1.778 m)     GEN:  The patient appears stated age and is in NAD. HEENT:  Normocephalic, atraumatic.   Neurological examination:  General: NAD, well-groomed, appears stated age. Orientation: The patient is alert. Not oriented to person, place and date Cranial nerves: There is good facial symmetry.The speech is fluent and clear. No aphasia or dysarthria. Fund of knowledge is reduced. Recent and remote memory are impaired. Attention and concentration are reduced. Able to name objects and repeat phrases.  Hearing is intact to conversational  tone.   Sensation: Sensation is intact to light touch throughout Motor: Strength is at least antigravity x4. DTR's 2/4 in UE/LE     Movement examination: Tone: There is normal tone in the UE/LE Abnormal movements: Minimal hand tremor.  No myoclonus.  No asterixis.   Coordination:  There is determination with RAM's.  Abnormal finger to nose on the right. Gait and Station: The patient has no  difficulty arising out of a deep-seated chair without the use of the hands. The patient's stride length is short, flexes forward, L arm swing. Gait is cautious and mildly broad-based   Thank you for allowing us  the opportunity to participate in the care of this nice patient. Please do not hesitate to contact us  for any questions or concerns.   Total time spent on today's visit was 32 minutes dedicated to this patient today, preparing to see patient, examining the patient, ordering tests and/or medications and counseling the patient, documenting clinical information in the EHR or other health record, independently interpreting results and communicating results to the patient/family, discussing treatment and goals, answering patient's questions and coordinating care.  Cc:  Cook, Jayce G, DO  [facility] 11/16/2023 12:24 PM

## 2023-12-03 ENCOUNTER — Other Ambulatory Visit: Payer: Self-pay | Admitting: Family Medicine

## 2023-12-04 ENCOUNTER — Ambulatory Visit: Payer: Self-pay

## 2023-12-04 ENCOUNTER — Encounter: Payer: Self-pay | Admitting: Emergency Medicine

## 2023-12-04 ENCOUNTER — Ambulatory Visit
Admission: EM | Admit: 2023-12-04 | Discharge: 2023-12-04 | Disposition: A | Discharge status: Home / Self Care | Attending: Nurse Practitioner | Admitting: Nurse Practitioner

## 2023-12-04 DIAGNOSIS — N3001 Acute cystitis with hematuria: Secondary | ICD-10-CM | POA: Insufficient documentation

## 2023-12-04 LAB — POCT URINE DIPSTICK
Bilirubin, UA: NEGATIVE
Glucose, UA: NEGATIVE mg/dL
Ketones, POC UA: NEGATIVE mg/dL
Nitrite, UA: POSITIVE — AB
Protein Ur, POC: 30 mg/dL — AB
Spec Grav, UA: >=1.03 — AB (ref 1.010–1.025)
Urobilinogen, UA: 1 U/dL
pH, UA: 5.5 (ref 5.0–8.0)

## 2023-12-04 MED ORDER — CEFUROXIME AXETIL 500 MG PO TABS: 500.0000 mg | ORAL_TABLET | Freq: Two times a day (BID) | ORAL | 0 refills | Status: AC

## 2023-12-04 NOTE — Telephone Encounter (Signed)
 Called CAL, no answer.

## 2023-12-04 NOTE — Telephone Encounter (Signed)
 FYI Only or Action Required?: Action required by provider: request for appointment and clinical question for provider.  Patient was last seen in primary care on 07/04/2023 by Grooms, [place], [place].  Called Nurse Triage reporting Urinary Frequency.  Symptoms began today.  Interventions attempted: Nothing.  Symptoms are: unchanged.  Triage Disposition: See Physician Within 24 Hours  Patient/caregiver understands and will follow disposition?: No, wishes to speak with PCP   Received call back from pt's sister, requesting in person appt today. Offered appt at alternative clinic, pt's sister declined.  No available appts in clinic. Requests work in visit and call back.

## 2023-12-04 NOTE — Discharge Instructions (Signed)
 A urine culture is pending.  You will be contacted when the results of the urine culture are received.  You will also have access to the results via MyChart. Recommend over-the-counter Tylenol  as needed for pain, fever, or general discomfort. Develop a toileting schedule that will allow Erin Wall to urinate at least every 2 hours. Avoid caffeine such as tea, soda, or coffee while symptoms persist. Increase her fluid intake.  She should drink at least 5-7 8 ounce glasses of water daily if possible. Go to the emergency department if she develops mood changes to include increased agitation, confusion, fever, or chills. Follow-up as needed.

## 2023-12-04 NOTE — Telephone Encounter (Signed)
 FYI Only or Action Required?: Action required by provider: clinical question for provider.-   Patient was last seen in primary care on 07/04/2023 by Grooms, [place], PA-C.  Called Nurse Triage reporting Urinary Frequency.  Symptoms began a week ago.  Interventions attempted: Nothing.  Symptoms are: gradually worsening.  Triage Disposition: See Physician Within 24 Hours  Patient/caregiver understands and will follow disposition?: No, wishes to speak with PCP  Sister states due to increased agitation and confusion she is not sure she will be able to get her to the office for an appt. She is asking if she can come pick up a cup so she can get a urine specimen from her at home.      Copied from CRM [id number]. Topic: Clinical - Red Word Triage >> Dec 04, 2023 10:16 AM Winona R wrote: Strong urine smell, frequent urination, increase in confusions. Pt has Alzheimer disease. Her sister is supporting her Reason for Disposition  Urinating more frequently than usual (i.e., frequency) OR new-onset of the feeling of an urgent need to urinate (i.e., urgency)  Answer Assessment - Initial Assessment Questions Pts sister is caregiver and has noticed the changes. She states she also having to wash her more than usual which isn't normal. She states the odor is very strong.     1. SYMPTOM: What's the main symptom you're concerned about? (e.g., frequency, incontinence)     Increased frequency, increased confusion (more than usual), sleeping was off, strong odor 2. ONSET: When did the  symptoms  start?     Last week  3. PAIN: Is there any pain? If Yes, ask: How bad is it? (Scale: 1-10; mild, moderate, severe)     Agitation and confusion indicates pain 4. CAUSE: What do you think is causing the symptoms?    Sister thinks uti 5. OTHER SYMPTOMS: Do you have any other symptoms? (e.g., blood in urine, fever, flank pain, pain with urination)     No fever  Protocols used: Urinary  Symptoms-A-AH

## 2023-12-04 NOTE — ED Triage Notes (Signed)
 Odor to urine and pink color to urine since Sunday

## 2023-12-04 NOTE — ED Provider Notes (Signed)
 [facility]    CSN: [id number] Arrival date & time: 12/04/23  1532      History   Chief Complaint No chief complaint on file.   HPI Erin Wall is a 78 y.o. female.   The history is provided by a relative.   Patient brought in by her family for a 2-day history of malodorous urine and blood-tinged urine.  Patient's family denies fever, chills, urinary frequency, urgency, hesitancy, flank pain, low back pain, or decreased urine stream.  Patient's family reports it has been a while since the patient has had a UTI.  They deny prior history of kidney stones or kidney infection.  Per review of patient's history, patient with history of Alzheimer's disease.  Past Medical History:  Diagnosis Date   Arthritis 05/09/2012   Borderline diabetes    DVT (deep venous thrombosis) (HCC)    Generalized anxiety disorder    GERD (gastroesophageal reflux disease)    Hematuria 12/07/2014   Hemorrhoids    Hyperlipemia    Hyperlipidemia 05/09/2012   Hypertension    Impaired fasting glucose 11/15/2012   Lung nodules    Lymphocytosis 05/09/2012   Major neurocognitive disorder due to Alzheimer's disease, possible 08/13/2019   Postmenopausal 12/07/2014   Tendonitis    Unilateral primary osteoarthritis, right knee 01/17/2019   Urinary frequency 12/07/2014    Patient Active Problem List   Diagnosis Date Noted   Urinary incontinence 03/20/2022   Low back pain 03/20/2022   Alzheimer disease (HCC) 05/27/2020   History of DVT of lower extremity 05/27/2020   S/P hysterectomy 05/27/2020   Unilateral primary osteoarthritis, right knee 01/17/2019   Hyperlipidemia 05/09/2012   Hypertension 05/09/2012    Past Surgical History:  Procedure Laterality Date   ABDOMINAL HYSTERECTOMY     BACTERIAL OVERGROWTH TEST  01/01/2012   Procedure: BACTERIAL OVERGROWTH TEST;  Surgeon: Margo LITTIE Haddock, MD;  Location: [facility];  Service: Endoscopy;  Laterality: N/A;  7:30   BREAST BIOPSY     BREAST  BIOPSY Right 01/26/2022   US  RT BREAST BX W LOC DEV 1ST LESION IMG BX SPEC US  GUIDE 01/26/2022 [facility]   BREAST EXCISIONAL BIOPSY Left 1978   benign   BREAST SURGERY     tumor removed   COLONOSCOPY  2004   COLONOSCOPY  09/20/2010   DOQ:floupeoz polyps,internal hemorrhoids/polypoid lesion in the cecum   ESOPHAGOGASTRODUODENOSCOPY  09/20/2010   DOQ:dumprulmz/fpoi gastritis    OB History     Gravida  0   Para  0   Term  0   Preterm  0   AB  0   Living  0      SAB  0   IAB  0   Ectopic  0   Multiple  0   Live Births               Home Medications    Prior to Admission medications   Medication Sig Start Date End Date Taking? Authorizing Provider  cefUROXime (CEFTIN) 500 MG tablet Take 1 tablet (500 mg total) by mouth 2 (two) times daily with a meal for 7 days. 12/04/23 12/11/23 Yes Leath-Warren, Etta PARAS, NP  EPINEPHrine  0.3 mg/0.3 mL IJ SOAJ injection Inject 0.3 mLs (0.3 mg total) into the muscle as needed for anaphylaxis. 09/15/19   Schuyler Charlie RAMAN, MD  LORazepam  (ATIVAN ) 0.5 MG tablet Take 1 tablet by mouth twice daily as needed for anxiety 11/18/23   Cook, Jayce G, DO  mirtazapine  (REMERON )  30 MG tablet TAKE 1 TABLET BY MOUTH AT BEDTIME 11/18/23   Cook, Jayce G, DO  rivaroxaban  (XARELTO ) 20 MG TABS tablet Take 1 tablet (20 mg total) by mouth daily with supper. 09/17/23   Bluford Jacqulyn MATSU, DO    Family History Family History  Problem Relation Age of Onset   Hypertension Mother    Diabetes Mother    Hypertension Father    Diabetes Father    Heart attack Father    Stroke Father    Cancer Brother    Colon cancer Neg Hx    Colon polyps Neg Hx     Social History Social History   Tobacco Use   Smoking status: Never   Smokeless tobacco: Never  Vaping Use   Vaping status: Never Used  Substance Use Topics   Alcohol use: No   Drug use: No     Allergies   Pravachol and Shellfish allergy   Review of Systems Review of Systems Per  HPI  Physical Exam Triage Vital Signs ED Triage Vitals  Encounter Vitals Group     BP 12/04/23 1607 (!) 147/82     Girls Systolic BP Percentile --      Girls Diastolic BP Percentile --      Boys Systolic BP Percentile --      Boys Diastolic BP Percentile --      Pulse Rate 12/04/23 1607 99     Resp 12/04/23 1607 18     Temp 12/04/23 1607 97.6 F (36.4 C)     Temp Source 12/04/23 1607 Temporal     SpO2 12/04/23 1607 93 %     Weight --      Height --      Head Circumference --      Peak Flow --      Pain Score 12/04/23 1608 0     Pain Loc --      Pain Education --      Exclude from Growth Chart --    No data found.  Updated Vital Signs BP (!) 147/82 (BP Location: Right Arm)   Pulse 99   Temp 97.6 F (36.4 C) (Temporal)   Resp 18   SpO2 93%   Visual Acuity Right Eye Distance:   Left Eye Distance:   Bilateral Distance:    Right Eye Near:   Left Eye Near:    Bilateral Near:     Physical Exam Vitals and nursing note reviewed.  Constitutional:      General: She is not in acute distress.    Appearance: Normal appearance.  HENT:     Head: Normocephalic.  Eyes:     Extraocular Movements: Extraocular movements intact.     Pupils: Pupils are equal, round, and reactive to light.  Cardiovascular:     Rate and Rhythm: Normal rate and regular rhythm.     Pulses: Normal pulses.     Heart sounds: Normal heart sounds.  Pulmonary:     Effort: Pulmonary effort is normal. No respiratory distress.     Breath sounds: Normal breath sounds. No stridor. No wheezing, rhonchi or rales.  Abdominal:     General: Bowel sounds are normal.     Palpations: Abdomen is soft.     Tenderness: There is no abdominal tenderness. There is no right CVA tenderness or left CVA tenderness.  Musculoskeletal:     Cervical back: Normal range of motion.  Skin:    General: Skin is warm and dry.  Neurological:  General: No focal deficit present.     Mental Status: She is alert. She is  disoriented.  Psychiatric:        Mood and Affect: Mood normal.        Behavior: Behavior normal.      UC Treatments / Results  Labs (all labs ordered are listed, but only abnormal results are displayed) Labs Reviewed  POCT URINE DIPSTICK - Abnormal; Notable for the following components:      Result Value   Clarity, UA cloudy (*)    Spec Grav, UA >=1.030 (*)    Blood, UA large (*)    Protein Ur, POC =30 (*)    Nitrite, UA Positive (*)    Leukocytes, UA Small (1+) (*)    All other components within normal limits    EKG   Radiology No results found.  Procedures Procedures (including critical care time)  Medications Ordered in UC Medications - No data to display  Initial Impression / Assessment and Plan / UC Course  I have reviewed the triage vital signs and the nursing notes.  Pertinent labs & imaging results that were available during my care of the patient were reviewed by me and considered in my medical decision making (see chart for details).  Urinalysis is positive for leukocytes, nitrites, protein, and blood, consistent with acute cystitis.  Urine culture is pending.  In the interim, will treat with cefuroxime 500 mg twice daily for the next 7 days.  Supportive care recommendations were provided and discussed with patient's family to include over-the-counter Tylenol , increasing the patient's fluid intake, developing a toileting schedule, and avoiding caffeine.  Discussed strict ER follow-up precautions with the patient's family.  Family was also given indications to follow-up with the patient's PCP.  Patient's family was in agreement with this plan of care and verbalizes understanding.  All questions were answered.  Patient stable for discharge.  Final Clinical Impressions(s) / UC Diagnoses   Final diagnoses:  Acute cystitis with hematuria     Discharge Instructions      A urine culture is pending.  You will be contacted when the results of the urine culture  are received.  You will also have access to the results via MyChart. Recommend over-the-counter Tylenol  as needed for pain, fever, or general discomfort. Develop a toileting schedule that will allow Ms. Curci to urinate at least every 2 hours. Avoid caffeine such as tea, soda, or coffee while symptoms persist. Increase her fluid intake.  She should drink at least 5-7 8 ounce glasses of water daily if possible. Go to the emergency department if she develops mood changes to include increased agitation, confusion, fever, or chills. Follow-up as needed.      ED Prescriptions     Medication Sig Dispense Auth. Provider   cefUROXime (CEFTIN) 500 MG tablet Take 1 tablet (500 mg total) by mouth 2 (two) times daily with a meal for 7 days. 14 tablet Leath-Warren, Etta PARAS, NP      PDMP not reviewed this encounter.   Gilmer Etta PARAS, NP 12/04/23 1624

## 2023-12-06 ENCOUNTER — Ambulatory Visit (HOSPITAL_COMMUNITY): Payer: Self-pay

## 2023-12-06 LAB — URINE CULTURE: Culture: >=100000 — AB

## 2023-12-06 NOTE — Telephone Encounter (Signed)
 Cook, Jayce G, DO      12/04/23  8:16 PM Sorry for the delay. I'm trying my best. Got to this too late. Patient was seen at Urgent Care.

## 2023-12-10 ENCOUNTER — Other Ambulatory Visit: Payer: Self-pay | Admitting: Family Medicine

## 2023-12-14 ENCOUNTER — Ambulatory Visit: Payer: Self-pay

## 2023-12-14 NOTE — Telephone Encounter (Signed)
 FYI Only or Action Required?: FYI only for provider: appointment scheduled on 12/17/23.  Patient was last seen in primary care on 07/04/2023 by Grooms, [place], [place].  Called Nurse Triage reporting Back Pain.  Symptoms began several days ago.  Interventions attempted: Nothing.  Symptoms are: gradually worsening.  Triage Disposition: See PCP When Office is Open (Within 3 Days)  Patient/caregiver understands and will follow disposition?: Yes  Copied from CRM #[id number]. Topic: Clinical - Red Word Triage >> Dec 14, 2023  1:43 PM Kevelyn M wrote: Red Word that prompted transfer to Nurse Triage: Right lower back side pain- started this week. She just finished cefUROXime (CEFTIN) 500 MG tablet. Reason for Disposition  [1] MODERATE back pain (e.g., interferes with normal activities) AND [2] present > 3 days  Answer Assessment - Initial Assessment Questions Went to UC on 10/21 for Uti symptoms. Prescribed Ceftin, just completed course with resolution of UTI symptoms. Now reports moderate right low back pain. Appt scheduled for Monday at home office with another provider d/t no pcp availability within next 3 days.   1. ONSET: When did the pain begin? (e.g., minutes, hours, days)     Past few days  2. LOCATION: Where does it hurt? (upper, mid or lower back)     Lower right back  3. SEVERITY: How bad is the pain?  (e.g., Scale 1-10; mild, moderate, or severe)     moderate  4. PATTERN: Is the pain constant? (e.g., yes, no; constant, intermittent)      Constant, worse when turning a certain way  5. RADIATION: Does the pain shoot into your legs or somewhere else?     No  6. CAUSE:  What do you think is causing the back pain?      Unsure  7. BACK OVERUSE:  Any recent lifting of heavy objects, strenuous work or exercise?     No  8. MEDICINES: What have you taken so far for the pain? (e.g., nothing, acetaminophen , NSAIDS)     None  9. NEUROLOGIC SYMPTOMS: Do you have any  weakness, numbness, or problems with bowel/bladder control?     No  10. OTHER SYMPTOMS: Do you have any other symptoms? (e.g., fever, abdomen pain, burning with urination, blood in urine)       Denies  Protocols used: Back Pain-A-AH

## 2023-12-17 ENCOUNTER — Encounter: Payer: Self-pay | Admitting: Physician Assistant

## 2023-12-17 ENCOUNTER — Ambulatory Visit (INDEPENDENT_AMBULATORY_CARE_PROVIDER_SITE_OTHER): Payer: Self-pay | Admitting: Physician Assistant

## 2023-12-17 VITALS — BP 138/91 | HR 106 | Temp 97.5°F | Ht 70.0 in | Wt 212.0 lb

## 2023-12-17 DIAGNOSIS — F0280 Dementia in other diseases classified elsewhere without behavioral disturbance: Secondary | ICD-10-CM

## 2023-12-17 DIAGNOSIS — G309 Alzheimer's disease, unspecified: Secondary | ICD-10-CM

## 2023-12-17 DIAGNOSIS — R10A1 Flank pain, right side: Secondary | ICD-10-CM | POA: Diagnosis not present

## 2023-12-17 NOTE — Assessment & Plan Note (Addendum)
 Recent UTI treated with antibiotics. Symptoms suggest possible recurrent or unresolved infection. Patient appears well, mild CVA tenderness bilaterally on exam. Patient does not appear in acute distress.  - Urinalysis and culture today. - Consider antibiotics based on culture results. - Recommend acetaminophen  for pain. Advise ice and heat for flank pain. - Follow up if symptoms do not improve in 1-2 weeks or worsen.

## 2023-12-17 NOTE — Progress Notes (Signed)
 Acute Office Visit  Subjective:     Patient ID: Erin Wall, female    DOB: Aug 02, 1945, 78 y.o.   MRN: [id number]   Discussed the use of AI scribe software for clinical note transcription with the patient, who gave verbal consent to proceed.  History of Present Illness Erin Wall is a 78 year old female who presents with right-sided back pain following a recent urinary tract infection.  She experiences right-sided back pain that began last week after a urinary tract infection. Initially, she had dark urine with pink discoloration, which improved with antibiotics but has recently darkened again. The back pain is more pronounced with movement, especially in the morning, and she is not taking any medication for it.  Her caregiver notes difficulty in settling down for sleep, with one night spent awake due to discomfort. The caregiver manages her medication schedule. She has increased difficulty with walking, and her caregiver has contacted physical therapy to resume sessions at home. No current pain or discomfort is reported.    Review of Systems  Unable to perform ROS: Mental acuity  Constitutional:  Negative for chills and fever.  Genitourinary:  Positive for flank pain. Negative for dysuria, frequency, hematuria and urgency.        Objective:     BP (!) 138/91   Pulse (!) 106   Temp (!) 97.5 F (36.4 C)   Ht 5' 10 (1.778 m)   Wt 212 lb (96.2 kg)   SpO2 98%   BMI 30.42 kg/m   Physical Exam Constitutional:      General: She is not in acute distress.    Appearance: Normal appearance. She is not ill-appearing.  HENT:     Head: Normocephalic and atraumatic.     Mouth/Throat:     Mouth: Mucous membranes are moist.     Pharynx: Oropharynx is clear.  Eyes:     Extraocular Movements: Extraocular movements intact.     Conjunctiva/sclera: Conjunctivae normal.  Cardiovascular:     Rate and Rhythm: Normal rate and regular rhythm.     Heart sounds: Normal heart  sounds. No murmur heard. Pulmonary:     Effort: Pulmonary effort is normal.     Breath sounds: Normal breath sounds.  Abdominal:     General: Abdomen is flat. Bowel sounds are normal.     Palpations: Abdomen is soft.     Tenderness: There is no abdominal tenderness. There is right CVA tenderness and left CVA tenderness.  Skin:    General: Skin is warm and dry.  Neurological:     General: No focal deficit present.     Mental Status: She is alert. Mental status is at baseline.     No results found for any visits on 12/17/23.      Assessment & Plan:  Right flank pain Assessment & Plan: Recent UTI treated with antibiotics. Symptoms suggest possible recurrent or unresolved infection. Patient appears well, mild CVA tenderness bilaterally on exam. Patient does not appear in acute distress.  - Urinalysis and culture today. - Consider antibiotics based on culture results. - Recommend acetaminophen  for pain. Advise ice and heat for flank pain. - Follow up if symptoms do not improve in 1-2 weeks or worsen.   Orders: -     Urinalysis -     Urine Culture  Alzheimer disease [facility]) -     Ambulatory referral to Home Health    Return if symptoms worsen or fail to improve.  Charmaine Kaileena Obi,  PA-C

## 2023-12-18 ENCOUNTER — Other Ambulatory Visit: Payer: Self-pay | Admitting: Family Medicine

## 2023-12-18 DIAGNOSIS — R32 Unspecified urinary incontinence: Secondary | ICD-10-CM

## 2023-12-19 ENCOUNTER — Ambulatory Visit: Payer: Self-pay | Admitting: Family Medicine

## 2023-12-19 ENCOUNTER — Ambulatory Visit: Payer: Self-pay | Admitting: *Deleted

## 2023-12-19 DIAGNOSIS — N390 Urinary tract infection, site not specified: Secondary | ICD-10-CM | POA: Diagnosis not present

## 2023-12-19 DIAGNOSIS — R319 Hematuria, unspecified: Secondary | ICD-10-CM | POA: Diagnosis not present

## 2023-12-19 DIAGNOSIS — N3289 Other specified disorders of bladder: Secondary | ICD-10-CM | POA: Diagnosis not present

## 2023-12-19 LAB — MICROSCOPIC EXAMINATION
Casts: NONE SEEN /LPF
Epithelial Cells (non renal): >10 /HPF — AB (ref 0–10)
WBC, UA: NONE SEEN /HPF (ref 0–5)

## 2023-12-19 LAB — UA/M W/RFLX CULTURE, ROUTINE
Bilirubin, UA: NEGATIVE
Glucose, UA: NEGATIVE
Ketones, UA: NEGATIVE
Leukocytes,UA: NEGATIVE
Nitrite, UA: NEGATIVE
Protein,UA: NEGATIVE
RBC, UA: NEGATIVE
Specific Gravity, UA: 1.019 (ref 1.005–1.030)
Urobilinogen, Ur: 0.2 mg/dL (ref 0.2–1.0)
pH, UA: 6.5 (ref 5.0–7.5)

## 2023-12-19 NOTE — Telephone Encounter (Signed)
 Patient's sister called back. States she did not take the pt to the ED yet because she was awaiting a call back. This RN contacted [facility] via [facility] line ([facility] currently practicing at that location).   Pt's sister disconnected, clinical staff at [facility] stated they would call her back.

## 2023-12-19 NOTE — Telephone Encounter (Signed)
 Called pts sister, shelia. Stated they are at the ER in [place]. Pt said she spoke with Dr Bluford today and he told her go to ER. Symptoms have been going on for two weeks.

## 2023-12-19 NOTE — Telephone Encounter (Signed)
 FYI Only or Action Required?: Action required by provider: update on patient condition and recommended ED patient sister requesting call back .  Patient was last seen in primary care on 12/17/2023 by [place], [place], [place].  Called Nurse Triage reporting Hematuria.  Symptoms began today.  Interventions attempted: Nothing.  Symptoms are: gradually worsening.  Triage Disposition: Go to ED Now (or PCP Triage)  Patient/caregiver understands and will follow disposition?: No, wishes to speak with PCP   Patient sister requesting call back. Sister reports she will take patient to ED but would still like call from PCP.          Copied from CRM [id number]. Topic: Clinical - Red Word Triage >> Dec 19, 2023 12:38 PM Rosaria BRAVO wrote: Red Word that prompted transfer to Nurse Triage: Blood in urine, complaining of right side pain. Worsening, was seen for this recently at urgent care.   Sister [name] Single is calling Reason for Disposition  Patient sounds very sick or weak to the triager  Answer Assessment - Initial Assessment Questions Recommended ED due to worsening sx and patient taking xarelto  . Patient sister requesting call back. Patient has dementia and unable to read my chart messages. Reviewed message from 12/19/23  from DOROTHA Ahle, DO, no evidence of UTI.  Please advise.         1. COLOR of URINE: Describe the color of the urine.  (e.g., tea-colored, pink, red, bloody) Do you have blood clots in your urine? (e.g., none, pea, grape, small coin)     Bright red blood . Did not see any blood clots in commode per patient sister. 2. ONSET: When did the bleeding start?      Today  3. EPISODES: How many times has there been blood in the urine? or How many times today?     1 time today but has noted in the past and last UC visit 12/04/23 4. PAIN with URINATION: Is there any pain with passing your urine? If Yes, ask: How bad is the pain?  (Scale 1-10; or mild, moderate,  severe)     Na  5. FEVER: Do you have a fever? If Yes, ask: What is your temperature, how was it measured, and when did it start?     no 6. ASSOCIATED SYMPTOMS: Are you passing urine more frequently than usual?     Yes  7. OTHER SYMPTOMS: Do you have any other symptoms? (e.g., back/flank pain, abdomen pain, vomiting)     Right side pain , blood in urine and in commode and in depends  8. PREGNANCY: Is there any chance you are pregnant? When was your last menstrual period?     na  Protocols used: Urine - Blood In-A-AH

## 2023-12-25 ENCOUNTER — Other Ambulatory Visit: Payer: Self-pay | Admitting: Family Medicine

## 2024-01-04 ENCOUNTER — Ambulatory Visit: Admitting: Physician Assistant

## 2024-01-15 ENCOUNTER — Encounter: Payer: Self-pay | Admitting: Family Medicine

## 2024-01-15 ENCOUNTER — Telehealth: Payer: Self-pay | Admitting: Pharmacy Technician

## 2024-01-15 ENCOUNTER — Ambulatory Visit: Admitting: Family Medicine

## 2024-01-15 ENCOUNTER — Other Ambulatory Visit: Payer: Self-pay | Admitting: Family Medicine

## 2024-01-15 ENCOUNTER — Other Ambulatory Visit (HOSPITAL_COMMUNITY): Payer: Self-pay

## 2024-01-15 VITALS — BP 132/81 | HR 90

## 2024-01-15 DIAGNOSIS — R269 Unspecified abnormalities of gait and mobility: Secondary | ICD-10-CM

## 2024-01-15 DIAGNOSIS — F0280 Dementia in other diseases classified elsewhere without behavioral disturbance: Secondary | ICD-10-CM

## 2024-01-15 DIAGNOSIS — N390 Urinary tract infection, site not specified: Secondary | ICD-10-CM | POA: Insufficient documentation

## 2024-01-15 DIAGNOSIS — I10 Essential (primary) hypertension: Secondary | ICD-10-CM

## 2024-01-15 MED ORDER — BREXPIPRAZOLE 2 MG PO TABS: 2.0000 mg | ORAL_TABLET | Freq: Every day | ORAL | 3 refills | Status: DC

## 2024-01-15 MED ORDER — CEPHALEXIN 250 MG PO CAPS: 250.0000 mg | ORAL_CAPSULE | Freq: Every day | ORAL | 1 refills | Status: AC

## 2024-01-15 MED ORDER — BREXPIPRAZOLE 1 MG PO TABS: ORAL_TABLET | ORAL | 0 refills | Status: DC

## 2024-01-15 MED ORDER — CEPHALEXIN 500 MG PO CAPS: 500.0000 mg | ORAL_CAPSULE | Freq: Two times a day (BID) | ORAL | 0 refills | Status: DC

## 2024-01-15 NOTE — Assessment & Plan Note (Signed)
 Having significant agitation.  Trial of Rexulti.

## 2024-01-15 NOTE — Assessment & Plan Note (Signed)
 Treating with Keflex . After completion of course, starting Keflex  daily for prophylaxis.

## 2024-01-15 NOTE — Progress Notes (Signed)
 Subjective:  Patient ID: Erin Wall, female    DOB: [birth year]  Age: 78 y.o. MRN: [id number]  CC:   Chief Complaint  Patient presents with   Urinary Tract Infection    Possible uti, had abdominal pain over the weekend, has noticed blood, urine strong odor    HPI:  78 year old female presents for evaluation of the above.  Patient accompanied by her caregiver today.  She has had ongoing issues with UTIs since October.  Most recently seen in the ER at [facility] on 11/5.  Culture grew E. coli.  She was treated with a cephalosporin.  Caregiver states that she has had returned of symptoms.  She is having foul-smelling urine and hematuria.  No significant abdominal pain.  No fever.  Caregiver would like physical therapy as she is having issues with gait and strength.  She would like a referral for home health PT via [facility].  Given her difficulty with gait as well as her cognitive deficits, caregiver uses a wheelchair.  She is requesting a prescription for this as well.  Additionally, agitation has been becoming worse.  She currently uses Ativan  as needed.  Will discuss other treatment options today.  Patient Active Problem List   Diagnosis Date Noted   Recurrent UTI 01/15/2024   Urinary incontinence 03/20/2022   Low back pain 03/20/2022   Alzheimer disease (HCC) 05/27/2020   History of DVT of lower extremity 05/27/2020   S/P hysterectomy 05/27/2020   Unilateral primary osteoarthritis, right knee 01/17/2019   Hyperlipidemia 05/09/2012   Hypertension 05/09/2012    Social Hx   Social History   Socioeconomic History   Marital status: Single    Spouse name: Not on file   Number of children: Not on file   Years of education: 18   Highest education level: Master's degree (e.g., MA, MS, MEng, MEd, MSW, MBA)  Occupational History   Occupation: retired runner, broadcasting/film/video   Tobacco Use   Smoking status: Never   Smokeless tobacco: Never  Vaping Use   Vaping status: Never Used   Substance and Sexual Activity   Alcohol use: No   Drug use: No   Sexual activity: Not Currently    Birth control/protection: Surgical, Post-menopausal    Comment: hyst  Other Topics Concern   Not on file  Social History Narrative   Lives one level lives alone   Right handed   Caffeine - not much    Exercise - some   Education - masters   Social Drivers of Health   Financial Resource Strain: Low Risk  (05/27/2020)   Overall Financial Resource Strain (CARDIA)    Difficulty of Paying Living Expenses: Not hard at all  Food Insecurity: No Food Insecurity (05/27/2020)   Hunger Vital Sign    Worried About Running Out of Food in the Last Year: Never true    Ran Out of Food in the Last Year: Never true  Transportation Needs: No Transportation Needs (05/27/2020)   PRAPARE - Administrator, Civil Service (Medical): No    Lack of Transportation (Non-Medical): No  Physical Activity: Inactive (05/27/2020)   Exercise Vital Sign    Days of Exercise per Week: 0 days    Minutes of Exercise per Session: 0 min  Stress: Stress Concern Present (05/27/2020)   [employer] of Occupational Health - Occupational Stress Questionnaire    Feeling of Stress : To some extent  Social Connections: Socially Isolated (05/27/2020)   Social Connection and Isolation Panel  Frequency of Communication with Friends and Family: More than three times a week    Frequency of Social Gatherings with Friends and Family: More than three times a week    Attends Religious Services: Never    Database Administrator or Organizations: No    Attends Banker Meetings: Never    Marital Status: Never married    Review of Systems Per HPI  Objective:  BP 132/81   Pulse 90   SpO2 98%      01/15/2024   10:02 AM 12/17/2023    2:17 PM 12/17/2023    1:49 PM  BP/Weight  Systolic BP 132 138 165  Diastolic BP 81 91 88  Wt. (Lbs)   212  BMI   30.42 kg/m2    Physical Exam Vitals and nursing note  reviewed.  Constitutional:      General: She is not in acute distress. HENT:     Head: Normocephalic and atraumatic.  Cardiovascular:     Rate and Rhythm: Normal rate and regular rhythm.  Pulmonary:     Effort: Pulmonary effort is normal.     Breath sounds: Normal breath sounds. No wheezing or rales.  Abdominal:     General: There is no distension.     Palpations: Abdomen is soft.     Tenderness: There is no abdominal tenderness.  Neurological:     Mental Status: She is alert.     Lab Results  Component Value Date   WBC 8.4 05/04/2022   HGB 13.8 05/04/2022   HCT 41.9 05/04/2022   PLT 181 05/04/2022   GLUCOSE 93 07/04/2023   CHOL 209 (H) 07/04/2023   TRIG 86 07/04/2023   HDL 55 07/04/2023   LDLCALC 139 (H) 07/04/2023   ALT 30 07/04/2023   AST 22 07/04/2023   NA 144 07/04/2023   K 4.4 07/04/2023   CL 109 (H) 07/04/2023   CREATININE 1.02 (H) 07/04/2023   BUN 12 07/04/2023   CO2 22 07/04/2023   TSH 0.661 09/23/2018   INR 1.2 05/04/2022   HGBA1C 5.7 (H) 11/10/2016     Assessment & Plan:  Alzheimer disease (HCC) Assessment & Plan: Having significant agitation.  Trial of Rexulti.  Orders: -     Brexpiprazole; 0.5 mg once daily for 7 days, increase dose on days 8 to 14 to 1 mg once daily, then on day 15 to the target dose of 2 mg once daily.  Dispense: 40 tablet; Refill: 0  Gait difficulty -     Ambulatory referral to Home Health  Primary hypertension Assessment & Plan: BP stable.   Recurrent UTI Assessment & Plan: Treating with Keflex . After completion of course, starting Keflex  daily for prophylaxis.  Orders: -     Cephalexin ; Take 1 capsule (500 mg total) by mouth 2 (two) times daily.  Dispense: 14 capsule; Refill: 0 -     Cephalexin ; Take 1 capsule (250 mg total) by mouth daily. For Prophylaxis. To be started after 1 week course of Keflex .  Dispense: 90 capsule; Refill: 1 -     Urinalysis    Follow-up:  1 month  Kayelyn Lemon DO [facility]

## 2024-01-15 NOTE — Telephone Encounter (Signed)
 Pharmacy Patient Advocate Encounter   Received notification from CoverMyMeds that prior authorization for Rexulti  1MG  tablets is required/requested.   Insurance verification completed.   The patient is insured through [facility] ADVANTAGE/RX ADVANCE.   Per test claim: Max daily dose of 30 tablets every 30 days.  Please resend as 2 separate prescriptions.   1.) for 1mg  tablets take 1/2 tablet once a day for 7 days and then 1 tablet once a day for 7 days  2.) for 2mg  tablets 1 tablet tablet once a day

## 2024-01-15 NOTE — Assessment & Plan Note (Signed)
 BP stable.

## 2024-01-15 NOTE — Patient Instructions (Addendum)
 Referral to PT placed.  New medication sent in for agitation.  Medications sent in for UTI (for treatment and prophylaxis).  Follow up in 1 month.

## 2024-01-17 DIAGNOSIS — R10A1 Flank pain, right side: Secondary | ICD-10-CM | POA: Diagnosis not present

## 2024-01-19 LAB — URINE CULTURE

## 2024-01-21 ENCOUNTER — Ambulatory Visit: Payer: Self-pay | Admitting: Physician Assistant

## 2024-01-21 ENCOUNTER — Encounter: Payer: Self-pay | Admitting: Family Medicine

## 2024-01-29 NOTE — Telephone Encounter (Signed)
 Cook, Jayce G, DO     01/22/24  9:29 PM Can you just send this to [facility]. I already put referral in and instructed to NOT use [facility] and she just sent it to [facility].

## 2024-01-29 NOTE — Telephone Encounter (Signed)
Message sent to referral coordinator

## 2024-02-01 ENCOUNTER — Other Ambulatory Visit: Payer: Self-pay | Admitting: Family Medicine

## 2024-02-01 DIAGNOSIS — F0280 Dementia in other diseases classified elsewhere without behavioral disturbance: Secondary | ICD-10-CM

## 2024-02-11 ENCOUNTER — Ambulatory Visit: Payer: Self-pay

## 2024-02-11 NOTE — Telephone Encounter (Signed)
" °  FYI Only or Action Required?: Action required by provider: patient sister reporting side effects from medication requesting call back .  Patient was last seen in primary care on 01/15/2024 by Cook, Jayce G, DO.  Called Nurse Triage reporting Medication Problem.  Symptoms began several days ago.  Interventions attempted: Other: n/a.  Symptoms are: stable.  Triage Disposition: Call PCP When Office is Open  Patient/caregiver understands and will follow disposition?: Yes  Copied from CRM #[id number]. Topic: Clinical - Red Word Triage >> Feb 11, 2024  2:46 PM Wess RAMAN wrote: Red Word that prompted transfer to Nurse Triage: Patient's sister, Holli, would like clarification on how to take cephALEXin  (KEFLEX ) 250 MG capsule and cephALEXin  (KEFLEX ) 500 MG capsule  Patient has been on REXULTI  1 MG TABS tablet for 1 month. Patient was not sleeping, having Hallucinations. Her sister had to take her off the medication to get rest. Reason for Disposition  [1] Caller has NON-URGENT medicine question about med that PCP prescribed AND [2] triager unable to answer question  Answer Assessment - Initial Assessment Questions Patients sister sheila with patient who has dementia . Sister is  reporting Needs medication verification of rexulti  started out with 1/2 tab now at 1 tab of 1 mg. Not working  having hallucinating. Has had these before.  Not sleeping agitated . If does go to sleep body jumping took her off the rexulti  she was not sleeping. Feels symptoms worse when increased the dose. Since stopping the medication the hallucinating is not as bad but agitation increased. Reporting patient did spit up some phlegm with blood mixed mostly pink. Does mention she has been tired a lot recently   Per chart review was seen in office 01/15/2024   Finished the keflex  1 week course , wanted to clarify the other prescription for the prophylaxis reviewed chart visit from 01/15/2024 and confirmed 'Recurrent  UTI Assessment & Plan: Treating with Keflex . After completion of course, starting Keflex  daily for prophylaxis.     1. NAME of MEDICINE: What medicine(s) are you calling about?     Rexulti   2. QUESTION: What is your question? (e.g., double dose of medicine, side effect)     Side effects of increased hallucinations , sister stopped this medication hallucinations improved  but patient having increased agitation  3. PRESCRIBER: Who prescribed the medicine? Reason: if prescribed by specialist, call should be referred to that group.    PCP 4. SYMPTOMS: Do you have any symptoms? If Yes, ask: What symptoms are you having?  How bad are the symptoms (e.g., mild, moderate, severe)     Was having trouble sleeping with rexulti  . Sister also reported patient spit up  some phlegm mixed with blood not pure blood 1 time and has not since   Patient sister denies the following patient with chest pain reported, difficulty breathing beyond baseline, vomiting, dizziness ,syncope  Protocols used: Medication Question Call-A-AH  "

## 2024-02-15 NOTE — Telephone Encounter (Signed)
 Cook, Jayce G, DO     02/14/24  8:01 PM It appears that she has stopped the medication. If anything else is needed, please let me know.   Jacqulyn Ahle DO [facility]

## 2024-02-20 ENCOUNTER — Ambulatory Visit (INDEPENDENT_AMBULATORY_CARE_PROVIDER_SITE_OTHER): Admitting: Family Medicine

## 2024-02-20 VITALS — BP 143/84 | HR 60 | Temp 98.2°F | Ht 70.0 in | Wt 199.0 lb

## 2024-02-20 DIAGNOSIS — I10 Essential (primary) hypertension: Secondary | ICD-10-CM | POA: Diagnosis not present

## 2024-02-20 DIAGNOSIS — N390 Urinary tract infection, site not specified: Secondary | ICD-10-CM | POA: Diagnosis not present

## 2024-02-20 DIAGNOSIS — R296 Repeated falls: Secondary | ICD-10-CM

## 2024-02-20 DIAGNOSIS — F0280 Dementia in other diseases classified elsewhere without behavioral disturbance: Secondary | ICD-10-CM | POA: Diagnosis not present

## 2024-02-20 DIAGNOSIS — G309 Alzheimer's disease, unspecified: Secondary | ICD-10-CM

## 2024-02-20 DIAGNOSIS — Z23 Encounter for immunization: Secondary | ICD-10-CM | POA: Diagnosis not present

## 2024-02-20 NOTE — Assessment & Plan Note (Signed)
Fair control Will continue to monitor

## 2024-02-20 NOTE — Assessment & Plan Note (Signed)
 Doing well on prophylactic Keflex  at this time.  Continue.

## 2024-02-20 NOTE — Assessment & Plan Note (Signed)
 Doing okay at this time.  We have discontinued Rexulti .  Continue close monitoring.

## 2024-02-20 NOTE — Assessment & Plan Note (Signed)
 Placing additional referral to home health PT.

## 2024-02-20 NOTE — Patient Instructions (Signed)
 Continue current medications.  Follow up in 3 months.  Placing referral again to home health PT

## 2024-02-20 NOTE — Progress Notes (Signed)
 "  Subjective:  Patient ID: Erin Wall, female    DOB: 04-04-1945  Age: 79 y.o. MRN: [id number]  CC:   Chief Complaint  Patient presents with   Follow-up    1 month follow up - patient fell twice since last visit. Family C/O on spot on patients back that has got worse. Pt family took her off Resulti due to it causing hallucation and agitation and started her back on the Lorazepam .    HPI:  79 year old female presents for follow-up.  Patient is with her caregiver today.  No recent UTIs.  Doing well on prophylactic Keflex .  Patient did not tolerate Rexulti  due to behavioral disturbance and hallucinations.  This has been stopped.  Caregiver states that she is doing okay at this time.  There is an area of concern to the right upper back that they would like me to examine today.  Additionally, caregiver desires her to have a flu shot.  Lastly, for some reason home physical therapy has not been set up.  2 referrals have been placed.  Will place an additional follow-up today.  Patient Active Problem List   Diagnosis Date Noted   Frequent falls 02/20/2024   Recurrent UTI 01/15/2024   Urinary incontinence 03/20/2022   Low back pain 03/20/2022   Alzheimer disease (HCC) 05/27/2020   History of DVT of lower extremity 05/27/2020   S/P hysterectomy 05/27/2020   Unilateral primary osteoarthritis, right knee 01/17/2019   Hyperlipidemia 05/09/2012   Hypertension 05/09/2012    Social Hx   Social History   Socioeconomic History   Marital status: Single    Spouse name: Not on file   Number of children: Not on file   Years of education: 18   Highest education level: Master's degree (e.g., MA, MS, MEng, MEd, MSW, MBA)  Occupational History   Occupation: retired runner, broadcasting/film/video   Tobacco Use   Smoking status: Never   Smokeless tobacco: Never  Vaping Use   Vaping status: Never Used  Substance and Sexual Activity   Alcohol use: No   Drug use: No   Sexual activity: Not Currently     Birth control/protection: Surgical, Post-menopausal    Comment: hyst  Other Topics Concern   Not on file  Social History Narrative   Lives one level lives alone   Right handed   Caffeine - not much    Exercise - some   Education - masters   Social Drivers of Health   Tobacco Use: Low Risk (02/20/2024)   Patient History    Smoking Tobacco Use: Never    Smokeless Tobacco Use: Never    Passive Exposure: Not on file  Financial Resource Strain: Low Risk (02/20/2024)   Overall Financial Resource Strain (CARDIA)    Difficulty of Paying Living Expenses: Not hard at all  Food Insecurity: No Food Insecurity (02/20/2024)   Epic    Worried About Radiation Protection Practitioner of Food in the Last Year: Never true    Ran Out of Food in the Last Year: Never true  Transportation Needs: No Transportation Needs (02/20/2024)   Epic    Lack of Transportation (Medical): No    Lack of Transportation (Non-Medical): No  Physical Activity: Inactive (02/20/2024)   Exercise Vital Sign    Days of Exercise per Week: 0 days    Minutes of Exercise per Session: Not on file  Stress: Stress Concern Present (02/20/2024)   [employer] of Occupational Health - Occupational Stress Questionnaire    Feeling of Stress:  To some extent  Social Connections: Unknown (02/20/2024)   Social Connection and Isolation Panel    Frequency of Communication with Friends and Family: Never    Frequency of Social Gatherings with Friends and Family: Patient declined    Attends Religious Services: Never    Database Administrator or Organizations: Yes    Attends Banker Meetings: Never    Marital Status: Never married  Depression (PHQ2-9): Medium Risk (07/04/2023)   Depression (PHQ2-9)    PHQ-2 Score: 10  Alcohol Screen: Not on file  Housing: Low Risk (02/20/2024)   Epic    Unable to Pay for Housing in the Last Year: No    Number of Times Moved in the Last Year: 0    Homeless in the Last Year: No  Utilities: Not on file  Health Literacy:  Not on file    Review of Systems Per HPI  Objective:  BP (!) 143/84   Pulse 60   Temp 98.2 F (36.8 C)   Ht 5' 10 (1.778 m)   Wt 199 lb (90.3 kg)   SpO2 98%   BMI 28.55 kg/m      02/20/2024   11:58 AM 02/20/2024   11:08 AM 01/15/2024   10:02 AM  BP/Weight  Systolic BP 143 157 132  Diastolic BP 84 91 81  Wt. (Lbs)  199   BMI  28.55 kg/m2     Physical Exam Vitals and nursing note reviewed.  Constitutional:      General: She is not in acute distress.    Appearance: Normal appearance.  HENT:     Head: Normocephalic and atraumatic.  Cardiovascular:     Rate and Rhythm: Normal rate and regular rhythm.  Pulmonary:     Effort: Pulmonary effort is normal.     Breath sounds: Normal breath sounds.  Musculoskeletal:     Comments: Right side of the upper back with a slightly firm raised area which is consistent with tension/spasm of the muscles.  Neurological:     Mental Status: She is alert.     Lab Results  Component Value Date   WBC 8.4 05/04/2022   HGB 13.8 05/04/2022   HCT 41.9 05/04/2022   PLT 181 05/04/2022   GLUCOSE 93 07/04/2023   CHOL 209 (H) 07/04/2023   TRIG 86 07/04/2023   HDL 55 07/04/2023   LDLCALC 139 (H) 07/04/2023   ALT 30 07/04/2023   AST 22 07/04/2023   NA 144 07/04/2023   K 4.4 07/04/2023   CL 109 (H) 07/04/2023   CREATININE 1.02 (H) 07/04/2023   BUN 12 07/04/2023   CO2 22 07/04/2023   TSH 0.661 09/23/2018   INR 1.2 05/04/2022   HGBA1C 5.7 (H) 11/10/2016     Assessment & Plan:  Alzheimer disease [facility]) Assessment & Plan: Doing okay at this time.  We have discontinued Rexulti .  Continue close monitoring.  Orders: -     Ambulatory referral to Home Health  Flu vaccine need -     Flu vaccine HIGH DOSE PF(Fluzone Trivalent)  Frequent falls Assessment & Plan: Placing additional referral to home health PT.  Orders: -     Ambulatory referral to Home Health  Recurrent UTI Assessment & Plan: Doing well on prophylactic Keflex  at  this time.  Continue.   Primary hypertension Assessment & Plan: Fair control.  Will continue to monitor.     Follow-up: 3 months  Yekaterina Escutia Bluford DO [facility] "

## 2024-02-28 ENCOUNTER — Encounter (HOSPITAL_COMMUNITY): Payer: Self-pay

## 2024-02-28 ENCOUNTER — Emergency Department (HOSPITAL_COMMUNITY)

## 2024-02-28 ENCOUNTER — Emergency Department (HOSPITAL_COMMUNITY)
Admission: EM | Admit: 2024-02-28 | Discharge: 2024-03-04 | Disposition: A | Attending: Emergency Medicine | Admitting: Emergency Medicine

## 2024-02-28 ENCOUNTER — Other Ambulatory Visit: Payer: Self-pay

## 2024-02-28 DIAGNOSIS — M1711 Unilateral primary osteoarthritis, right knee: Secondary | ICD-10-CM | POA: Insufficient documentation

## 2024-02-28 DIAGNOSIS — Z86718 Personal history of other venous thrombosis and embolism: Secondary | ICD-10-CM | POA: Insufficient documentation

## 2024-02-28 DIAGNOSIS — M79604 Pain in right leg: Secondary | ICD-10-CM | POA: Diagnosis present

## 2024-02-28 DIAGNOSIS — M48061 Spinal stenosis, lumbar region without neurogenic claudication: Secondary | ICD-10-CM | POA: Diagnosis not present

## 2024-02-28 DIAGNOSIS — M25551 Pain in right hip: Secondary | ICD-10-CM | POA: Diagnosis not present

## 2024-02-28 DIAGNOSIS — Z7901 Long term (current) use of anticoagulants: Secondary | ICD-10-CM | POA: Insufficient documentation

## 2024-02-28 DIAGNOSIS — W19XXXA Unspecified fall, initial encounter: Secondary | ICD-10-CM | POA: Diagnosis not present

## 2024-02-28 DIAGNOSIS — M47817 Spondylosis without myelopathy or radiculopathy, lumbosacral region: Secondary | ICD-10-CM | POA: Insufficient documentation

## 2024-02-28 DIAGNOSIS — M47816 Spondylosis without myelopathy or radiculopathy, lumbar region: Secondary | ICD-10-CM | POA: Insufficient documentation

## 2024-02-28 DIAGNOSIS — M899 Disorder of bone, unspecified: Secondary | ICD-10-CM | POA: Diagnosis not present

## 2024-02-28 DIAGNOSIS — S8001XA Contusion of right knee, initial encounter: Secondary | ICD-10-CM

## 2024-02-28 DIAGNOSIS — F0390 Unspecified dementia without behavioral disturbance: Secondary | ICD-10-CM | POA: Diagnosis not present

## 2024-02-28 DIAGNOSIS — Z9181 History of falling: Secondary | ICD-10-CM | POA: Diagnosis not present

## 2024-02-28 DIAGNOSIS — S7001XA Contusion of right hip, initial encounter: Secondary | ICD-10-CM

## 2024-02-28 MED ORDER — ACETAMINOPHEN 325 MG PO TABS
650.0000 mg | ORAL_TABLET | Freq: Once | ORAL | Status: AC
  Administered 2024-02-28: 650 mg via ORAL
  Filled 2024-02-28: qty 2

## 2024-02-28 NOTE — ED Provider Notes (Signed)
 "  EMERGENCY DEPARTMENT AT [facility] Provider Note   CSN: [id number] Arrival date & time: 02/28/24  2108     Patient presents with: Erin Wall is a 79 y.o. female.  History of advanced dementia and lives with her sister who was at bedside and provides the history.  She states patient got up around 5 AM to go to the bathroom and fell like the toilet, no loss of consciousness, EMS had to come get her up off the ground and move her back to the bed.  At that time there was not noticed to be any injury so she was not transported to the hospital, family states that throughout the day they have noticed that she does not want to move her right leg again complains of pain anytime they tried to, primarily to the right knee and right hip.  Normally she walks with assistance but will not put any weight on this leg today.  She does take Xarelto  for history of DVT and has been compliant with this.  She also has history of frequent falls.    Fall       Prior to Admission medications  Medication Sig Start Date End Date Taking? Authorizing Provider  cephALEXin  (KEFLEX ) 250 MG capsule Take 1 capsule (250 mg total) by mouth daily. For Prophylaxis. To be started after 1 week course of Keflex . 01/15/24   Cook, Jayce G, DO  EPINEPHrine  0.3 mg/0.3 mL IJ SOAJ injection Inject 0.3 mLs (0.3 mg total) into the muscle as needed for anaphylaxis. 09/15/19   Schuyler Charlie RAMAN, MD  LORazepam  (ATIVAN ) 0.5 MG tablet Take 1 tablet by mouth twice daily as needed for anxiety 11/18/23   Bluford, Jayce G, DO  mirtazapine  (REMERON ) 30 MG tablet TAKE 1 TABLET BY MOUTH AT BEDTIME 12/10/23   Cook, Jayce G, DO  XARELTO  20 MG TABS tablet TAKE 1 TABLET BY MOUTH ONCE DAILY WITH SUPPER 12/25/23   Cook, Jayce G, DO    Allergies: Pravachol, Shellfish allergy, and Brexpiprazole     Review of Systems  Updated Vital Signs BP (!) 165/94   Pulse 95   Temp 97.9 F (36.6 C) (Axillary)   Resp 18   Ht 5' 10  (1.778 m)   SpO2 97%   BMI 28.55 kg/m   Physical Exam Vitals and nursing note reviewed.  Constitutional:      General: She is not in acute distress.    Appearance: She is well-developed.  HENT:     Head: Normocephalic and atraumatic.     Mouth/Throat:     Mouth: Mucous membranes are moist.  Eyes:     Conjunctiva/sclera: Conjunctivae normal.     Pupils: Pupils are equal, round, and reactive to light.  Cardiovascular:     Rate and Rhythm: Normal rate and regular rhythm.     Heart sounds: No murmur heard. Pulmonary:     Effort: Pulmonary effort is normal. No respiratory distress.     Breath sounds: Normal breath sounds.  Abdominal:     Palpations: Abdomen is soft.     Tenderness: There is no abdominal tenderness.  Musculoskeletal:        General: No swelling.     Cervical back: Neck supple.     Comments: There is no swelling or bruising to the right leg noted, there is no focal tenderness over the right hip knee calf or ankle or anterior tib-fib, there is no deformity.  DP and PT pulses  are intact, her leg is warm and well-perfused, when attempting to range the right hip and knee patient resists and grimaces.  She has normal range of motion of the right ankle without tenderness.  Normal range of motion of left hip knee and ankle.  Skin:    General: Skin is warm and dry.     Capillary Refill: Capillary refill takes less than 2 seconds.  Neurological:     General: No focal deficit present.     Mental Status: She is alert and oriented to person, place, and time.     Sensory: No sensory deficit.     Motor: No weakness.  Psychiatric:        Mood and Affect: Mood normal.     (all labs ordered are listed, but only abnormal results are displayed) Labs Reviewed - No data to display  EKG: None  Radiology: DG Knee Complete 4 Views Right Result Date: 02/28/2024 EXAM: 4 VIEW(S) XRAY OF THE KNEE 02/28/2024 09:49:00 PM COMPARISON: 11/15/2018 CLINICAL HISTORY: fall fall fall fall fall  FINDINGS: BONES AND JOINTS: No acute fracture. No malalignment. No significant joint effusion. Moderate tricompartmental degenerative changes including joint space narrowing, subchondral sclerosis, and marginal osteophytosis. SOFT TISSUES: Unremarkable. IMPRESSION: 1. No evidence of acute traumatic injury. Electronically signed by: Morgane Naveau MD 02/28/2024 10:04 PM EST RP Workstation: [id number]     Procedures   Medications Ordered in the ED - No data to display                                  Medical Decision Making Differential diagnosis includes but not limited to fracture, intracranial hemorrhage, contusion, sprain, strain, other  Course: Patient lives at home with her sister who is her caretaker, sister provides history, patient lost balance in the bathroom and had a fall without head injury or loss of consciousness, she is on blood thinners, CT ordered, no intracranial hemorrhage, she had no neck tenderness, no chest or abdominal trauma or extremity pain in her upper extremities but is having limited range of motion and pain in the right hip, she seems to have some pain with range of motion of the knee as well but the decreased range of motion is worst at the hip on my exam, she has intact distal pulses.  X-ray of the right tib-fib, femur, hip are all negative for fracture or dislocation though patient does have osteoarthritis.  CT pelvis ordered to rule out occult fracture.  I updated patient's sister who is at bedside on plan, signed out to Dr. Raford.  Amount and/or Complexity of Data Reviewed Radiology: ordered.  Risk OTC drugs.        Final diagnoses:  None    ED Discharge Orders     None          [name] [name] 02/28/24 2341    Patsey Lot, MD 02/29/24 2200  "

## 2024-02-28 NOTE — ED Provider Notes (Signed)
 Care assumed from [facility] [place], patient with fall and apparent hip pain but negative x-rays.  CT is pending to rule out occult fracture.  CT shows no evidence of fracture.  Have independently viewed the images, and agree with the radiologist's interpretation.  Attempt was made to ambulate the patient but she was not able to bear any weight.  Family member who would be her caregiver states that they are unable to manage her if she is not ambulatory with assistance.   am holding her in the ED for transitions of care service to work on appropriate placement.   Raford Lenis, MD 02/29/24 [phone]

## 2024-02-28 NOTE — ED Triage Notes (Signed)
 Pt BIB EMS. Per EMS pt has advanced dementia. Had an unwitnessed fall yesterday was found beside the toilet. Pt did not lose consciousness. Pain only in right leg.  Alert only oriented to name. Pt makes grimacing nose when lifting or palpating right leg only. Pt is on blood thinners.

## 2024-02-29 LAB — CBC WITH DIFFERENTIAL/PLATELET
Abs Immature Granulocytes: 0.02 K/uL (ref 0.00–0.07)
Basophils Absolute: 0 K/uL (ref 0.0–0.1)
Basophils Relative: 0 %
Eosinophils Absolute: 0.1 K/uL (ref 0.0–0.5)
Eosinophils Relative: 2 %
HCT: 45.4 % (ref 36.0–46.0)
Hemoglobin: 15.1 g/dL — ABNORMAL HIGH (ref 12.0–15.0)
Immature Granulocytes: 0 %
Lymphocytes Relative: 32 %
Lymphs Abs: 2 K/uL (ref 0.7–4.0)
MCH: 28.5 pg (ref 26.0–34.0)
MCHC: 33.3 g/dL (ref 30.0–36.0)
MCV: 85.7 fL (ref 80.0–100.0)
Monocytes Absolute: 0.5 K/uL (ref 0.1–1.0)
Monocytes Relative: 9 %
Neutro Abs: 3.4 K/uL (ref 1.7–7.7)
Neutrophils Relative %: 57 %
Platelets: 232 K/uL (ref 150–400)
RBC: 5.3 MIL/uL — ABNORMAL HIGH (ref 3.87–5.11)
RDW: 13.3 % (ref 11.5–15.5)
WBC: 6.1 K/uL (ref 4.0–10.5)
nRBC: 0 % (ref 0.0–0.2)

## 2024-02-29 LAB — BASIC METABOLIC PANEL WITH GFR
Anion gap: 14 (ref 5–15)
BUN: 8 mg/dL (ref 8–23)
CO2: 18 mmol/L — ABNORMAL LOW (ref 22–32)
Calcium: 9.3 mg/dL (ref 8.9–10.3)
Chloride: 106 mmol/L (ref 98–111)
Creatinine, Ser: 0.91 mg/dL (ref 0.44–1.00)
GFR, Estimated: >60 mL/min
Glucose, Bld: 104 mg/dL — ABNORMAL HIGH (ref 70–99)
Potassium: 3.8 mmol/L (ref 3.5–5.1)
Sodium: 138 mmol/L (ref 135–145)

## 2024-02-29 MED ORDER — RIVAROXABAN 20 MG PO TABS
20.0000 mg | ORAL_TABLET | Freq: Every day | ORAL | Status: DC
  Administered 2024-02-29 – 2024-03-01 (×2): 20 mg via ORAL
  Filled 2024-02-29 (×2): qty 1

## 2024-02-29 MED ORDER — ACETAMINOPHEN 325 MG PO TABS: 650.0000 mg | ORAL_TABLET | ORAL | Status: DC | PRN

## 2024-02-29 MED ORDER — MIRTAZAPINE 15 MG PO TABS
30.0000 mg | ORAL_TABLET | Freq: Every day | ORAL | Status: DC
  Administered 2024-02-29 – 2024-03-03 (×4): 30 mg via ORAL
  Filled 2024-02-29 (×4): qty 2

## 2024-02-29 MED ORDER — CEPHALEXIN 250 MG PO CAPS
250.0000 mg | ORAL_CAPSULE | Freq: Every day | ORAL | Status: DC
  Administered 2024-02-29 – 2024-03-04 (×5): 250 mg via ORAL
  Filled 2024-02-29 (×7): qty 1

## 2024-02-29 NOTE — ED Notes (Signed)
 Pt wasn't able to ambulate.Writer tried to get Pt to sit up on the side of the bed she was unable she mentioned pain in both of her legs.

## 2024-02-29 NOTE — Plan of Care (Signed)
" °  Problem: Acute Rehab OT Goals (only OT should resolve) Goal: Pt. Will Perform Eating Flowsheets (Taken 02/29/2024 1042) Pt Will Perform Eating: with modified independence Goal: Pt. Will Perform Grooming Flowsheets (Taken 02/29/2024 1042) Pt Will Perform Grooming: with supervision Goal: Pt. Will Perform Upper Body Dressing Flowsheets (Taken 02/29/2024 1042) Pt Will Perform Upper Body Dressing: with supervision Goal: Pt. Will Perform Lower Body Dressing Flowsheets (Taken 02/29/2024 1042) Pt Will Perform Lower Body Dressing: with supervision Goal: Pt. Will Transfer To Toilet Flowsheets (Taken 02/29/2024 1042) Pt Will Transfer to Toilet:  with contact guard assist  stand pivot transfer Goal: Pt. Will Perform Toileting-Clothing Manipulation Flowsheets (Taken 02/29/2024 1042) Pt Will Perform Toileting - Clothing Manipulation and hygiene:  with supervision  with contact guard assist Goal: Pt/Caregiver Will Perform Home Exercise Program Flowsheets (Taken 02/29/2024 1042) Pt/caregiver will Perform Home Exercise Program:  Increased strength  Increased ROM  Both right and left upper extremity  With Supervision  Lavelle Berland OT, MOT  "

## 2024-02-29 NOTE — Evaluation (Signed)
 Physical Therapy Evaluation Patient Details Name: Erin Wall MRN: [id number] DOB: 27-Jul-1945 Today's Date: 02/29/2024  History of Present Illness  Erin Wall is a 79 y.o. female, seen on rounds today.  Pt initially presented to the ED for complaints of Fall  Currently, the patient is awake and alert.  She has a hx of dementia and fell yesterday.  She is still unable to ambulate and family is unable to care for her.  Plan is for PT and TOC eval. (per MD)   Clinical Impression  Patient requires repeated verbal/tactile cueing for following directions with fair/poor carryover. Patient demonstrates slow labored movement for sitting up at bedside, once seated able to maintain sitting balance, but require increased time and repeated attempts before able to complete sit to stand and unable to take steps at bedside due to BLE weakness and fatigue. Patient put back to bed with mod assist for repositioning. Patient will benefit from continued skilled physical therapy in hospital and recommended venue below to increase strength, balance, endurance for safe ADLs and gait.          If plan is discharge home, recommend the following: A lot of help with walking and/or transfers;A lot of help with bathing/dressing/bathroom;Assistance with cooking/housework;Direct supervision/assist for medications management;Direct supervision/assist for financial management;Assist for transportation;Help with stairs or ramp for entrance;Supervision due to cognitive status   Can travel by private vehicle   No    Equipment Recommendations None recommended by PT  Recommendations for Other Services       Functional Status Assessment Patient has had a recent decline in their functional status and demonstrates the ability to make significant improvements in function in a reasonable and predictable amount of time.     Precautions / Restrictions Precautions Precautions: Fall Recall of Precautions/Restrictions:  Impaired Restrictions Weight Bearing Restrictions Per Provider Order: No      Mobility  Bed Mobility Overal bed mobility: Needs Assistance Bed Mobility: Supine to Sit, Sit to Supine     Supine to sit: Mod assist, Max assist Sit to supine: Mod assist   General bed mobility comments: required constant verbal, tactile cueing for following directions, slow labored movement    Transfers Overall transfer level: Needs assistance Equipment used: Rolling walker (2 wheels) Transfers: Sit to/from Stand             General transfer comment: Max A for sit to stand by physical therapist at EOB. Pt unable to take steps to the chair today despite attempts and cuing to do so.    Ambulation/Gait                  Stairs            Wheelchair Mobility     Tilt Bed    Modified Rankin (Stroke Patients Only)       Balance Overall balance assessment: Needs assistance Sitting-balance support: No upper extremity supported, Feet supported Sitting balance-Leahy Scale: Fair Sitting balance - Comments: fair to good seated at EOB.   Standing balance support: Bilateral upper extremity supported, During functional activity, Reliant on assistive device for balance Standing balance-Leahy Scale: Poor Standing balance comment: using RW                             Pertinent Vitals/Pain Pain Assessment Pain Assessment: No/denies pain    Home Living Family/patient expects to be discharged to:: Private residence Living Arrangements: Other relatives Available Help at  Discharge: Family;Available PRN/intermittently               Additional Comments: Patient is poor historian    Prior Function Prior Level of Function : Needs assist       Physical Assist : Mobility (physical);ADLs (physical) Mobility (physical): Bed mobility;Transfers;Gait;Stairs   Mobility Comments: Household ambulation using RW ADLs Comments: Assisted by family     Extremity/Trunk  Assessment   Upper Extremity Assessment Upper Extremity Assessment: Defer to OT evaluation    Lower Extremity Assessment Lower Extremity Assessment: Generalized weakness    Cervical / Trunk Assessment Cervical / Trunk Assessment: Kyphotic  Communication   Communication Communication: Impaired Factors Affecting Communication: Difficulty expressing self    Cognition Arousal: Alert Behavior During Therapy: WFL for tasks assessed/performed, Anxious   PT - Cognitive impairments: Awareness, Initiation                       PT - Cognition Comments: requires repeated verbal/tactile curing for following directions Following commands: Impaired Following commands impaired: Follows one step commands inconsistently     Cueing Cueing Techniques: Verbal cues, Tactile cues, Gestural cues     General Comments      Exercises     Assessment/Plan    PT Assessment Patient needs continued PT services  PT Problem List Decreased strength;Decreased activity tolerance;Decreased balance;Decreased mobility       PT Treatment Interventions DME instruction;Gait training;Stair training;Functional mobility training;Therapeutic activities;Therapeutic exercise;Balance training;Cognitive remediation;Patient/family education    PT Goals (Current goals can be found in the Care Plan section)  Acute Rehab PT Goals Patient Stated Goal: return homet PT Goal Formulation: With patient Time For Goal Achievement: 03/14/24 Potential to Achieve Goals: Good    Frequency Min 2X/week     Co-evaluation PT/OT/SLP Co-Evaluation/Treatment: Yes Reason for Co-Treatment: To address functional/ADL transfers PT goals addressed during session: Mobility/safety with mobility;Balance;Proper use of DME OT goals addressed during session: ADL's and self-care       AM-PAC PT 6 Clicks Mobility  Outcome Measure Help needed turning from your back to your side while in a flat bed without using bedrails?: A  Lot Help needed moving from lying on your back to sitting on the side of a flat bed without using bedrails?: A Lot Help needed moving to and from a bed to a chair (including a wheelchair)?: A Lot Help needed standing up from a chair using your arms (e.g., wheelchair or bedside chair)?: A Lot Help needed to walk in hospital room?: Total Help needed climbing 3-5 steps with a railing? : Total 6 Click Score: 10    End of Session   Activity Tolerance: Patient tolerated treatment well;Patient limited by fatigue Patient left: in bed;with call bell/phone within reach;with bed alarm set Nurse Communication: Mobility status PT Visit Diagnosis: Unsteadiness on feet (R26.81);Other abnormalities of gait and mobility (R26.89);Muscle weakness (generalized) (M62.81)    Time: 9166-9140 PT Time Calculation (min) (ACUTE ONLY): 26 min   Charges:   PT Evaluation $PT Eval Moderate Complexity: 1 Mod PT Treatments $Therapeutic Activity: 23-37 mins PT General Charges $$ ACUTE PT VISIT: 1 Visit         12:27 PM, 02/29/24 Lynwood Music, MPT Physical Therapist with [facility] 336 [phone] office [phone] mobile phone

## 2024-02-29 NOTE — ED Notes (Addendum)
 CSW visited patient at bedside to see if family was present, no one was at bedside. Patient was alert to self but very confused, not making any sense when asking her questions like, what she was watching on TV and if she knew where she were. Patient was able to confirm that she lives with her sister. CSW will reach out to family after PT sees for recommendation.   Transition of Care [facility]) - Emergency Department Mini Assessment   Patient Details  Name: Erin Wall MRN: [id number] Date of Birth: [birth year]  Transition of Care [facility]) CM/SW Contact:    Noreen KATHEE Cleotilde ISRAEL Phone Number: 02/29/2024, 10:01 AM   Clinical Narrative:  CSW spoke with sister about SNF recommendation and facility bed options. Sister asked if CSW could send patient referral out to close facilities. CSW informed sister, that it will be based on who accepts patient insurance and can offer her a bed, sister expressed understanding.  ED Mini Assessment: What brought you to the Emergency Department? : [place] and stopped walking  Barriers to Discharge: Other (must enter comment) (SNF bed availability that accepts insurance)  Barrier interventions: Placement that [employer] of departure: Not know  Interventions which prevented an admission or readmission: SNF Placement    Patient Contact and Communications Key Contact 1: Erin Wall with: sister Contact Date: 02/29/24,   Contact time: 0845 Contact Phone Number: [phone] Call outcome: Placement options  Patient states their goals for this hospitalization and ongoing recovery are:: walk again - seek short-term rehab CMS Medicare.gov Compare Post Acute Care list provided to:: Patient Represenative (must comment) (Sister [facility]) Choice offered to / list presented to : Sibling  Admission diagnosis:  Leg Pain Patient Active Problem List   Diagnosis Date Noted   Frequent falls 02/20/2024   Recurrent UTI 01/15/2024   Urinary incontinence  03/20/2022   Low back pain 03/20/2022   Alzheimer disease (HCC) 05/27/2020   History of DVT of lower extremity 05/27/2020   S/P hysterectomy 05/27/2020   Unilateral primary osteoarthritis, right knee 01/17/2019   Hyperlipidemia 05/09/2012   Hypertension 05/09/2012   PCP:  Cook, Jayce G, DO Pharmacy:   [facility] [address] [place] - [address] [place] [address] Phone: [phone] Fax: [phone]     Addendum 11:00 AM   CSW spoke with sister about [facility] being able to offer patient a bed. Sister accepted bed offer- Facility updated and they will start auth once PT note is in.    Addendum 1:04 PM   [facility] started auth .

## 2024-02-29 NOTE — Progress Notes (Signed)
 30 Day Note:  MUST ID: [id number]  DOB:[birth year]  To whom it May Concern:  Please be advised that the above name patient will require a short-term nursing home stay- anticipated 30 days or less rehabilitation and strengthening. The plan is for return home.   Dementia note  Please be advised that the above-named patient has a primary diagnosis of dementia which supersedes any psychiatric diagnosis

## 2024-02-29 NOTE — Plan of Care (Signed)
" °  Problem: Acute Rehab PT Goals(only PT should resolve) Goal: Pt Will Go Supine/Side To Sit Outcome: Progressing Flowsheets (Taken 02/29/2024 1228) Pt will go Supine/Side to Sit: with minimal assist Goal: Patient Will Transfer Sit To/From Stand Outcome: Progressing Flowsheets (Taken 02/29/2024 1228) Patient will transfer sit to/from stand: with minimal assist Goal: Pt Will Transfer Bed To Chair/Chair To Bed Outcome: Progressing Flowsheets (Taken 02/29/2024 1228) Pt will Transfer Bed to Chair/Chair to Bed:  with mod assist  with min assist Goal: Pt Will Ambulate Outcome: Progressing Flowsheets (Taken 02/29/2024 1228) Pt will Ambulate:  15 feet  with moderate assist  with rolling walker   12:29 PM, 02/29/24 Lynwood Music, MPT Physical Therapist with [facility] 336 [phone] office [phone] mobile phone  "

## 2024-02-29 NOTE — ED Provider Notes (Signed)
 Emergency Medicine Observation Re-evaluation Note  Erin Wall is a 79 y.o. female, seen on rounds today.  Pt initially presented to the ED for complaints of Fall Currently, the patient is awake and alert.  She has a hx of dementia and fell yesterday.  She is still unable to ambulate and family is unable to care for her.  Plan is for PT and TOC eval.  Physical Exam  BP (!) 165/94   Pulse 95   Temp 97.9 F (36.6 C) (Axillary)   Resp 18   Ht 5' 10 (1.778 m)   SpO2 97%   BMI 28.55 kg/m  Physical Exam General: awake and alert Cardiac: rr Lungs: clear Psych: calm  ED Course / MDM  EKG:   I have reviewed the labs performed to date as well as medications administered while in observation.  Recent changes in the last 24 hours include ED eval.  Plan  Current plan is for PT/TOC eval.    Dean Clarity, MD 02/29/24 [phone]

## 2024-02-29 NOTE — Evaluation (Signed)
 Occupational Therapy Evaluation Patient Details Name: Erin Wall MRN: [id number] DOB: 07-24-1945 Today's Date: 02/29/2024   History of Present Illness   Erin Wall is a 79 y.o. female, seen on rounds today.  Pt initially presented to the ED for complaints of Fall  Currently, the patient is awake and alert.  She has a hx of dementia and fell yesterday.  She is still unable to ambulate and family is unable to care for her.  Plan is for PT and TOC eval. (per MD)     Clinical Impressions Pt very confused but pleasant with significant difficulty following commands. No family present to provide history or to assist in conveying directions to the patient. Difficult to assess B UE but pt was able to maintain grasp on RW. Mod to max A for bed mobility and max A for sit to stand with RW. Pt demonstrates need for max A for lower body ADL's and min to mod A for most upper body ADL's. Pt limited by severe dementia on top of any physical deficits. Pt left in the bed with call bell within reach and bed alarm set. Pt will benefit from continued OT in the hospital to increase strength, balance, and endurance for safe ADL's.        If plan is discharge home, recommend the following:   A lot of help with walking and/or transfers;A lot of help with bathing/dressing/bathroom;Assistance with cooking/housework;Assist for transportation;Help with stairs or ramp for entrance;Direct supervision/assist for medications management;Direct supervision/assist for financial management;Assistance with feeding;Supervision due to cognitive status     Functional Status Assessment   Patient has had a recent decline in their functional status and demonstrates the ability to make significant improvements in function in a reasonable and predictable amount of time.     Equipment Recommendations   None recommended by OT             Precautions/Restrictions   Precautions Precautions: Fall Recall of  Precautions/Restrictions: Impaired Restrictions Weight Bearing Restrictions Per Provider Order: No     Mobility Bed Mobility Overal bed mobility: Needs Assistance Bed Mobility: Supine to Sit, Sit to Supine     Supine to sit: Mod assist, Max assist Sit to supine: Mod assist   General bed mobility comments: Much cuing due to cognitive deficits; labored movement.    Transfers Overall transfer level: Needs assistance Equipment used: Rolling walker (2 wheels) Transfers: Sit to/from Stand             General transfer comment: Max A for sit to stand by physical therapist at EOB. Pt unable to take steps to the chair today despite attempts and cuing to do so.      Balance Overall balance assessment: Needs assistance Sitting-balance support: No upper extremity supported, Feet supported Sitting balance-Leahy Scale: Fair Sitting balance - Comments: fair to good seated at EOB.   Standing balance support: Bilateral upper extremity supported, During functional activity, Reliant on assistive device for balance Standing balance-Leahy Scale: Poor Standing balance comment: using RW                           ADL either performed or assessed with clinical judgement   ADL Overall ADL's : Needs assistance/impaired Eating/Feeding: Set up;Modified independent;Sitting   Grooming: Minimal assistance;Sitting   Upper Body Bathing: Minimal assistance;Moderate assistance;Sitting   Lower Body Bathing: Maximal assistance;Sitting/lateral leans   Upper Body Dressing : Minimal assistance;Moderate assistance;Sitting   Lower Body Dressing:  Maximal assistance;Sitting/lateral leans Lower Body Dressing Details (indicate cue type and reason): Assisted to don shoes at EOB today. Toilet Transfer: Maximal Dentist Details (indicate cue type and reason): Only partially simulated by sit to stand from EOB. Cognition likley limiting mobility as well as physical  deficits. Toileting- Clothing Manipulation and Hygiene: Maximal assistance;Total assistance;Bed level         General ADL Comments: Cognition likley limiting mobility as well as physical deficits.     Vision Patient Visual Report:  (Unsure of baseline vision.) Vision Assessment?:  (No obvious impairements noted.)     Perception Perception: Not tested       Praxis Praxis: Not tested       Pertinent Vitals/Pain Pain Assessment Pain Assessment: Faces Faces Pain Scale: No hurt     Extremity/Trunk Assessment Upper Extremity Assessment Upper Extremity Assessment: Difficult to assess due to impaired cognition (Unable to fully assess due to pt's cognition.)   Lower Extremity Assessment Lower Extremity Assessment: Defer to PT evaluation   Cervical / Trunk Assessment Cervical / Trunk Assessment: Kyphotic   Communication Communication Communication: Impaired Factors Affecting Communication: Other (comment) (Difficulty having coherent converstaions on any topic.)   Cognition Arousal: Alert Behavior During Therapy: WFL for tasks assessed/performed Cognition: History of cognitive impairments, Cognition impaired   Orientation impairments: Place, Time, Situation       Executive functioning impairment (select all impairments): Sequencing, Initiation OT - Cognition Comments: Pt appears to have fairly severe dementia as noted by inability to provide history or engage in coherent conversation.                 Following commands: Impaired Following commands impaired: Follows one step commands inconsistently     Cueing  General Comments   Cueing Techniques: Verbal cues;Tactile cues;Visual cues;Gestural cues                 Home Living Family/patient expects to be discharged to:: Unsure                                        Prior Functioning/Environment Prior Level of Function : Patient poor historian/Family not available              Mobility Comments: Unsure of baseline mobility. No family present. ADLs Comments: Unsure at this time.    OT Problem List: Decreased strength;Decreased activity tolerance;Decreased range of motion;Impaired balance (sitting and/or standing);Decreased cognition;Decreased safety awareness;Decreased knowledge of use of DME or AE   OT Treatment/Interventions: Self-care/ADL training;Therapeutic exercise;DME and/or AE instruction;Therapeutic activities;Patient/family education;Balance training;Cognitive remediation/compensation      OT Goals(Current goals can be found in the care plan section)   Acute Rehab OT Goals Patient Stated Goal: None stated. OT Goal Formulation: Patient unable to participate in goal setting Time For Goal Achievement: 03/14/24 Potential to Achieve Goals: Fair   OT Frequency:  Min 2X/week    Co-evaluation PT/OT/SLP Co-Evaluation/Treatment: Yes Reason for Co-Treatment: To address functional/ADL transfers   OT goals addressed during session: ADL's and self-care      AM-PAC OT 6 Clicks Daily Activity     Outcome Measure Help from another person eating meals?: A Little Help from another person taking care of personal grooming?: A Little Help from another person toileting, which includes using toliet, bedpan, or urinal?: A Lot Help from another person bathing (including washing, rinsing, drying)?: A Lot Help from another person to put  on and taking off regular upper body clothing?: A Little Help from another person to put on and taking off regular lower body clothing?: A Lot 6 Click Score: 15   End of Session Equipment Utilized During Treatment: Rolling walker (2 wheels);Gait belt Nurse Communication: Mobility status  Activity Tolerance: Patient tolerated treatment well Patient left: in bed;with call bell/phone within reach;with bed alarm set  OT Visit Diagnosis: Unsteadiness on feet (R26.81);Other abnormalities of gait and mobility (R26.89);Muscle weakness  (generalized) (M62.81);History of falling (Z91.81);Other symptoms and signs involving cognitive function                Time: 9165-9140 OT Time Calculation (min): 25 min Charges:  OT General Charges $OT Visit: 1 Visit OT Evaluation $OT Eval Low Complexity: 1 Low  Sheryll Dymek OT, MOT  Jayson Person 02/29/2024, 10:39 AM

## 2024-02-29 NOTE — ED Notes (Signed)
 Pt placed on bed alarm monitor.

## 2024-02-29 NOTE — NC FL2 (Signed)
 " [place]  MEDICAID [id number] LEVEL OF CARE FORM     IDENTIFICATION  Patient Name: Erin Wall Birthdate: Aug 23, 1945 Sex: female Admission Date (Current Location): 02/28/2024  [facility] and [place] Number:  [employer] and Address:  [facility],  [address], [place] [address]      Provider Number: [id number]  Attending Physician Name and Address:  Dean Clarity, MD  Relative Name and Phone Number:  Holli Single ( sister) [phone]    Current Level of Care: SNF Recommended Level of Care: Skilled Nursing Facility Prior Approval Number:    Date Approved/Denied:   PASRR Number:    Discharge Plan: SNF    Current Diagnoses: Patient Active Problem List   Diagnosis Date Noted   Frequent falls 02/20/2024   Recurrent UTI 01/15/2024   Urinary incontinence 03/20/2022   Low back pain 03/20/2022   Alzheimer disease (HCC) 05/27/2020   History of DVT of lower extremity 05/27/2020   S/P hysterectomy 05/27/2020   Unilateral primary osteoarthritis, right knee 01/17/2019   Hyperlipidemia 05/09/2012   Hypertension 05/09/2012    Orientation RESPIRATION BLADDER Height & Weight     Self  Normal Incontinent Weight:   Height:  5' 10 (177.8 cm)  BEHAVIORAL SYMPTOMS/MOOD NEUROLOGICAL BOWEL NUTRITION STATUS      Incontinent Diet (Heart - See After visit summary)  AMBULATORY STATUS COMMUNICATION OF NEEDS Skin   Extensive Assist Verbally Normal                       Personal Care Assistance Level of Assistance  Bathing, Feeding, Dressing Bathing Assistance: Maximum assistance Feeding assistance: Limited assistance Dressing Assistance: Maximum assistance     Functional Limitations Info  Sight, Hearing, Speech Sight Info: Adequate Hearing Info: Adequate Speech Info: Adequate    SPECIAL CARE FACTORS FREQUENCY  PT (By licensed PT), OT (By licensed OT)     PT Frequency: 5 x a week OT Frequency: 5 x a week            Contractures  Contractures Info: Not present    Additional Factors Info  Code Status, Allergies, Psychotropic Code Status Info: FULL Allergies Info: Pravachol , Shellfish allergy, and brexpiprazole  Psychotropic Info: Mirtazapine          Current Medications (02/29/2024):  This is the current hospital active medication list Current Facility-Administered Medications  Medication Dose Route Frequency Provider Last Rate Last Admin   acetaminophen  (TYLENOL ) tablet 650 mg  650 mg Oral Q4H PRN Raford Lenis, MD       cephALEXin  (KEFLEX ) capsule 250 mg  250 mg Oral Daily Raford Lenis, MD       mirtazapine  (REMERON ) tablet 30 mg  30 mg Oral QHS Raford Lenis, MD       rivaroxaban  (XARELTO ) tablet 20 mg  20 mg Oral Q supper Raford Lenis, MD       Current Outpatient Medications  Medication Sig Dispense Refill   cephALEXin  (KEFLEX ) 250 MG capsule Take 1 capsule (250 mg total) by mouth daily. For Prophylaxis. To be started after 1 week course of Keflex . 90 capsule 1   EPINEPHrine  0.3 mg/0.3 mL IJ SOAJ injection Inject 0.3 mLs (0.3 mg total) into the muscle as needed for anaphylaxis. 1 each 0   LORazepam  (ATIVAN ) 0.5 MG tablet Take 1 tablet by mouth twice daily as needed for anxiety 60 tablet 0   mirtazapine  (REMERON ) 30 MG tablet TAKE 1 TABLET BY MOUTH AT BEDTIME 90 tablet 0  XARELTO  20 MG TABS tablet TAKE 1 TABLET BY MOUTH ONCE DAILY WITH SUPPER 90 tablet 0     Discharge Medications: Please see after visit summary for a list of discharge medications.  Relevant Imaging Results:  Relevant Lab Results:   Additional Information SS# [id number]  Noreen KATHEE Pinal, LCSWA     "

## 2024-03-01 NOTE — ED Provider Notes (Signed)
 Emergency Medicine Observation Re-evaluation Note  Erin Wall is a 79 y.o. female, seen on rounds today.  Pt initially presented to the ED for complaints of Fall Currently, the patient is resting comfortably. No acute events overnight.   Physical Exam  BP 114/75   Pulse 94   Temp 98.6 F (37 C) (Oral)   Resp 16   Ht 5' 10 (1.778 m)   SpO2 95%   BMI 28.55 kg/m  Physical Exam General: Calm, cooperative  Lungs: No respiratory distress Psych: calm, cooperative  ED Course / MDM  EKG:   I have reviewed the labs performed to date as well as medications administered while in observation.  Recent changes in the last 24 hours include patient seen by PT/OT  Plan  Current plan is for [facility] consult for placement     Gennaro Duwaine CROME, DO 03/01/24 [id number]

## 2024-03-02 MED ORDER — RIVAROXABAN 20 MG PO TABS
20.0000 mg | ORAL_TABLET | Freq: Every day | ORAL | Status: DC
  Administered 2024-03-02 – 2024-03-04 (×3): 20 mg via ORAL
  Filled 2024-03-02 (×3): qty 1

## 2024-03-02 NOTE — ED Provider Notes (Signed)
 Emergency Medicine Observation Re-evaluation Note  Erin Wall is a 79 y.o. female, seen on rounds today.  Pt initially presented to the ED for complaints of Fall Currently, the patient is resting comfortably. No acute events overnight.   Physical Exam  BP 120/72   Pulse 96   Temp 98.4 F (36.9 C) (Oral)   Resp 16   Ht 5' 10 (1.778 m)   SpO2 97%   BMI 28.55 kg/m  Physical Exam General: NAD Lungs: No respiratory distress Psych: Calm, cooperative  ED Course / MDM  EKG:   I have reviewed the labs performed to date as well as medications administered while in observation.  Recent changes in the last 24 hours include none.  Plan  Current plan is for placement     Erin Warmoth L, DO 03/02/24 0700

## 2024-03-03 NOTE — NC FL2 (Signed)
 " [place]  MEDICAID FL2 LEVEL OF CARE FORM     IDENTIFICATION  Patient Name: Erin Wall Birthdate: 14-Nov-1945 Sex: female Admission Date (Current Location): 02/28/2024  [facility] and [place] Number:  [employer] and Address:  [facility],  [address], [place] [address]      Provider Number: [id number]  Attending Physician Name and Address:  Patsey Lot, MD  Relative Name and Phone Number:  Holli Single ( sister) [phone]    Current Level of Care: Hospital  Recommended Level of Care: Skilled Nursing Facility Prior Approval Number:    Date Approved/Denied:   PASRR Number:    Discharge Plan: SNF    Current Diagnoses: Patient Active Problem List   Diagnosis Date Noted   Frequent falls 02/20/2024   Recurrent UTI 01/15/2024   Urinary incontinence 03/20/2022   Low back pain 03/20/2022   Alzheimer disease (HCC) 05/27/2020   History of DVT of lower extremity 05/27/2020   S/P hysterectomy 05/27/2020   Unilateral primary osteoarthritis, right knee 01/17/2019   Hyperlipidemia 05/09/2012   Hypertension 05/09/2012    Orientation RESPIRATION BLADDER Height & Weight     Self  Normal Incontinent Weight:   Height:  5' 10 (177.8 cm)  BEHAVIORAL SYMPTOMS/MOOD NEUROLOGICAL BOWEL NUTRITION STATUS      Incontinent Diet (Heart - See After visit summary)  AMBULATORY STATUS COMMUNICATION OF NEEDS Skin   Extensive Assist Verbally Normal                       Personal Care Assistance Level of Assistance  Bathing, Feeding, Dressing Bathing Assistance: Maximum assistance Feeding assistance: Limited assistance Dressing Assistance: Maximum assistance     Functional Limitations Info  Sight, Hearing, Speech Sight Info: Adequate Hearing Info: Adequate Speech Info: Adequate    SPECIAL CARE FACTORS FREQUENCY  PT (By licensed PT), OT (By licensed OT)     PT Frequency: 5 x a week OT Frequency: 5 x a week            Contractures  Contractures Info: Not present    Additional Factors Info  Code Status, Allergies, Psychotropic Code Status Info: FULL Allergies Info: Pravachol , Shellfish allergy, and brexpiprazole  Psychotropic Info: Mirtazapine          Current Medications (03/03/2024):  This is the current hospital active medication list Current Facility-Administered Medications  Medication Dose Route Frequency Provider Last Rate Last Admin   acetaminophen  (TYLENOL ) tablet 650 mg  650 mg Oral Q4H PRN Raford Lenis, MD       cephALEXin  (KEFLEX ) capsule 250 mg  250 mg Oral Daily Raford Lenis, MD   250 mg at 03/02/24 1122   mirtazapine  (REMERON ) tablet 30 mg  30 mg Oral QHS Raford Lenis, MD   30 mg at 03/02/24 2151   rivaroxaban  (XARELTO ) tablet 20 mg  20 mg Oral Q lunch Kammerer, Megan L, DO   20 mg at 03/02/24 1131   Current Outpatient Medications  Medication Sig Dispense Refill   cefpodoxime (VANTIN) 200 MG tablet Take 200 mg by mouth 2 (two) times daily.     mirtazapine  (REMERON ) 30 MG tablet TAKE 1 TABLET BY MOUTH AT BEDTIME 90 tablet 0   cephALEXin  (KEFLEX ) 250 MG capsule Take 1 capsule (250 mg total) by mouth daily. For Prophylaxis. To be started after 1 week course of Keflex . 90 capsule 1   EPINEPHrine  0.3 mg/0.3 mL IJ SOAJ injection Inject 0.3 mLs (0.3 mg total) into the muscle as  needed for anaphylaxis. 1 each 0   LORazepam  (ATIVAN ) 0.5 MG tablet Take 1 tablet by mouth twice daily as needed for anxiety 60 tablet 0   XARELTO  20 MG TABS tablet TAKE 1 TABLET BY MOUTH ONCE DAILY WITH SUPPER 90 tablet 0     Discharge Medications: Please see after visit summary for a list of discharge medications.  Relevant Imaging Results:  Relevant Lab Results:   Additional Information SS# [id number]  Noreen KATHEE Pinal, LCSWA     "

## 2024-03-03 NOTE — Discharge Instructions (Addendum)
 Fortunately there are no fractures related to your fall.  There is a abnormal lesion on the right ilium that we will need an outpatient bone scan or MRI, which your doctor can help set up.  Return to the ER for any new or worsening symptoms.

## 2024-03-03 NOTE — ED Provider Notes (Signed)
" °  Physical Exam  BP (!) 147/91 (BP Location: Left Arm)   Pulse 83   Temp 97.8 F (36.6 C) (Axillary)   Resp 16   Ht 5' 10 (1.778 m)   SpO2 95%   BMI 28.55 kg/m   Physical Exam  Procedures  Procedures  ED Course / MDM    Medical Decision Making Amount and/or Complexity of Data Reviewed Labs: ordered. Radiology: ordered.  Risk OTC drugs. Prescription drug management.   Patient pending placement.  No issues overnight.       Patsey Lot, MD 03/03/24 [phone]  "

## 2024-03-03 NOTE — ED Notes (Signed)
 CSW had to redo patient FL2 for PASRR and resend. CSW also reached out to [place] today about patient Erin Wall and Erin Wall shared that Erin Wall is still pending. Attempted to reach sister, leave a HIPAA VM. Will continue to follow.

## 2024-03-04 MED ORDER — LORAZEPAM 0.5 MG PO TABS: 0.5000 mg | ORAL_TABLET | Freq: Two times a day (BID) | ORAL | 0 refills | Status: AC | PRN

## 2024-03-04 MED ORDER — LORAZEPAM 0.5 MG PO TABS
0.5000 mg | ORAL_TABLET | Freq: Two times a day (BID) | ORAL | Status: DC | PRN
  Administered 2024-03-04: 0.5 mg via ORAL
  Filled 2024-03-04: qty 1

## 2024-03-04 NOTE — ED Notes (Addendum)
 CSW checked patient PASRR this morning and it is still pending. CSW will continue to follow.   Addendum 11:27 AM   Patient PASRR came back , facility made aware, pending AVS for facility and they will then send Room and report number. Nurse and MD made aware.    Addendum 12:03 PM   CSW spoke with patient sister and made her aware that patient will DC to [facility] rehab and transportation will be arranged for her after nurse calls report. Sister stated that she will run home to get patient some clothes. Nurse provided with room and report number. CSW signing off.

## 2024-03-04 NOTE — ED Notes (Signed)
Report given to Facility.

## 2024-03-04 NOTE — Progress Notes (Signed)
 Physical Therapy Treatment Patient Details Name: Erin Wall MRN: [id number] DOB: 1945-10-22 Today's Date: 03/04/2024   History of Present Illness Erin Wall is a 79 y.o. female, seen on rounds today.  Pt initially presented to the ED for complaints of Fall  Currently, the patient is awake and alert.  She has a hx of dementia and fell yesterday.  She is still unable to ambulate and family is unable to care for her.  Plan is for PT and TOC eval.    PT Comments  Patient agreeable for therapy, but poor carryover for following instructions due to dementia. Patient demonstrates slow labored movement for sitting up at bedside, once seated able to maintain sitting balance at bedside, tolerated standing with RW for up to 1-2 minutes with Max assist due to BLE weakness with frequent buckling of knees, unable to take steps or transfer using RW and required Max assist stand pivot with knees blocked to transfer to chair. Patient tolerated sitting up in chair after therapy - nursing staff aware. Patient will benefit from continued skilled physical therapy in hospital and recommended venue below to increase strength, balance, endurance for safe ADLs and gait.       If plan is discharge home, recommend the following: A lot of help with walking and/or transfers;A lot of help with bathing/dressing/bathroom;Assistance with cooking/housework;Direct supervision/assist for medications management;Direct supervision/assist for financial management;Assist for transportation;Help with stairs or ramp for entrance;Supervision due to cognitive status   Can travel by private vehicle     No  Equipment Recommendations  None recommended by PT    Recommendations for Other Services       Precautions / Restrictions Precautions Precautions: Fall Recall of Precautions/Restrictions: Impaired Restrictions Weight Bearing Restrictions Per Provider Order: No     Mobility  Bed Mobility Overal bed mobility: Needs  Assistance Bed Mobility: Supine to Sit     Supine to sit: Max assist     General bed mobility comments: increased time, labored movement    Transfers Overall transfer level: Needs assistance Equipment used: Rolling walker (2 wheels), 1 person hand held assist Transfers: Sit to/from Stand, Bed to chair/wheelchair/BSC Sit to Stand: Max assist Stand pivot transfers: Max assist         General transfer comment: Patient stood with RW, but uanble to use for transferring to chair due to weaknes, required Max assist with knees blocked to transfer to chair    Ambulation/Gait                   Stairs             Wheelchair Mobility     Tilt Bed    Modified Rankin (Stroke Patients Only)       Balance Overall balance assessment: Needs assistance Sitting-balance support: No upper extremity supported, Feet supported Sitting balance-Leahy Scale: Fair Sitting balance - Comments: fair/good seated at EOB   Standing balance support: Bilateral upper extremity supported, Reliant on assistive device for balance, During functional activity Standing balance-Leahy Scale: Poor Standing balance comment: using RW, hand held assist                            Communication Communication Communication: Impaired Factors Affecting Communication: Difficulty expressing self  Cognition Arousal: Alert Behavior During Therapy: Anxious, WFL for tasks assessed/performed   PT - Cognitive impairments: History of cognitive impairments, Initiation, Problem solving  PT - Cognition Comments: requires repeated verbal/tactile cueing Following commands: Impaired Following commands impaired: Follows one step commands inconsistently    Cueing Cueing Techniques: Verbal cues, Tactile cues, Gestural cues  Exercises      General Comments        Pertinent Vitals/Pain Pain Assessment Pain Assessment: No/denies pain    Home Living                           Prior Function            PT Goals (current goals can now be found in the care plan section) Acute Rehab PT Goals Patient Stated Goal: return homet PT Goal Formulation: With patient Time For Goal Achievement: 03/14/24 Potential to Achieve Goals: Good Progress towards PT goals: Progressing toward goals    Frequency    Min 2X/week      PT Plan      Co-evaluation PT/OT/SLP Co-Evaluation/Treatment: Yes Reason for Co-Treatment: To address functional/ADL transfers PT goals addressed during session: Mobility/safety with mobility;Balance;Proper use of DME OT goals addressed during session: ADL's and self-care      AM-PAC PT 6 Clicks Mobility   Outcome Measure  Help needed turning from your back to your side while in a flat bed without using bedrails?: A Lot Help needed moving from lying on your back to sitting on the side of a flat bed without using bedrails?: A Lot Help needed moving to and from a bed to a chair (including a wheelchair)?: A Lot Help needed standing up from a chair using your arms (e.g., wheelchair or bedside chair)?: A Lot Help needed to walk in hospital room?: Total Help needed climbing 3-5 steps with a railing? : Total 6 Click Score: 10    End of Session   Activity Tolerance: Patient tolerated treatment well;Patient limited by fatigue Patient left: in chair;with call bell/phone within reach;with chair alarm set Nurse Communication: Mobility status PT Visit Diagnosis: Unsteadiness on feet (R26.81);Other abnormalities of gait and mobility (R26.89);Muscle weakness (generalized) (M62.81)     Time: [id number] PT Time Calculation (min) (ACUTE ONLY): 23 min  Charges:    $Therapeutic Activity: 23-37 mins PT General Charges $$ ACUTE PT VISIT: 1 Visit                     1:50 PM, 03/04/24 Lynwood Music, MPT Physical Therapist with [facility] [phone] office [phone] mobile phone

## 2024-03-04 NOTE — ED Notes (Signed)
 Pt has been repositioned and cleaned. New pure wick has been placed.

## 2024-03-04 NOTE — Progress Notes (Signed)
 Occupational Therapy Treatment Patient Details Name: Erin Wall MRN: [id number] DOB: 05-Sep-1945 Today's Date: 03/04/2024   History of present illness Erin Wall is a 79 y.o. female, seen on rounds today.  Pt initially presented to the ED for complaints of Fall  Currently, the patient is awake and alert.  She has a hx of dementia and fell yesterday.  She is still unable to ambulate and family is unable to care for her.  Plan is for PT and TOC eval.   OT comments  Pt agreeable to OT and PT co-treatment. Pt still limited by cognition but able to progress to chair transfer with max A without use of AD. Max A also needed for supine to sit bed mobility. Pt demonstrated near full A/ROM and AA/ROM for B UE shoulder flexion. Pt still needing max A for lower body dressing based on inability to manage her sock today. Pt left in the chair with NT present. Pt will benefit from continued OT in the hospital to increase strength, balance, and endurance for safe ADL's.         If plan is discharge home, recommend the following:  A lot of help with walking and/or transfers;A lot of help with bathing/dressing/bathroom;Assistance with cooking/housework;Assist for transportation;Help with stairs or ramp for entrance;Direct supervision/assist for medications management;Direct supervision/assist for financial management;Assistance with feeding;Supervision due to cognitive status   Equipment Recommendations  None recommended by OT          Precautions / Restrictions Precautions Precautions: Fall Recall of Precautions/Restrictions: Impaired Restrictions Weight Bearing Restrictions Per Provider Order: No       Mobility Bed Mobility Overal bed mobility: Needs Assistance Bed Mobility: Supine to Sit     Supine to sit: Max assist     General bed mobility comments: Labored effort; assist to move  B LE to EOB and pull trunk to upright position.    Transfers Overall transfer level: Needs  assistance Equipment used: Rolling walker (2 wheels) Transfers: Sit to/from Stand, Bed to chair/wheelchair/BSC Sit to Stand: Max assist Stand pivot transfers: Max assist         General transfer comment: EOB to chair without AD. One sit to stand with RW with max A. Much time and effort to discuss and try to get pt to attempt transfer.     Balance Overall balance assessment: Needs assistance Sitting-balance support: No upper extremity supported, Feet supported Sitting balance-Leahy Scale: Fair Sitting balance - Comments: fair to good seated at EOB.   Standing balance support: Bilateral upper extremity supported, During functional activity, Reliant on assistive device for balance Standing balance-Leahy Scale: Poor Standing balance comment: using RW                           ADL either performed or assessed with clinical judgement   ADL Overall ADL's : Needs assistance/impaired                     Lower Body Dressing: Maximal assistance;Sitting/lateral leans Lower Body Dressing Details (indicate cue type and reason): Pt asked to try doffing sock but struggled to follow this commands while seated at EOB.               General ADL Comments: Pt still limited by cognition.    Extremity/Trunk Assessment Upper Extremity Assessment Upper Extremity Assessment: Generalized weakness (Pt noted to have near full A/ROM and AA/ROM today for shoulder flexion. Cognition still limits full  assessment.)                             Communication Communication Communication: Impaired Factors Affecting Communication: Difficulty expressing self   Cognition Arousal: Alert Behavior During Therapy: WFL for tasks assessed/performed, Anxious Cognition: History of cognitive impairments, Cognition impaired                               Following commands: Impaired Following commands impaired: Follows one step commands inconsistently      Cueing    Cueing Techniques: Verbal cues, Tactile cues, Gestural cues  Exercises                   Pertinent Vitals/ Pain       Pain Assessment Pain Assessment: No/denies pain                                                          Frequency  Min 2X/week        Progress Toward Goals  OT Goals(current goals can now be found in the care plan section)  Progress towards OT goals: Progressing toward goals  Acute Rehab OT Goals Patient Stated Goal: None stated. OT Goal Formulation: Patient unable to participate in goal setting Time For Goal Achievement: 03/14/24 Potential to Achieve Goals: Fair ADL Goals Pt Will Perform Eating: with modified independence Pt Will Perform Grooming: with supervision Pt Will Perform Upper Body Dressing: with supervision Pt Will Perform Lower Body Dressing: with supervision Pt Will Transfer to Toilet: with contact guard assist;stand pivot transfer Pt Will Perform Toileting - Clothing Manipulation and hygiene: with supervision;with contact guard assist Pt/caregiver will Perform Home Exercise Program: Increased strength;Increased ROM;Both right and left upper extremity;With Supervision  Plan      Co-evaluation    PT/OT/SLP Co-Evaluation/Treatment: Yes Reason for Co-Treatment: To address functional/ADL transfers   OT goals addressed during session: ADL's and self-care                          End of Session Equipment Utilized During Treatment: Rolling walker (2 wheels);Gait belt  OT Visit Diagnosis: Unsteadiness on feet (R26.81);Other abnormalities of gait and mobility (R26.89);Muscle weakness (generalized) (M62.81);History of falling (Z91.81);Other symptoms and signs involving cognitive function   Activity Tolerance Patient tolerated treatment well   Patient Left in chair;with call bell/phone within reach;with nursing/sitter in room   Nurse Communication Other (comment) (NT in room with pt at end of  session.)        Time: [phone] OT Time Calculation (min): 25 min  Charges: OT General Charges $OT Visit: 1 Visit OT Treatments $Self Care/Home Management : 8-22 mins (One unit taken due to co-treat with PT.)  JAYSON PERSON OT, MOT   Jayson Person 03/04/2024, 12:05 PM

## 2024-03-04 NOTE — ED Provider Notes (Addendum)
 Emergency Medicine Observation Re-evaluation Note  Erin Wall is a 79 y.o. female, seen on rounds today.  Pt initially presented to the ED for complaints of Fall Currently, the patient is resting.  Physical Exam  BP 118/60   Pulse (!) 103   Temp 97.8 F (36.6 C) (Axillary)   Resp 16   Ht 5' 10 (1.778 m)   SpO2 94%   BMI 28.55 kg/m  Physical Exam General: no distress Lungs: normal effort Psych: pleasantly confused, no agitation  ED Course / MDM  EKG:   I have reviewed the labs performed to date as well as medications administered while in observation.  No recent changes in the last 24 hours.  Plan  Current plan is for placement.    Freddi Hamilton, MD 03/04/24 [phone]  Patient has been accepted to [facility] rehab for discharge.  They are requesting her Ativan  prescription, it seems like she has been on as needed Ativan  from her PCP.  Will give a brief course to ensure she has this until they can follow-up with PCP.   Freddi Hamilton, MD 03/04/24 1247

## 2024-05-20 ENCOUNTER — Ambulatory Visit: Admitting: Physician Assistant
